# Patient Record
Sex: Female | Born: 1945 | Race: White | Hispanic: No | Smoking: Former smoker
Health system: Southern US, Community
[De-identification: ages and names within clinical notes are randomized; demographics above are authoritative.]

## PROBLEM LIST (undated history)

## (undated) DIAGNOSIS — E739 Lactose intolerance, unspecified: Secondary | ICD-10-CM

## (undated) DIAGNOSIS — Z8719 Personal history of other diseases of the digestive system: Secondary | ICD-10-CM

## (undated) DIAGNOSIS — K589 Irritable bowel syndrome without diarrhea: Secondary | ICD-10-CM

## (undated) DIAGNOSIS — Z9221 Personal history of antineoplastic chemotherapy: Secondary | ICD-10-CM

## (undated) DIAGNOSIS — C801 Malignant (primary) neoplasm, unspecified: Secondary | ICD-10-CM

## (undated) DIAGNOSIS — C50411 Malignant neoplasm of upper-outer quadrant of right female breast: Principal | ICD-10-CM

## (undated) DIAGNOSIS — C50919 Malignant neoplasm of unspecified site of unspecified female breast: Secondary | ICD-10-CM

## (undated) DIAGNOSIS — Z923 Personal history of irradiation: Secondary | ICD-10-CM

## (undated) HISTORY — DX: Malignant neoplasm of upper-outer quadrant of right female breast: C50.411

## (undated) HISTORY — PX: BREAST SURGERY: SHX581

## (undated) HISTORY — DX: Malignant neoplasm of unspecified site of unspecified female breast: C50.919

## (undated) HISTORY — PX: COLONOSCOPY: SHX5424

---

## 1998-04-12 ENCOUNTER — Encounter: Payer: Self-pay | Admitting: Family Medicine

## 1998-04-12 ENCOUNTER — Ambulatory Visit (HOSPITAL_COMMUNITY): Admission: RE | Admit: 1998-04-12 | Discharge: 1998-04-12 | Payer: Self-pay | Admitting: Obstetrics and Gynecology

## 2001-03-22 ENCOUNTER — Encounter: Payer: Self-pay | Admitting: Family Medicine

## 2001-03-22 ENCOUNTER — Ambulatory Visit (HOSPITAL_COMMUNITY): Admission: RE | Admit: 2001-03-22 | Discharge: 2001-03-22 | Payer: Self-pay | Admitting: Family Medicine

## 2004-12-25 ENCOUNTER — Encounter: Admission: RE | Admit: 2004-12-25 | Discharge: 2005-01-01 | Payer: Self-pay | Admitting: Family Medicine

## 2009-02-14 ENCOUNTER — Encounter: Admission: RE | Admit: 2009-02-14 | Discharge: 2009-03-14 | Payer: Self-pay | Admitting: Family Medicine

## 2010-01-14 ENCOUNTER — Encounter: Admission: RE | Admit: 2010-01-14 | Discharge: 2010-01-14 | Payer: Self-pay | Admitting: Family Medicine

## 2010-01-20 ENCOUNTER — Ambulatory Visit: Payer: Self-pay | Admitting: Pulmonary Disease

## 2010-01-20 DIAGNOSIS — R930 Abnormal findings on diagnostic imaging of skull and head, not elsewhere classified: Secondary | ICD-10-CM

## 2010-01-20 DIAGNOSIS — J019 Acute sinusitis, unspecified: Secondary | ICD-10-CM

## 2010-01-20 DIAGNOSIS — J309 Allergic rhinitis, unspecified: Secondary | ICD-10-CM | POA: Insufficient documentation

## 2010-07-06 ENCOUNTER — Encounter: Payer: Self-pay | Admitting: Pulmonary Disease

## 2010-07-06 ENCOUNTER — Encounter: Payer: Self-pay | Admitting: Obstetrics and Gynecology

## 2010-07-15 NOTE — Assessment & Plan Note (Signed)
Summary: CT-R LUNG APEX-NOD SCARRING//kp   Visit Type:  Initial Consult Copy to:  Dr. Vianne Bulls Primary Duanne Duchesne/Referring Malaijah Houchen:  Dr. Vianne Bulls  CC:  Pulmonary consult for abnormal CT chest.  The patient c/o prod cough with clear mucus. Worse in the mornings..  History of Present Illness: 65 yo female with abnormal CT chest.  She developed a sinus infection about 2 or 3 weeks ago.  She initially felt like she was getting a cold.  She had sinus pressure and drainage.  She also had a cough with chest congestion.  She was treated with augmentin and prednisone.  She initally was given zithromax, but this caused stomach upset.  She was also getting some noise in her chest with her breathing.  Overall she has improved.  She still has some nasal congestion and a nasally voice.  She gets drainage from her sinuses in the morning still.  She still also feels fatigued.  She denies chest pain, fever, epistaxis, hemoptysis, palpitations, headache, weight loss, dysphagia, skin rash, joint pain, or leg swelling.  She had an episode of pneumonia 20 years ago, but denies TB.  There is no prior history of asthma, but she does get allergies with the change of seasons.  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  She denies any recent travel history or sick exposures.  She denies any animal exposures.  She smoked 1/4 pack per day from age 67, and quit in 2008.  She will occasionally still smoke a cigarette, more so since her husband passed away.  She does exercise on a regular basis w/o difficulty.    As a result of her symptoms she had a chest xray and then CT chest.   CT of Chest  Procedure date:  01/14/2010  Findings:       CT CHEST WITH CONTRAST    Technique:  Multidetector CT imaging of the chest was performed   following the standard protocol during bolus administration of   intravenous contrast.    Contrast: 75 ml Omnipaque-300    Comparison: Chest x-ray of  01/08/2010.    Findings: There is biapical pleuroparenchymal scarring which is   more prominent within the right lung apex.  A right apical lesion   is difficult to exclude, with a somewhat nodular component, and   either follow-up CT chest or PET CT would be recommended.  The   remainder of the lungs are well-aerated and no other pulmonary   nodule is seen.  No pleural effusion is noted.    On soft tissue window images the thyroid gland is unremarkable.  No   mediastinal or hilar adenopathy is seen.  The pulmonary arteries   and thoracic aorta opacify with no significant abnormality noted.   The portion of the upper abdomen that is visualized is   unremarkable. No bony abnormality is seen.    IMPRESSION:   Somewhat nodular appearance of the pleuroparenchymal scarring in   the right lung apex.  Cannot exclude right apical lesion.  Consider   either follow-up CT chest in 4 months to assess stability versus   PET CT to assess for metabolic activity.   Preventive Screening-Counseling & Management  Alcohol-Tobacco     Alcohol drinks/day: 2     Alcohol type: wine     Smoking Status: quit     Packs/Day: 0.25     Year Started: 1979     Year Quit: 2008  Current Medications (verified):  1)  Caltrate 600+d 600-400 Mg-Unit Tabs (Calcium Carbonate-Vitamin D) .Marland Kitchen.. 1 By Mouth Two Times A Day  Allergies (verified): 1)  ! Jonne Ply  Past History:  Past Medical History: Seasonal allergies  Past Surgical History: none  Family History: Family History MI/Heart Attack---mother and father Family History C V A / Stroke ---mother  Social History: Patient states former smoker.  Widowed Currently unemployed Lives aloneAlcohol drinks/day:  2 Smoking Status:  quit Packs/Day:  0.25  Review of Systems       The patient complains of productive cough, nasal congestion/difficulty breathing through nose, and sneezing.  The patient denies shortness of breath with activity, shortness of breath at  rest, non-productive cough, coughing up blood, chest pain, irregular heartbeats, acid heartburn, indigestion, loss of appetite, weight change, abdominal pain, difficulty swallowing, sore throat, tooth/dental problems, headaches, itching, ear ache, anxiety, depression, hand/feet swelling, joint stiffness or pain, rash, change in color of mucus, and fever.    Vital Signs:  Patient profile:   65 year old female Height:      66 inches (167.64 cm) Weight:      126 pounds (57.27 kg) BMI:     20.41 O2 Sat:      96 % on Room air Temp:     98.5 degrees F (36.94 degrees C) oral Pulse rate:   66 / minute BP sitting:   140 / 84  (right arm) Cuff size:   regular  Vitals Entered By: Michel Bickers CMA (January 20, 2010 3:24 PM)  O2 Sat at Rest %:  96 O2 Flow:  Room air CC: Pulmonary consult for abnormal CT chest.  The patient c/o prod cough with clear mucus. Worse in the mornings. Is Patient Diabetic? No Comments Medications reviewed with the patient. Daytime phone verified. Michel Bickers CMA  January 20, 2010 3:25 PM   Physical Exam  General:  well developed, well nourished, in no acute distress Eyes:  PERRLA/EOM intact; conjunctiva and sclera clear Ears:  TMs intact and clear with normal canals Nose:  clear discharge, no tenderness Mouth:  no deformity or lesions Neck:  no JVD.   Chest Wall:  no deformities noted Lungs:  clear bilaterally to auscultation and percussion Heart:  regular rate and rhythm, S1, S2 without murmurs, rubs, gallops, or clicks Abdomen:  bowel sounds positive; abdomen soft and non-tender without masses, or organomegaly Msk:  no deformity or scoliosis noted with normal posture Pulses:  pulses normal Extremities:  no clubbing, cyanosis, edema, or deformity noted Neurologic:  normal CN II-XII and strength normal.   Cervical Nodes:  no significant adenopathy Axillary Nodes:  no significant adenopathy Psych:  alert and cooperative; normal mood and affect; normal attention  span and concentration   Impression & Recommendations:  Problem # 1:  CT, CHEST, ABNORMAL (ICD-793.1) She has prior history of smoking.  She has CT chest findings as detailed above.  My suspicion is that this represents a benign lesion.  However, more serious pathology can not be completely excluded.  I do not think she requires biopsy at this time.  I have recommend radiographic monitoring.  Will repeat CT chest w/o contrast in 4 months.  Depending on imaging studies from then will decide if further interventions are needed.  Problem # 2:  SINUSITIS, ACUTE (ICD-461.9) She has slowly resolving sinus symptoms.  Will give her a trial of nasal irrigation and nasonex.  Have give sample of nasonex.  If no improvement, advised to follow up with primary  care for possible ENT evaluation.  I don't think she needs additional antibiotics or prednisone at this time.  Medications Added to Medication List This Visit: 1)  Caltrate 600+d 600-400 Mg-unit Tabs (Calcium carbonate-vitamin d) .Marland Kitchen.. 1 by mouth two times a day 2)  Nasonex 50 Mcg/act Susp (Mometasone furoate) .... Two sprays once daily for one to two weeks, then as needed  Complete Medication List: 1)  Caltrate 600+d 600-400 Mg-unit Tabs (Calcium carbonate-vitamin d) .Marland Kitchen.. 1 by mouth two times a day 2)  Nasonex 50 Mcg/act Susp (Mometasone furoate) .... Two sprays once daily for one to two weeks, then as needed  Other Orders: Consultation Level IV (30865) Radiology Referral (Radiology)  Patient Instructions: 1)  Nasal irrigation (saline nasal rinse) once daily  2)  Nasonex two sprays once daily after nasal irrigation.  Use this once daily for one to two weeks, then as needed  3)  CT chest in 4 months 4)  Follow up after CT chest in 4 months Prescriptions: NASONEX 50 MCG/ACT SUSP (MOMETASONE FUROATE) two sprays once daily for one to two weeks, then as needed  #1 x 3   Entered and Authorized by:   Coralyn Helling MD   Signed by:   Coralyn Helling MD on  01/20/2010   Method used:   Print then Give to Patient   RxID:   [id number]

## 2013-01-15 DIAGNOSIS — J069 Acute upper respiratory infection, unspecified: Secondary | ICD-10-CM | POA: Diagnosis not present

## 2013-01-20 DIAGNOSIS — J40 Bronchitis, not specified as acute or chronic: Secondary | ICD-10-CM | POA: Diagnosis not present

## 2013-03-30 DIAGNOSIS — N61 Mastitis without abscess: Secondary | ICD-10-CM | POA: Diagnosis not present

## 2013-04-10 DIAGNOSIS — Z23 Encounter for immunization: Secondary | ICD-10-CM | POA: Diagnosis not present

## 2013-06-11 DIAGNOSIS — J069 Acute upper respiratory infection, unspecified: Secondary | ICD-10-CM | POA: Diagnosis not present

## 2013-06-11 DIAGNOSIS — R509 Fever, unspecified: Secondary | ICD-10-CM | POA: Diagnosis not present

## 2013-06-16 DIAGNOSIS — J40 Bronchitis, not specified as acute or chronic: Secondary | ICD-10-CM | POA: Diagnosis not present

## 2013-07-26 DIAGNOSIS — J329 Chronic sinusitis, unspecified: Secondary | ICD-10-CM | POA: Diagnosis not present

## 2014-01-08 DIAGNOSIS — H04129 Dry eye syndrome of unspecified lacrimal gland: Secondary | ICD-10-CM | POA: Diagnosis not present

## 2014-01-19 DIAGNOSIS — H109 Unspecified conjunctivitis: Secondary | ICD-10-CM | POA: Diagnosis not present

## 2014-04-13 DIAGNOSIS — Z23 Encounter for immunization: Secondary | ICD-10-CM | POA: Diagnosis not present

## 2014-05-30 DIAGNOSIS — J329 Chronic sinusitis, unspecified: Secondary | ICD-10-CM | POA: Diagnosis not present

## 2014-05-30 DIAGNOSIS — J45909 Unspecified asthma, uncomplicated: Secondary | ICD-10-CM | POA: Diagnosis not present

## 2014-07-11 DIAGNOSIS — B029 Zoster without complications: Secondary | ICD-10-CM | POA: Diagnosis not present

## 2014-07-11 DIAGNOSIS — H6120 Impacted cerumen, unspecified ear: Secondary | ICD-10-CM | POA: Diagnosis not present

## 2014-07-26 SURGERY — Surgical Case
Anesthesia: *Unknown

## 2014-09-18 ENCOUNTER — Other Ambulatory Visit: Payer: Self-pay | Admitting: Family Medicine

## 2014-09-18 DIAGNOSIS — N63 Unspecified lump in unspecified breast: Secondary | ICD-10-CM

## 2014-09-19 ENCOUNTER — Ambulatory Visit
Admission: RE | Admit: 2014-09-19 | Discharge: 2014-09-19 | Disposition: A | Discharge status: Home / Self Care | Payer: Medicare Other | Source: Ambulatory Visit | Attending: Family Medicine | Admitting: Family Medicine

## 2014-09-19 ENCOUNTER — Other Ambulatory Visit: Payer: Self-pay | Admitting: Family Medicine

## 2014-09-19 DIAGNOSIS — N63 Unspecified lump in unspecified breast: Secondary | ICD-10-CM

## 2014-10-04 ENCOUNTER — Ambulatory Visit
Admission: RE | Admit: 2014-10-04 | Discharge: 2014-10-04 | Disposition: A | Discharge status: Home / Self Care | Payer: Medicare Other | Source: Ambulatory Visit | Attending: Family Medicine | Admitting: Family Medicine

## 2014-10-04 ENCOUNTER — Other Ambulatory Visit: Payer: Self-pay | Admitting: Family Medicine

## 2014-10-04 DIAGNOSIS — N63 Unspecified lump in unspecified breast: Secondary | ICD-10-CM

## 2014-10-04 DIAGNOSIS — N6310 Unspecified lump in the right breast, unspecified quadrant: Secondary | ICD-10-CM

## 2014-10-16 ENCOUNTER — Ambulatory Visit
Admission: RE | Admit: 2014-10-16 | Discharge: 2014-10-16 | Disposition: A | Discharge status: Home / Self Care | Payer: Medicare Other | Source: Ambulatory Visit | Attending: Family Medicine | Admitting: Family Medicine

## 2014-10-16 ENCOUNTER — Other Ambulatory Visit: Payer: Self-pay | Admitting: Family Medicine

## 2014-10-16 DIAGNOSIS — N6310 Unspecified lump in the right breast, unspecified quadrant: Secondary | ICD-10-CM

## 2014-10-16 DIAGNOSIS — N63 Unspecified lump in breast: Secondary | ICD-10-CM | POA: Diagnosis not present

## 2014-10-16 DIAGNOSIS — C50811 Malignant neoplasm of overlapping sites of right female breast: Secondary | ICD-10-CM | POA: Diagnosis not present

## 2014-10-16 HISTORY — PX: BREAST BIOPSY: SHX20

## 2014-10-17 ENCOUNTER — Other Ambulatory Visit: Payer: Self-pay | Admitting: Family Medicine

## 2014-10-17 DIAGNOSIS — C50911 Malignant neoplasm of unspecified site of right female breast: Secondary | ICD-10-CM

## 2014-10-19 ENCOUNTER — Encounter: Payer: Self-pay | Admitting: *Deleted

## 2014-10-19 ENCOUNTER — Telehealth: Payer: Self-pay | Admitting: *Deleted

## 2014-10-19 DIAGNOSIS — Z170 Estrogen receptor positive status [ER+]: Secondary | ICD-10-CM

## 2014-10-19 DIAGNOSIS — C50411 Malignant neoplasm of upper-outer quadrant of right female breast: Secondary | ICD-10-CM | POA: Insufficient documentation

## 2014-10-19 HISTORY — DX: Malignant neoplasm of upper-outer quadrant of right female breast: C50.411

## 2014-10-19 NOTE — Telephone Encounter (Signed)
Confirmed [facility] for 10/24/14 at 1230 .  Instructions and contact information given.

## 2014-10-23 ENCOUNTER — Ambulatory Visit
Admission: RE | Admit: 2014-10-23 | Discharge: 2014-10-23 | Disposition: A | Discharge status: Home / Self Care | Payer: Medicare Other | Source: Ambulatory Visit | Attending: Family Medicine | Admitting: Family Medicine

## 2014-10-23 DIAGNOSIS — C50111 Malignant neoplasm of central portion of right female breast: Secondary | ICD-10-CM | POA: Diagnosis not present

## 2014-10-23 DIAGNOSIS — C50911 Malignant neoplasm of unspecified site of right female breast: Secondary | ICD-10-CM

## 2014-10-23 MED ORDER — GADOBENATE DIMEGLUMINE 529 MG/ML IV SOLN
12.0000 mL | Freq: Once | INTRAVENOUS | Status: AC | PRN
  Administered 2014-10-23: 12 mL via INTRAVENOUS

## 2014-10-24 ENCOUNTER — Other Ambulatory Visit: Payer: Self-pay | Admitting: General Surgery

## 2014-10-24 ENCOUNTER — Encounter: Payer: Self-pay | Admitting: Oncology

## 2014-10-24 ENCOUNTER — Encounter: Payer: Self-pay | Admitting: Radiation Oncology

## 2014-10-24 ENCOUNTER — Ambulatory Visit
Admission: RE | Admit: 2014-10-24 | Discharge: 2014-10-24 | Disposition: A | Discharge status: Home / Self Care | Payer: Self-pay | Source: Ambulatory Visit | Attending: Radiation Oncology | Admitting: Radiation Oncology

## 2014-10-24 ENCOUNTER — Other Ambulatory Visit (HOSPITAL_BASED_OUTPATIENT_CLINIC_OR_DEPARTMENT_OTHER): Payer: Medicare Other

## 2014-10-24 ENCOUNTER — Ambulatory Visit: Payer: Medicare Other

## 2014-10-24 ENCOUNTER — Encounter: Payer: Self-pay | Admitting: Physical Therapy

## 2014-10-24 ENCOUNTER — Ambulatory Visit (HOSPITAL_BASED_OUTPATIENT_CLINIC_OR_DEPARTMENT_OTHER): Payer: Medicare Other | Admitting: Oncology

## 2014-10-24 ENCOUNTER — Other Ambulatory Visit: Payer: Self-pay | Admitting: Oncology

## 2014-10-24 ENCOUNTER — Other Ambulatory Visit: Payer: Self-pay | Admitting: *Deleted

## 2014-10-24 ENCOUNTER — Ambulatory Visit: Payer: Medicare Other | Attending: General Surgery | Admitting: Physical Therapy

## 2014-10-24 ENCOUNTER — Telehealth: Payer: Self-pay | Admitting: Oncology

## 2014-10-24 VITALS — BP 169/72 | HR 58 | Temp 97.9°F | Resp 18 | Ht 66.0 in | Wt 133.2 lb

## 2014-10-24 DIAGNOSIS — C50411 Malignant neoplasm of upper-outer quadrant of right female breast: Secondary | ICD-10-CM

## 2014-10-24 DIAGNOSIS — R293 Abnormal posture: Secondary | ICD-10-CM | POA: Insufficient documentation

## 2014-10-24 DIAGNOSIS — Z170 Estrogen receptor positive status [ER+]: Secondary | ICD-10-CM | POA: Diagnosis not present

## 2014-10-24 DIAGNOSIS — C50911 Malignant neoplasm of unspecified site of right female breast: Secondary | ICD-10-CM

## 2014-10-24 DIAGNOSIS — C50811 Malignant neoplasm of overlapping sites of right female breast: Secondary | ICD-10-CM

## 2014-10-24 DIAGNOSIS — I427 Cardiomyopathy due to drug and external agent: Secondary | ICD-10-CM

## 2014-10-24 DIAGNOSIS — T451X5A Adverse effect of antineoplastic and immunosuppressive drugs, initial encounter: Secondary | ICD-10-CM

## 2014-10-24 LAB — CBC WITH DIFFERENTIAL/PLATELET
BASO%: 0.6 % (ref 0.0–2.0)
Basophils Absolute: 0 10*3/uL (ref 0.0–0.1)
EOS%: 0.6 % (ref 0.0–7.0)
Eosinophils Absolute: 0 10*3/uL (ref 0.0–0.5)
HCT: 46.2 % (ref 34.8–46.6)
HGB: 15.5 g/dL (ref 11.6–15.9)
LYMPH#: 1.5 10*3/uL (ref 0.9–3.3)
LYMPH%: 27.4 % (ref 14.0–49.7)
MCH: 31.9 pg (ref 25.1–34.0)
MCHC: 33.7 g/dL (ref 31.5–36.0)
MCV: 94.7 fL (ref 79.5–101.0)
MONO#: 0.4 10*3/uL (ref 0.1–0.9)
MONO%: 6.4 % (ref 0.0–14.0)
NEUT#: 3.6 10*3/uL (ref 1.5–6.5)
NEUT%: 65 % (ref 38.4–76.8)
Platelets: 258 10*3/uL (ref 145–400)
RBC: 4.88 10*6/uL (ref 3.70–5.45)
RDW: 13.1 % (ref 11.2–14.5)
WBC: 5.5 10*3/uL (ref 3.9–10.3)

## 2014-10-24 LAB — COMPREHENSIVE METABOLIC PANEL (CC13)
ALK PHOS: 65 U/L (ref 40–150)
ALT: 10 U/L (ref 0–55)
AST: 16 U/L (ref 5–34)
Albumin: 4.2 g/dL (ref 3.5–5.0)
Anion Gap: 9 mEq/L (ref 3–11)
BUN: 13.1 mg/dL (ref 7.0–26.0)
CO2: 24 mEq/L (ref 22–29)
CREATININE: 0.8 mg/dL (ref 0.6–1.1)
Calcium: 9.6 mg/dL (ref 8.4–10.4)
Chloride: 106 mEq/L (ref 98–109)
EGFR: 73 mL/min/{1.73_m2} — AB (ref >90)
Glucose: 116 mg/dl (ref 70–140)
Potassium: 3.8 mEq/L (ref 3.5–5.1)
Sodium: 139 mEq/L (ref 136–145)
Total Bilirubin: 0.5 mg/dL (ref 0.20–1.20)
Total Protein: 6.7 g/dL (ref 6.4–8.3)

## 2014-10-24 NOTE — Patient Instructions (Signed)

## 2014-10-24 NOTE — Progress Notes (Signed)
Checked in new pt with no financial concerns.  Pt has 2 insurances so financial assistance may not be needed but she has my card for any billing questions or concerns.  ° °

## 2014-10-24 NOTE — Telephone Encounter (Signed)
Appointments made and avs printed for patient,patient has not come out yet and will give to her or will mail if needed

## 2014-10-24 NOTE — Progress Notes (Signed)
Radiation Oncology         [phone] ________________________________  Initial outpatient Consultation  Name: Diana Dunn MRN: [id number]  Date: 10/24/2014  DOB: 04-Sep-1945  CC: Melinda Crutch, MD  No ref. 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 female who presented with a palpable right breast mass which was tender with associated erythema. A year ago she had a similar presentation that responded to antibiotics. She underwent imaging including mammography and ultrasound which showed a 2.4 cm (2.9 cm the second time)// mass in the right breast. After antibiotics the mass did not change significantly. Biopsy revealed invasive ductal carcinoma of the right breast, Grade 2-3 triple positive, upper outer quadrant. MRI showed a solitary 3.1 cm mass in this area.   There is an ecchymosis that takes up the majority of the right breast.  Her husband was treated with radiation for his melanoma so she is generally aware of the procedure. Due to her husband being treated here she is experiencing a little bit of anxiety. She lives in [place].    PREVIOUS RADIATION THERAPY: No  PAST MEDICAL HISTORY:  has a past medical history of Breast cancer of upper-outer quadrant of right female breast (10/19/2014) and Breast cancer.    PAST SURGICAL HISTORY:No past surgical history on file.  FAMILY HISTORY: family history is not on file.  SOCIAL HISTORY:  reports that she has quit smoking. She does not have any smokeless tobacco history on file. She reports that she drinks alcohol. She reports that she does not use illicit drugs.  ALLERGIES: Aspirin  MEDICATIONS:  Current Outpatient Prescriptions  Medication Sig Dispense Refill  . hyoscyamine (LEVSIN, ANASPAZ) 0.125 MG tablet Take  0.125 mg by mouth every 4 (four) hours as needed.     No current facility-administered medications for this visit.    REVIEW OF SYSTEMS:  Notable for that above.   PHYSICAL EXAM:   Vitals with Age-Percentiles 10/24/2014  Length 833.3 cm  Systolic 832  Diastolic 72  Pulse 58  Respiration 18  Weight 60.419 kg  BMI 21.5  VISIT REPORT    General: Alert and oriented, in no acute distress HEENT: Head is normocephalic. Extraocular movements are intact. Oropharynx is clear. Neck: Neck is supple, no palpable cervical or supraclavicular lymphadenopathy. Heart: Regular in rate and rhythm with no murmurs, rubs, or gallops. Chest: Clear to auscultation bilaterally, with no rhonchi, wheezes, or rales. Abdomen: Soft, nontender, nondistended, with no rigidity or guarding. Extremities: No cyanosis or edema. Lymphatics: see Neck Exam Skin: No concerning lesions. Musculoskeletal: symmetric strength and muscle tone throughout. Neurologic: Cranial nerves II through XII are grossly intact. No obvious focalities. Speech is fluent. Coordination is intact. Psychiatric: Judgment and insight are intact. Affect is appropriate. Breast: At the 9 o'clock position of the right breast can palpate 3 cm of post biopsy firmness . Extensive ecchymosis around the right breast laterally and inferiorly. Breast does not have a p'eau d'orange appearance. No palpable lymph adenopathy in the right axilla. Left breast is without palpable abnormalities or lymph adenopathy.   ECOG = 0  0 - Asymptomatic (Fully active, able to carry on all predisease activities without restriction)  1 - Symptomatic but completely ambulatory (Restricted in physically strenuous activity but ambulatory and able to carry out work  of a light or sedentary nature. For example, light housework, office work)  2 - Symptomatic, <50% in bed during the day (Ambulatory and capable of all self care but unable to carry out any work activities. Up and about  more than 50% of waking hours)  3 - Symptomatic, >50% in bed, but not bedbound (Capable of only limited self-care, confined to bed or chair 50% or more of waking hours)  4 - Bedbound (Completely disabled. Cannot carry on any self-care. Totally confined to bed or chair)  5 - Death   Eustace Pen MM, Creech RH, Tormey DC, et al. [phone]). "Toxicity and response criteria of the [facility]". [place] Oncol. 5 (6): 649-55   LABORATORY DATA:  Lab Results  Component Value Date   WBC 5.5 10/24/2014   HGB 15.5 10/24/2014   HCT 46.2 10/24/2014   MCV 94.7 10/24/2014   PLT 258 10/24/2014   CMP     Component Value Date/Time   NA 139 10/24/2014 1244   K 3.8 10/24/2014 1244   CO2 24 10/24/2014 1244   GLUCOSE 116 10/24/2014 1244   BUN 13.1 10/24/2014 1244   CREATININE 0.8 10/24/2014 1244   CALCIUM 9.6 10/24/2014 1244   PROT 6.7 10/24/2014 1244   ALBUMIN 4.2 10/24/2014 1244   AST 16 10/24/2014 1244   ALT 10 10/24/2014 1244   ALKPHOS 65 10/24/2014 1244   BILITOT 0.50 10/24/2014 1244         RADIOGRAPHY: Mr Breast Bilateral W Wo Contrast  10/23/2014   CLINICAL DATA:  Recently diagnosed right breast invasive ductal carcinoma. Preoperative evaluation.  LABS:  BUN and creatinine were obtained on site at [facility] at  [address]  Results:  BUN fourteen mg/dL,  Creatinine 0.9 mg/dL.  EXAM: BILATERAL BREAST MRI WITH AND WITHOUT CONTRAST  TECHNIQUE: Multiplanar, multisequence MR images of both breasts were obtained prior to and following the intravenous administration of 12 ml of MultiHance  THREE-DIMENSIONAL MR IMAGE RENDERING ON INDEPENDENT WORKSTATION:  Three-dimensional MR images were rendered by post-processing of the original MR data on an independent workstation. The three-dimensional MR images were interpreted, and findings are reported in the following complete MRI report for this study. Three dimensional images were evaluated at the independent DynaCad  workstation  COMPARISON:  Previous exam(s).  FINDINGS: Breast composition: b.  Scattered fibroglandular tissue.  Background parenchymal enhancement: Moderate.  Right breast: There is an irregular enhancing mass with central clip artifact located within the right breast at the 9:30 o'clock position (anterior 1/3) corresponding to the recently diagnosed right breast invasive ductal carcinoma. This measures 3.1 x 2.2 x 1.6 cm in size and is associated with washout enhancement kinetics. There are no additional worrisome enhancing foci within the right breast.  Left breast: No mass or abnormal enhancement.  Lymph nodes: No abnormal appearing lymph nodes.  Ancillary findings:  None.  IMPRESSION: 3.1 cm irregular enhancing mass located within the right breast at the 9:30 o'clock position corresponding to the recently diagnosed right breast invasive ductal carcinoma. No additional findings.  RECOMMENDATION: Treatment plan.  BI-RADS CATEGORY  6: Known biopsy-proven malignancy.   Electronically Signed   By: Altamese Cabal M.D.   On: 10/23/2014 12:06   Mm Digital Diagnostic Unilat R  10/16/2014   CLINICAL DATA:  Post biopsy clip mammograms.  EXAM: DIAGNOSTIC RIGHT MAMMOGRAM POST ULTRASOUND BIOPSY  COMPARISON:  Previous exam(s).  FINDINGS: Mammographic images were obtained following ultrasound guided biopsy of a right breast mass.  The coil shaped biopsy clip lies within the mass in the lateral right breast.  IMPRESSION: Well-positioned coil shaped biopsy clip following right breast ultrasound-guided core needle biopsy.  Final Assessment: Post Procedure Mammograms for Marker Placement   Electronically Signed   By: Lajean Manes M.D.   On: 10/16/2014 13:52   US Breast Ltd Uni Right Inc Axilla  10/16/2014   CLINICAL DATA:  Patient returns for repeat right breast ultrasound. Patient had a suspected phlegmon/ abscess in the upper outer right breast. This does not improve with the initial antibiotic treatment. Patient  underwent a second antibiotic course with a different antibiotic and now returns for reassessment.  EXAM: ULTRASOUND OF THE RIGHT BREAST  COMPARISON:  Previous exam(s).  FINDINGS: On physical exam, palpable mass lateral to the right nipple is again noted.  Targeted ultrasound is performed, showing a hypoechoic lobulated mass in the 9 o'clock position of the right breast, 2 cm the nipple, now measuring 2.9 cm x 1.5 cm x 2.1 cm, previously 2.6 cm x 1.8 cm x 2.4 cm.  IMPRESSION: Hypoechoic lobulated mass in the right breast. This has not improved with antibiotic therapy. Although it may still reflect an area of inflammation/infection, neoplastic disease must be excluded. Biopsy is recommended.  RECOMMENDATION: Ultrasound-guided core needle biopsy will be performed today.  I have discussed the findings and recommendations with the patient. Results were also provided in writing at the conclusion of the visit. If applicable, a reminder letter will be sent to the patient regarding the next appointment.  BI-RADS CATEGORY  4: Suspicious abnormality - biopsy should be considered.   Electronically Signed   By: Lajean Manes M.D.   On: 10/16/2014 10:55   US Breast Ltd Uni Right Inc Axilla  10/04/2014   CLINICAL DATA:  Patient returns following a course of Keflex antibiotic treatment for a presumed right breast infection. Patient's symptoms did improve, but over the last 2 days pain has recurred. There still palpable mass lateral to the right nipple.  EXAM: ULTRASOUND OF THE RIGHT BREAST  COMPARISON:  Previous exam(s).  FINDINGS: On physical exam, there is a palpable lobulated firm tender mass lateral to the right nipple.  Targeted ultrasound is performed, showing a lobulated hypoechoic mass measuring 2.6 cm x 1.8 cm x 2.4 cm in size. Mass shows internal blood flow. This may reflect an organizing infection/abscess multiple, most likely given the history. Mass is unchanged in overall size from the prior ultrasound.   IMPRESSION: Probable abscess, now with internal blood flow suggesting granulation tissue, in the right breast, without change in overall size from the prior ultrasound.  RECOMMENDATION: Recommend an additional longer course of a different antibiotic. Will repeat the right breast ultrasound 2 weeks. If this mass is still unchanged in size, biopsy would be recommended.  I have discussed the findings and recommendations with the patient. Results were also provided in writing at the conclusion of the visit. If applicable, a reminder letter will be sent to the patient regarding the next appointment.  BI-RADS CATEGORY  3: Probably benign finding(s) - short interval follow-up suggested.   Electronically Signed   By: Lajean Manes M.D.   On: 10/04/2014 09:55   [place] Rt Breast Bx W Loc Dev 1st Lesion Img Bx Spec US Guide  10/18/2014   ADDENDUM REPORT: 10/18/2014 07:37  ADDENDUM: Pathology revealed grade II to III invasive ductal carcinoma in the right breast. This was found to be concordant by Dr. Lovey Newcomer. Pathology was discussed with the  patient and her son, Milta Deiters, by telephone. She reported doing well after the biopsy with tenderness and bruising at the site. Post biopsy instructions and care were reviewed and her questions were answered. She has been [facility] at [facility] on Oct 24, 2014. A bilateral breast MRI has been scheduled on Oct 23, 2014. She is encouraged to come to [facility] for educational materials. My number was provided for additional questions and concerns.  Pathology results reported by Susa Raring RN, BSN on Oct 18, 2014.   Electronically Signed   By: Lajean Manes M.D.   On: 10/18/2014 07:37   10/18/2014   CLINICAL DATA:  Patient returned for ultrasound-guided core needle biopsy of a lobulated hypoechoic mass in the lateral right breast.  EXAM: ULTRASOUND GUIDED RIGHT BREAST CORE NEEDLE BIOPSY  COMPARISON:  Previous exam(s).   PROCEDURE: I met with the patient and we discussed the procedure of ultrasound-guided biopsy, including benefits and alternatives. We discussed the high likelihood of a successful procedure. We discussed the risks of the procedure including infection, bleeding, tissue injury, clip migration, and inadequate sampling. Informed written consent was given. The usual time-out protocol was performed immediately prior to the procedure.  Using sterile technique and 2% Lidocaine as local anesthetic, under direct ultrasound visualization, a 12 gauge vacuum-assisted device was used to perform biopsy of the lateral right breast massusing a inferior, lateral approach. At the conclusion of the procedure, a coil shaped tissue marker clip was deployed into the biopsy cavity. Follow-up 2-view mammogram was performed and dictated separately.  IMPRESSION: Ultrasound-guided biopsy of a right breast mass. No apparent complications.  Electronically Signed: By: Lajean Manes M.D. On: 10/16/2014 13:51      IMPRESSION/PLAN: She has been discussed at our multidisciplinary tumor board.   Multidisciplinary plan is for neoadjuvant chemotherapy, then lumpectomy vs mastectomy. Then adjuvant radiotherapy to the right breast, if indicated.  It was a pleasure meeting the patient today. We discussed the indications for, risks, benefits, and side effects of radiotherapy. We discussed that radiation would take approximately 6 weeks to complete and that I would give the patient a few weeks to heal following surgery before starting treatment planning. We spoke about acute effects including skin irritation and fatigue as well as much less common late effects including lung and heart irritation. We spoke about the latest technology that is used to minimize the risk of late effects for breast cancer patients undergoing radiotherapy. No guarantees of treatment were given. The patient is enthusiastic about proceeding with treatment. I look forward to  participating in the patient's care if indicated; she understands RT would be recommended in breast conservation; in setting of post-mastectomy, review of final pathology is warranted to determine need for RT.   __________________________________________   Eppie Gibson, MD

## 2014-10-24 NOTE — Progress Notes (Signed)
Ms. Mccubbin is a very pleasant 70 y.o. female from [place], [place] with newly diagnosed grade 2-3 invasive ductal carcinoma of the right breast.  Biopsy results revealed the tumor's prognostic profile is ER positive, PR positive, and HER2/neu positive. Ki67 is 20%.  She presents today with her son and sister to the [facility]) for treatment consideration and recommendations from the breast surgeon, radiation oncologist, and medical oncologist.     I briefly met with Ms. Trahan and her family during her [facility] visit today. We discussed the purpose of the Survivorship Clinic, which will include monitoring for recurrence, coordinating completion of age and gender-appropriate cancer screenings, promotion of overall wellness, as well as managing potential late/long-term side effects of anti-cancer treatments.    The treatment plan for Ms. Baksh will initially include neoadjuvant chemotherapy and her surgery is to be determined.  She will receive adjuvant estrogen therapy as well.  As of today, the intent of treatment for Ms. Laforte is cure, therefore she will be eligible for the Survivorship Clinic upon her completion of treatment.  Her survivorship care plan (SCP) document will be drafted and updated throughout the course of her treatment trajectory. She will receive the SCP in an office visit with myself in the Survivorship Clinic once she has completed treatment.   Ms. Scaffidi was encouraged to ask questions and all questions were answered to her satisfaction.  She was given my business card and encouraged to contact me with any concerns regarding survivorship.  I look forward to participating in her care.   Mike Craze, NP [place] [phone]

## 2014-10-24 NOTE — Progress Notes (Signed)
[facility]  [phone] [phone]     ID: Diana Dunn DOB: 11-Mar-1946  MR#: [id number]  HYW#:[id number]  Patient Care Team: Diana Kettle, MD as PCP - General (Family Medicine) Diana Messing III, MD as Consulting Physician (General Surgery) Diana Cruel, MD as Consulting Physician (Oncology) Diana Gibson, MD as Attending Physician (Radiation Oncology) Diana Kaufmann, RN as Registered Nurse Diana Germany, RN as Registered Nurse PCP:  Diana Crutch, MD OTHER MD:  CHIEF COMPLAINT: Triple positive breast cancer  CURRENT TREATMENT: Chemotherapy and anti-HER-2 immunotherapy   BREAST CANCER HISTORY: Diana Dunn had not had a mammogram for approximately 4 years. Sometime mid 2015 she had an area of redness and tenderness in the right breast and she brought this to her physician's attention. She was treated with antibiotics and this completely cleared.Marland Kitchen  Approximately mid April, she again developed redness over the right breast. This was very focal, and it did not look "as red" as the previous time. She brought it to Diana Dunn is attention, and was treated with antibiotics. Bilateral mammography with tomosynthesis and right breast ultrasonography was also obtained on 09/19/2014. There was a focal opacity with irregular margins in the upper outer right breast with some central calcifications. There was mild architectural distortion associated with this. On exam Diana Dunn noted a firm palpable tender mass lateral to the right areola with overlying erythema. On ultrasonography there was an irregular hypoechoic mass measuring 2.4 cm.  Repeat right breast ultrasonography after the patient completed her antibiotic course was performed 10/04/2014. On exam there was still a palpable lobulated mass lateral to the right nipple and by ultrasound this measured 2.6 cm. It was felt to be a possible abscess and a second course of antibiotics, now with Septra, was undertaken.  After that treatment, repeat ultrasonography 10/16/2014 continued to show the palpable mass and ultrasonography now found the mass to measure 2.9 cm maximally.  Accordingly biopsy of the mass in question was obtained that same day, 10/16/2014, and showed ([id number]) and invasive ductal carcinoma, grade 2 or 3, estrogen receptor 90% positive, progesterone receptor 80% positive, both with strong staining intensity, with an MIB-1 of 20%, and with HER-2 amplification, the signals ratio being 3.60 and the number per cell 4.50.  Bilateral breast MRI 10/23/2014 showed a breast density to be category B. In the right breast there was an irregular enhancing mass at the 9:30 o'clock position measuring 3.1 cm. There were no additional worrisome masses in the right breast, no findings of concern in the left breast, and no abnormal appearing lymph nodes.  The patient's subsequent history is as detailed below  INTERVAL HISTORY: [name] was evaluated in the multidisciplinary breast cancer clinic 10/24/2014 accompanied by her [name] and her sister-in-law Diana Dunn. Her case was also presented at the multidisciplinary breast cancer conference that same morning. At that conference a preliminary plan was proposed for neoadjuvant therapy to be followed if possible by breast conserving surgery, radiation, and anti-estrogens.  REVIEW OF SYSTEMS: Aside from the mass itself, there were no specific symptoms leading to the original mammogram, which was routinely scheduled. The patient denies unusual headaches, visual changes, nausea, vomiting, stiff neck, dizziness, or gait imbalance. There has been no cough, phlegm production, or pleurisy, no chest pain or pressure, and no change in bowel or bladder habits. The patient denies fever, rash, bleeding, unexplained fatigue or unexplained weight loss. Diana Dunn takes walks almost every day in addition to walking the dog, Diana Dunn, almost every  day (Neals boxer, all over, unfortunately died  recently from non-Hodgkin's lymphoma). A detailed review of systems was otherwise entirely negative.   PAST MEDICAL HISTORY: Past Medical History  Diagnosis Date  . Breast cancer of upper-outer quadrant of right female breast 10/19/2014  . Breast cancer   Irritable bowel symptoms  PAST SURGICAL HISTORY: History reviewed. No pertinent past surgical history.  FAMILY HISTORY History reviewed. No pertinent family history. The patient's father died at the age of 25 from a myocardial infarction. The patient's mother died at the age of 3 following a stroke. The patient had 2 brothers. One had Down's syndrome. She had no sisters. The only breast cancer in the family was the patient's father's only sister was diagnosed with breast cancer in her 48s. There is no history of ovarian cancer in the family to the patient's knowledge   GYNECOLOGIC HISTORY:  No LMP recorded.  menarche age 69, first live birth age 57, she is Diana Dunn P2. She went through the change of life in late 1999. She did not take hormone replacement. She took birth control pills for less than 2 years in her 71V, with no complications   SOCIAL HISTORY:  [name] is my neighbor. 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 Bill died from widely metastatic malignant melanoma within 4 months of diagnosis some years ago. The patient lives with her [name] and his wife, who is expecting their first child late November 2016. The other son, [name], lives in [place]. Both sons are appraised her's. The patient has 2 grandchildren and is just noted one on the way. The patient attends the [employer]: Not in place. At the 10/24/2014 visit the patient was given the appropriate documents 2 complete and notarize at her discretion   HEALTH MAINTENANCE: History  Substance Use Topics  . Smoking status: Former Research scientist (life sciences)  . Smokeless tobacco: Not on file  . Alcohol Use: Yes      Colonoscopy: 2000?  PAP:  Bone density: On wrist, remote   Lipid panel:  Allergies  Allergen Reactions  . Aspirin Nausea Only    GI upset/pain    Current Outpatient Prescriptions  Medication Sig Dispense Refill  . hyoscyamine (LEVSIN, ANASPAZ) 0.125 MG tablet Take 0.125 mg by mouth every 4 (four) hours as needed.     No current facility-administered medications for this visit.    OBJECTIVE: middle-aged white woman who appears well  Filed Vitals:   10/24/14 1309  BP: 169/72  Pulse: 58  Temp: 97.9 F (36.6 C)  Resp: 18     Body mass index is 21.51 kg/(m^2).    ECOG FS:0 - Asymptomatic  Ocular: Sclerae unicteric, pupils round and equal Ear-nose-throat: Oropharynx clear, dentition in good repair Lymphatic: No cervical or supraclavicular adenopathy Lungs no rales or rhonchi, good excursion bilaterally Heart regular rate and rhythm, no murmur appreciated Abd soft, nontender, positive bowel sounds MSK no focal spinal tenderness, no joint edema Neuro: non-focal, well-oriented, appropriate affect Breasts: The right breast is status post recent biopsy. Approximately half the breast shows a significant ecchymosis. These small area of erythema was in the area of the current ecchymosis. The rest of the breast shows no erythema. The mass is easily palpable just lateral to the nipple on the right. It measures about 3 cm by palpation. It is easily movable. The right axilla is benign. The left breast is unremarkable.   LAB  RESULTS:  CMP     Component Value Date/Time   NA 139 10/24/2014 1244   K 3.8 10/24/2014 1244   CO2 24 10/24/2014 1244   GLUCOSE 116 10/24/2014 1244   BUN 13.1 10/24/2014 1244   CREATININE 0.8 10/24/2014 1244   CALCIUM 9.6 10/24/2014 1244   PROT 6.7 10/24/2014 1244   ALBUMIN 4.2 10/24/2014 1244   AST 16 10/24/2014 1244   ALT 10 10/24/2014 1244   ALKPHOS 65 10/24/2014 1244   BILITOT 0.50 10/24/2014 1244    INo results found for: SPEP, UPEP  Lab  Results  Component Value Date   WBC 5.5 10/24/2014   NEUTROABS 3.6 10/24/2014   HGB 15.5 10/24/2014   HCT 46.2 10/24/2014   MCV 94.7 10/24/2014   PLT 258 10/24/2014      Chemistry      Component Value Date/Time   NA 139 10/24/2014 1244   K 3.8 10/24/2014 1244   CO2 24 10/24/2014 1244   BUN 13.1 10/24/2014 1244   CREATININE 0.8 10/24/2014 1244      Component Value Date/Time   CALCIUM 9.6 10/24/2014 1244   ALKPHOS 65 10/24/2014 1244   AST 16 10/24/2014 1244   ALT 10 10/24/2014 1244   BILITOT 0.50 10/24/2014 1244       No results found for: LABCA2  No components found for: LABCA125  No results for input(s): INR in the last 168 hours.  Urinalysis No results found for: COLORURINE, APPEARANCEUR, LABSPEC, PHURINE, GLUCOSEU, HGBUR, BILIRUBINUR, KETONESUR, PROTEINUR, UROBILINOGEN, NITRITE, LEUKOCYTESUR  STUDIES: Mr Breast Bilateral W Wo Contrast  10/23/2014   CLINICAL DATA:  Recently diagnosed right breast invasive ductal carcinoma. Preoperative evaluation.  LABS:  BUN and creatinine were obtained on site at [facility] at  [address]  Results:  BUN fourteen mg/dL,  Creatinine 0.9 mg/dL.  EXAM: BILATERAL BREAST MRI WITH AND WITHOUT CONTRAST  TECHNIQUE: Multiplanar, multisequence MR images of both breasts were obtained prior to and following the intravenous administration of 12 ml of MultiHance  THREE-DIMENSIONAL MR IMAGE RENDERING ON INDEPENDENT WORKSTATION:  Three-dimensional MR images were rendered by post-processing of the original MR data on an independent workstation. The three-dimensional MR images were interpreted, and findings are reported in the following complete MRI report for this study. Three dimensional images were evaluated at the independent DynaCad workstation  COMPARISON:  Previous exam(s).  FINDINGS: Breast composition: b.  Scattered fibroglandular tissue.  Background parenchymal enhancement: Moderate.  Right breast: There is an irregular enhancing  mass with central clip artifact located within the right breast at the 9:30 o'clock position (anterior 1/3) corresponding to the recently diagnosed right breast invasive ductal carcinoma. This measures 3.1 x 2.2 x 1.6 cm in size and is associated with washout enhancement kinetics. There are no additional worrisome enhancing foci within the right breast.  Left breast: No mass or abnormal enhancement.  Lymph nodes: No abnormal appearing lymph nodes.  Ancillary findings:  None.  IMPRESSION: 3.1 cm irregular enhancing mass located within the right breast at the 9:30 o'clock position corresponding to the recently diagnosed right breast invasive ductal carcinoma. No additional findings.  RECOMMENDATION: Treatment plan.  BI-RADS CATEGORY  6: Known biopsy-proven malignancy.   Electronically Signed   By: Altamese Cabal M.D.   On: 10/23/2014 12:06   Mm Digital Diagnostic Unilat R  10/16/2014   CLINICAL DATA:  Post biopsy clip mammograms.  EXAM: DIAGNOSTIC RIGHT MAMMOGRAM POST ULTRASOUND BIOPSY  COMPARISON:  Previous exam(s).  FINDINGS: Mammographic images were  obtained following ultrasound guided biopsy of a right breast mass. The coil shaped biopsy clip lies within the mass in the lateral right breast.  IMPRESSION: Well-positioned coil shaped biopsy clip following right breast ultrasound-guided core needle biopsy.  Final Assessment: Post Procedure Mammograms for Marker Placement   Electronically Signed   By: Lajean Manes M.D.   On: 10/16/2014 13:52   US Breast Ltd Uni Right Inc Axilla  10/16/2014   CLINICAL DATA:  Patient returns for repeat right breast ultrasound. Patient had a suspected phlegmon/ abscess in the upper outer right breast. This does not improve with the initial antibiotic treatment. Patient underwent a second antibiotic course with a different antibiotic and now returns for reassessment.  EXAM: ULTRASOUND OF THE RIGHT BREAST  COMPARISON:  Previous exam(s).  FINDINGS: On physical exam, palpable mass  lateral to the right nipple is again noted.  Targeted ultrasound is performed, showing a hypoechoic lobulated mass in the 9 o'clock position of the right breast, 2 cm the nipple, now measuring 2.9 cm x 1.5 cm x 2.1 cm, previously 2.6 cm x 1.8 cm x 2.4 cm.  IMPRESSION: Hypoechoic lobulated mass in the right breast. This has not improved with antibiotic therapy. Although it may still reflect an area of inflammation/infection, neoplastic disease must be excluded. Biopsy is recommended.  RECOMMENDATION: Ultrasound-guided core needle biopsy will be performed today.  I have discussed the findings and recommendations with the patient. Results were also provided in writing at the conclusion of the visit. If applicable, a reminder letter will be sent to the patient regarding the next appointment.  BI-RADS CATEGORY  4: Suspicious abnormality - biopsy should be considered.   Electronically Signed   By: Lajean Manes M.D.   On: 10/16/2014 10:55   US Breast Ltd Uni Right Inc Axilla  10/04/2014   CLINICAL DATA:  Patient returns following a course of Keflex antibiotic treatment for a presumed right breast infection. Patient's symptoms did improve, but over the last 2 days pain has recurred. There still palpable mass lateral to the right nipple.  EXAM: ULTRASOUND OF THE RIGHT BREAST  COMPARISON:  Previous exam(s).  FINDINGS: On physical exam, there is a palpable lobulated firm tender mass lateral to the right nipple.  Targeted ultrasound is performed, showing a lobulated hypoechoic mass measuring 2.6 cm x 1.8 cm x 2.4 cm in size. Mass shows internal blood flow. This may reflect an organizing infection/abscess multiple, most likely given the history. Mass is unchanged in overall size from the prior ultrasound.  IMPRESSION: Probable abscess, now with internal blood flow suggesting granulation tissue, in the right breast, without change in overall size from the prior ultrasound.  RECOMMENDATION: Recommend an additional longer  course of a different antibiotic. Will repeat the right breast ultrasound 2 weeks. If this mass is still unchanged in size, biopsy would be recommended.  I have discussed the findings and recommendations with the patient. Results were also provided in writing at the conclusion of the visit. If applicable, a reminder letter will be sent to the patient regarding the next appointment.  BI-RADS CATEGORY  3: Probably benign finding(s) - short interval follow-up suggested.   Electronically Signed   By: Lajean Manes M.D.   On: 10/04/2014 09:55   [place] Rt Breast Bx W Loc Dev 1st Lesion Img Bx Spec US Guide  10/18/2014   ADDENDUM REPORT: 10/18/2014 07:37  ADDENDUM: Pathology revealed grade II to Dunn invasive ductal carcinoma in the right breast. This was found to be  concordant by Dr. Lovey Newcomer. Pathology was discussed with the patient and her son, Milta Deiters, by telephone. She reported doing well after the biopsy with tenderness and bruising at the site. Post biopsy instructions and care were reviewed and her questions were answered. She has been [facility] Oct [facility] 2014. A bilateral breast MRI has been scheduled on Oct 23, 2014. She is encouraged to come to [facility] for educational materials. My number was provided for additional questions and concerns.  Pathology results reported by Susa Raring RN, BSN on Oct 18, 2014.   Electronically Signed   By: Lajean Manes M.D.   On: 10/18/2014 07:37   10/18/2014   CLINICAL DATA:  Patient returned for ultrasound-guided core needle biopsy of a lobulated hypoechoic mass in the lateral right breast.  EXAM: ULTRASOUND GUIDED RIGHT BREAST CORE NEEDLE BIOPSY  COMPARISON:  Previous exam(s).  PROCEDURE: I met with the patient and we discussed the procedure of ultrasound-guided biopsy, including benefits and alternatives. We discussed the high likelihood of a successful procedure. We discussed the risks of the  procedure including infection, bleeding, tissue injury, clip migration, and inadequate sampling. Informed written consent was given. The usual time-out protocol was performed immediately prior to the procedure.  Using sterile technique and 2% Lidocaine as local anesthetic, under direct ultrasound visualization, a 12 gauge vacuum-assisted device was used to perform biopsy of the lateral right breast massusing a inferior, lateral approach. At the conclusion of the procedure, a coil shaped tissue marker clip was deployed into the biopsy cavity. Follow-up 2-view mammogram was performed and dictated separately.  IMPRESSION: Ultrasound-guided biopsy of a right breast mass. No apparent complications.  Electronically Signed: By: Lajean Manes M.D. On: 10/16/2014 13:51    ASSESSMENT: 69 y.o. [place] woman status post right breast biopsy 10/16/2014 for a clinical T3 N0, stage IIA invasive ductal carcinoma, grade 2 or 3, estrogen and progesterone receptor positive, with an MIB-1 of 20%, and HER-2 amplified, with a signals ratio of 3.60  (1) chemotherapy with anti-HER-2 immunotherapy to start 11/05/2014. This will consist of carboplatin, docetaxel, trastuzumab and pertuzumab given every 21 days 6, with Neulasta support  (2) trastuzumab will be continued to complete 1 year  (3) definitive surgery to follow chemotherapy  (4) anti-estrogens to follow surgery.  PLAN: We spent the better part of today's hour-long appointment discussing the biology of breast cancer in general, and the specifics of the patient's tumor in particular. We specifically discussed the difference between local and systemic therapy. From the point of view of systemic treatment, Jasslyn is a good candidate for all 3 modalities: Anti-estrogens, anti-HER-2 treatments, and chemotherapy. She understands that if her risk of recurrence were 42%, for example, after local treatment only, with standard treatment using these 3 systemic modalities the  risk would drop to approximately 7%.  We did discuss the possible toxicities, side effects and complications of chemotherapy. She will also come to "chemotherapy school" for further information. She will have an echocardiogram and she will have a port placed. We are hoping to be able to start chemotherapy on May 23. The plan would be for 6 cycles of carboplatin/Taxotere/perjeta/Herceptin, given every 21 days, with Neulasta support. After that she would continue Herceptin alone to total one year and she would proceed to definitive surgery.  We also discussed the fact that there is no difference in survival between starting with surgery or starting with chemotherapy. We are hoping that  the chemotherapy will be sufficiently effective that she may be able to keep her breast. Specifically we do not see evidence of inflammatory breast cancer. The plan accordingly would be to proceed to radiation after breast conserving surgery and then we would start anti-estrogens.  The patient has a good understanding of the overall plan. She agrees with it. She knows the goal of treatment in her case is cure. She will call with any problems that may develop before her next visit here.  Diana Cruel, MD   10/24/2014 4:02 PM Medical Oncology and Hematology [facility] [address] [place], [place] [address] Tel. [phone]    Fax. [phone]

## 2014-10-24 NOTE — Therapy (Signed)
[place], [place], [address] Phone: [phone]   Fax:  [phone]  Physical Therapy Evaluation  Patient Details  Name: Diana Dunn MRN: [id number] Date of Birth: 09-18-1945 Referring Provider:  Jovita Kussmaul, MD  Encounter Date: 10/24/2014      PT End of Session - 10/24/14 1624    Visit Number 1   Number of Visits 1   PT Start Time 1405   PT Stop Time 1415  Also saw pt 1520-1540   PT Time Calculation (min) 10 min   Activity Tolerance Patient tolerated treatment well   Behavior During Therapy [facility] for tasks assessed/performed      Past Medical History  Diagnosis Date  . Breast cancer of upper-outer quadrant of right female breast 10/19/2014  . Breast cancer     History reviewed. No pertinent past surgical history.  There were no vitals filed for this visit.  Visit Diagnosis:  Carcinoma of upper-outer quadrant of right female breast - Plan: PT plan of care cert/re-cert  Abnormal posture - Plan: PT plan of care cert/re-cert      Subjective Assessment - 10/24/14 1613    Subjective Patient was seen today for a baseline assessment of her newly diagnosed right breast cancer.   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   Patient Stated Goals Reduce lymphedema risk and learn post op shoulder ROM HEP   Currently in Pain? No/denies            [facility] PT Assessment - 10/24/14 0001    Assessment   Medical Diagnosis Right Triple positive breast cancer   Onset Date 10/17/14   Precautions   Precautions Other (comment)  Active breast cancer   Restrictions   Weight Bearing Restrictions No   Balance Screen   Has the patient fallen in the past 6 months No   Has the patient had a decrease in activity  level because of a fear of falling?  No   Is the patient reluctant to leave their home because of a fear of falling?  No   Home Environment   Living Enviornment Private residence   Living Arrangements Children  Lives with her son and daughter-in-law   Available Help at Discharge Family   Prior Function   Level of [place] with basic ADLs   Vocation Retired   Leisure She goes to a gym twice a week and walks 30 minutes each day   Cognition   Overall Cognitive Status Within Functional Limits for tasks assessed   Posture/Postural Control   Posture/Postural Control Postural limitations   Postural Limitations Forward head;Rounded Shoulders   ROM / Strength   AROM / PROM / Strength AROM;Strength   AROM   AROM Assessment Site Shoulder   Right/Left Shoulder Right;Left   Right Shoulder Extension 52 Degrees   Right Shoulder Flexion 139 Degrees   Right Shoulder ABduction 145 Degrees   Right Shoulder Internal Rotation 65 Degrees   Right Shoulder External Rotation 90 Degrees   Left Shoulder Extension 66 Degrees   Left Shoulder Flexion 139 Degrees   Left Shoulder ABduction 142 Degrees   Left Shoulder Internal Rotation 65 Degrees   Left Shoulder External Rotation 86 Degrees   Strength   Overall Strength Within functional limits for  tasks performed           LYMPHEDEMA/ONCOLOGY QUESTIONNAIRE - 10/24/14 1621    Type   Cancer Type Right breast   Lymphedema Assessments   Lymphedema Assessments Upper extremities   Right Upper Extremity Lymphedema   10 cm Proximal to Olecranon Process 27 cm   Olecranon Process 23 cm   10 cm Proximal to Ulnar Styloid Process 19.8 cm   Just Proximal to Ulnar Styloid Process 14.2 cm   Across Hand at [employer] 17.2 cm   At [place] of 2nd Digit 5.8 cm   Left Upper Extremity Lymphedema   10 cm Proximal to Olecranon Process 25.4 cm   Olecranon Process 23.1 cm   10 cm Proximal to Ulnar Styloid Process 19.4 cm   Just Proximal to Ulnar  Styloid Process 13.5 cm   Across Hand at [employer] 17.5 cm   At [place] of 2nd Digit 5.6 cm       Patient was instructed today in a home exercise program today for post op shoulder range of motion. These included active assist shoulder flexion in sitting, scapular retraction, wall walking with shoulder abduction, and hands behind head external rotation.  She was encouraged to do these twice a day, holding 3 seconds and repeating 5 times when permitted by her physician.         PT Education - 10/24/14 1622    Education provided Yes   Education Details Post op shoulder ROM HEP and lymphedema risk reduction   Person(s) Educated Patient;Child(ren)   Methods Explanation;Demonstration;Handout   Comprehension Verbalized understanding;Returned demonstration              Breast Clinic Goals - 10/24/14 1629    Patient will be able to verbalize understanding of pertinent lymphedema risk reduction practices relevant to her diagnosis specifically related to skin care.   Time 1   Period Days   Status Achieved   Patient will be able to return demonstrate and/or verbalize understanding of the post-op home exercise program related to regaining shoulder range of motion.   Time 1   Period Days   Status Achieved   Patient will be able to verbalize understanding of the importance of attending the postoperative After Breast Cancer Class for further lymphedema risk reduction education and therapeutic exercise.   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  She is planning to have neoadjuvant chemotherapy followed by a right lumpectomy or mastectomy with a sentinel node biopsy.  She will undergo possible radiation and anti-estrogen therapy.  She will  likely benefit from post op PT to regain shoulder ROM and strength and reduce lymphedema risk.   Pt will benefit from skilled therapeutic intervention in order to improve on the following deficits Decreased range of motion;Increased edema;Decreased knowledge of precautions;Impaired UE functional use;Pain;Decreased strength   Rehab Potential Excellent   Clinical Impairments Affecting Rehab Potential none   PT Frequency One time visit   PT Treatment/Interventions Patient/family education;Therapeutic exercise   Consulted and Agree with Plan of Care Patient;Family member/caregiver   Family Member Consulted Son and sister     Patient will follow up at outpatient cancer rehab if  needed following surgery.  If the patient requires physical therapy at that time, a specific plan will be dictated and sent to the referring physician for approval. The patient was educated today on appropriate basic range of motion exercises to begin post operatively and the importance of attending the After Breast Cancer class following surgery.  Patient was educated today on lymphedema risk reduction practices as it pertains to recommendations that will benefit the patient immediately following surgery.  She verbalized good understanding.  No additional physical therapy is indicated at this time.        G-Codes - Nov 01, 2014 1631    Functional Assessment Tool Used Clinical Judgement       Problem List Patient Active Problem List   Diagnosis Date Noted  . Breast cancer of upper-outer quadrant of right female breast 10/19/2014  . SINUSITIS, ACUTE 01/20/2010  . ALLERGIC RHINITIS 01/20/2010  . Nonspecific (abnormal) findings on radiological and other examination of body structure 01/20/2010  . CT, CHEST, ABNORMAL 01/20/2010    Annia Friendly, PT 11/01/14, 4:31 PM  [place], [place], [address] Phone: [phone]   Fax:   [phone]

## 2014-10-26 ENCOUNTER — Telehealth: Payer: Self-pay | Admitting: *Deleted

## 2014-10-26 ENCOUNTER — Encounter: Payer: Self-pay | Admitting: General Practice

## 2014-10-26 NOTE — Progress Notes (Unsigned)
[place] Psychosocial Distress Screening Spiritual Care  Met with Diana Dunn, [name], and [name] in [facility] to introduce [place] team/resources, reviewing distress screen per protocol.  The patient scored a 8 on the Psychosocial Distress Thermometer which indicates severe distress. Also assessed for distress and other psychosocial needs.   ONCBCN DISTRESS SCREENING 10/26/2014  Screening Type Initial Screening  Distress experienced in past week (1-10) 8  Emotional problem type Nervousness/Anxiety;Adjusting to illness  Information Concerns Type Lack of info about diagnosis;Lack of info about treatment;Lack of info about complementary therapy choices  Physical Problem type Pain;Nausea/vomiting;Sleep/insomnia;Loss of appetitie;Constipation/diarrhea  Referral to support programs Yes  Other Diana Dunn describes herself as "scared," especially on arrival to [facility].  At the close of the afternoon, she noted that she was "drained and tired, but good," finding relief from some acute anxiety with increased info and tx plan.  She is also grieving the death of her husband; a strength is that she moved in with her son and DIL after his death, which has been a source of meaning and support.  Additional stressors include pain and antibiotic side effects associated with her infection.    Follow up needed: No.  Pt has packet of [place] resources and contact info, accepted [facility] referral, and is aware of ongoing chaplain availability for support, but please also page as needs arise.  Thank you.  [place], [place]

## 2014-10-26 NOTE — Telephone Encounter (Signed)
Spoke with patient about her appointment on 5/20. She is getting her port put in that day.  Rescheduled her appointment to 5/19 at 115 with Heather.  Patient aware.

## 2014-10-29 ENCOUNTER — Telehealth: Payer: Self-pay | Admitting: *Deleted

## 2014-10-29 ENCOUNTER — Telehealth: Payer: Self-pay | Admitting: Oncology

## 2014-10-29 ENCOUNTER — Encounter: Payer: Self-pay | Admitting: *Deleted

## 2014-10-29 NOTE — Telephone Encounter (Signed)
Spoke to pt concerning [place] from 10/24/14. Denies questions or concerns regarding dx or treatment care plan. Confirmed all future appts. Informed pt that she will be receiving a call for scheduling of echo.  Encourage pt to call with needs. Received verbal understanding. Contact information given.

## 2014-10-29 NOTE — Telephone Encounter (Signed)
Spoke with patient and she is aware of her echo  [employer]

## 2014-10-31 ENCOUNTER — Telehealth: Payer: Self-pay | Admitting: Oncology

## 2014-10-31 ENCOUNTER — Other Ambulatory Visit: Payer: Self-pay | Admitting: Oncology

## 2014-10-31 ENCOUNTER — Other Ambulatory Visit: Payer: Medicare Other

## 2014-10-31 ENCOUNTER — Encounter: Payer: Self-pay | Admitting: General Practice

## 2014-10-31 ENCOUNTER — Ambulatory Visit (HOSPITAL_COMMUNITY)
Admission: RE | Admit: 2014-10-31 | Discharge: 2014-10-31 | Disposition: A | Discharge status: Home / Self Care | Payer: Medicare Other | Source: Ambulatory Visit | Attending: Adult Health | Admitting: Adult Health

## 2014-10-31 DIAGNOSIS — C50919 Malignant neoplasm of unspecified site of unspecified female breast: Secondary | ICD-10-CM | POA: Insufficient documentation

## 2014-10-31 DIAGNOSIS — C50411 Malignant neoplasm of upper-outer quadrant of right female breast: Secondary | ICD-10-CM | POA: Diagnosis not present

## 2014-10-31 NOTE — Progress Notes (Signed)
Spiritual Care Note  Followed up with Diana Dunn in chemo class this morning.  She shared about her processing reality/surreality of dx/tx, grief over loss of husband after his three-month experience with an unusual melanoma, and preparing herself for tx.  She brought many questions, several of which she'd written in her notebook in advance.  Provided pastoral, reassuring presence; reflective listening; normalization of anxiety and grief feelings; emotional support; affirmation of her preparation and self-care.  Highlighted that Spiritual Care's purpose is to assist with processing and to make space for reflection/sharing.  Pt verbalized appreciation and used opportunity to meet her needs.  Following for support, but please also page as needs arise.  Thank you.  [place], [place]

## 2014-10-31 NOTE — Progress Notes (Signed)
  Echocardiogram 2D Echocardiogram has been performed.  Diana Dunn FRANCES 10/31/2014, 9:35 AM

## 2014-11-01 ENCOUNTER — Encounter (HOSPITAL_COMMUNITY): Payer: Self-pay

## 2014-11-01 ENCOUNTER — Telehealth: Payer: Self-pay | Admitting: *Deleted

## 2014-11-01 ENCOUNTER — Encounter: Payer: Self-pay | Admitting: Physical Therapy

## 2014-11-01 ENCOUNTER — Encounter (HOSPITAL_COMMUNITY)
Admission: RE | Admit: 2014-11-01 | Discharge: 2014-11-01 | Disposition: A | Discharge status: Home / Self Care | Payer: Medicare Other | Source: Ambulatory Visit | Attending: General Surgery | Admitting: General Surgery

## 2014-11-01 ENCOUNTER — Ambulatory Visit: Payer: Medicare Other | Admitting: Nurse Practitioner

## 2014-11-01 DIAGNOSIS — Z87891 Personal history of nicotine dependence: Secondary | ICD-10-CM | POA: Diagnosis not present

## 2014-11-01 DIAGNOSIS — Z170 Estrogen receptor positive status [ER+]: Secondary | ICD-10-CM | POA: Diagnosis not present

## 2014-11-01 DIAGNOSIS — C50911 Malignant neoplasm of unspecified site of right female breast: Secondary | ICD-10-CM | POA: Diagnosis not present

## 2014-11-01 HISTORY — DX: Lactose intolerance, unspecified: E73.9

## 2014-11-01 HISTORY — DX: Irritable bowel syndrome without diarrhea: K58.9

## 2014-11-01 HISTORY — DX: Personal history of other diseases of the digestive system: Z87.19

## 2014-11-01 HISTORY — DX: Malignant (primary) neoplasm, unspecified: C80.1

## 2014-11-01 LAB — BASIC METABOLIC PANEL
ANION GAP: 10 (ref 5–15)
BUN: 10 mg/dL (ref 6–20)
CO2: 26 mmol/L (ref 22–32)
Calcium: 9.9 mg/dL (ref 8.9–10.3)
Chloride: 104 mmol/L (ref 101–111)
Creatinine, Ser: 0.85 mg/dL (ref 0.44–1.00)
GFR calc Af Amer: >60 mL/min (ref >60)
GFR calc non Af Amer: >60 mL/min (ref >60)
GLUCOSE: 93 mg/dL (ref 65–99)
Potassium: 4.2 mmol/L (ref 3.5–5.1)
SODIUM: 140 mmol/L (ref 135–145)

## 2014-11-01 LAB — CBC
HCT: 47.3 % — ABNORMAL HIGH (ref 36.0–46.0)
Hemoglobin: 15.9 g/dL — ABNORMAL HIGH (ref 12.0–15.0)
MCH: 32.2 pg (ref 26.0–34.0)
MCHC: 33.6 g/dL (ref 30.0–36.0)
MCV: 95.7 fL (ref 78.0–100.0)
Platelets: 247 10*3/uL (ref 150–400)
RBC: 4.94 MIL/uL (ref 3.87–5.11)
RDW: 12.5 % (ref 11.5–15.5)
WBC: 6.4 10*3/uL (ref 4.0–10.5)

## 2014-11-01 NOTE — Progress Notes (Signed)
I called Assist to merge the MRN's.  ([id number] and [id number]).  DA

## 2014-11-01 NOTE — Pre-Procedure Instructions (Signed)
Diana Dunn  11/01/2014   Your procedure is scheduled on:  Friday, May 20th   Report to [facility] Admitting at  5:30 AM.   Call this number if you have problems the morning of surgery: [phone]   Remember:   Do not eat food or drink liquids after midnight tonight.   Take these medicines the morning of surgery with A SIP OF WATER: Nothing   Do not wear jewelry, make-up or nail polish.  Do not wear lotions, powders, or perfumes. You may NOT wear deodorant the day of surgery.  Do not shave underarms & legs 48 hours prior to surgery.    Do not bring valuables to the hospital.  [facility] is not responsible for any belongings or valuables.               Contacts, dentures or bridgework may not be worn into surgery.  Leave suitcase in the car. After surgery it may be brought to your room.                 Patients discharged the day of surgery will not be allowed to drive home.   Name and phone number of your driver:    Special Instructions: "Preparing for Surgery" instruction sheet.   Please read over the following fact sheets that you were given: Pain Booklet and Surgical Site Infection Prevention

## 2014-11-01 NOTE — Telephone Encounter (Signed)
Per staff message and POF I have scheduled appts. Advised scheduler of appts. JMW  

## 2014-11-02 ENCOUNTER — Ambulatory Visit (HOSPITAL_COMMUNITY): Payer: Medicare Other

## 2014-11-02 ENCOUNTER — Ambulatory Visit: Payer: Medicare Other | Admitting: Oncology

## 2014-11-02 ENCOUNTER — Ambulatory Visit (HOSPITAL_COMMUNITY): Payer: Medicare Other | Admitting: Certified Registered"

## 2014-11-02 ENCOUNTER — Encounter (HOSPITAL_COMMUNITY): Payer: Self-pay | Admitting: Certified Registered"

## 2014-11-02 ENCOUNTER — Ambulatory Visit (HOSPITAL_COMMUNITY)
Admission: RE | Admit: 2014-11-02 | Discharge: 2014-11-02 | Disposition: A | Discharge status: Home / Self Care | Payer: Medicare Other | Source: Ambulatory Visit | Attending: General Surgery | Admitting: General Surgery

## 2014-11-02 ENCOUNTER — Encounter (HOSPITAL_COMMUNITY)
Admission: RE | Disposition: A | Discharge status: Home / Self Care | Payer: Self-pay | Source: Ambulatory Visit | Attending: General Surgery

## 2014-11-02 ENCOUNTER — Encounter: Payer: Self-pay | Admitting: General Practice

## 2014-11-02 DIAGNOSIS — Z170 Estrogen receptor positive status [ER+]: Secondary | ICD-10-CM | POA: Insufficient documentation

## 2014-11-02 DIAGNOSIS — C50919 Malignant neoplasm of unspecified site of unspecified female breast: Secondary | ICD-10-CM

## 2014-11-02 DIAGNOSIS — Z87891 Personal history of nicotine dependence: Secondary | ICD-10-CM | POA: Diagnosis not present

## 2014-11-02 DIAGNOSIS — Z452 Encounter for adjustment and management of vascular access device: Secondary | ICD-10-CM | POA: Diagnosis not present

## 2014-11-02 DIAGNOSIS — K589 Irritable bowel syndrome without diarrhea: Secondary | ICD-10-CM | POA: Diagnosis not present

## 2014-11-02 DIAGNOSIS — C50911 Malignant neoplasm of unspecified site of right female breast: Secondary | ICD-10-CM | POA: Diagnosis not present

## 2014-11-02 DIAGNOSIS — C50411 Malignant neoplasm of upper-outer quadrant of right female breast: Secondary | ICD-10-CM | POA: Diagnosis not present

## 2014-11-02 DIAGNOSIS — Z95828 Presence of other vascular implants and grafts: Secondary | ICD-10-CM

## 2014-11-02 HISTORY — PX: PORTACATH PLACEMENT: SHX2246

## 2014-11-02 SURGERY — INSERTION, TUNNELED CENTRAL VENOUS DEVICE, WITH PORT
Anesthesia: General | Site: Chest | Laterality: Left

## 2014-11-02 MED ORDER — BUPIVACAINE-EPINEPHRINE (PF) 0.25% -1:200000 IJ SOLN
INTRAMUSCULAR | Status: AC
  Filled 2014-11-02: qty 30

## 2014-11-02 MED ORDER — ARTIFICIAL TEARS OP OINT
TOPICAL_OINTMENT | OPHTHALMIC | Status: AC
  Filled 2014-11-02: qty 3.5

## 2014-11-02 MED ORDER — DEXAMETHASONE SODIUM PHOSPHATE 10 MG/ML IJ SOLN
INTRAMUSCULAR | Status: DC | PRN
  Administered 2014-11-02: 10 mg via INTRAVENOUS

## 2014-11-02 MED ORDER — HEPARIN SOD (PORK) LOCK FLUSH 100 UNIT/ML IV SOLN
INTRAVENOUS | Status: AC
  Filled 2014-11-02: qty 5

## 2014-11-02 MED ORDER — ROCURONIUM BROMIDE 50 MG/5ML IV SOLN
INTRAVENOUS | Status: AC
  Filled 2014-11-02: qty 1

## 2014-11-02 MED ORDER — 0.9 % SODIUM CHLORIDE (POUR BTL) OPTIME
TOPICAL | Status: DC | PRN
  Administered 2014-11-02: 1000 mL

## 2014-11-02 MED ORDER — MIDAZOLAM HCL 5 MG/5ML IJ SOLN
INTRAMUSCULAR | Status: DC | PRN
  Administered 2014-11-02: 1 mg via INTRAVENOUS

## 2014-11-02 MED ORDER — EPHEDRINE SULFATE 50 MG/ML IJ SOLN
INTRAMUSCULAR | Status: DC | PRN
  Administered 2014-11-02 (×2): 5 mg via INTRAVENOUS

## 2014-11-02 MED ORDER — CHLORHEXIDINE GLUCONATE 4 % EX LIQD
1.0000 "application " | Freq: Once | CUTANEOUS | Status: DC
  Filled 2014-11-02: qty 15

## 2014-11-02 MED ORDER — PROPOFOL 10 MG/ML IV BOLUS
INTRAVENOUS | Status: AC
  Filled 2014-11-02: qty 20

## 2014-11-02 MED ORDER — OXYCODONE-ACETAMINOPHEN 5-325 MG PO TABS: 1.0000 | ORAL_TABLET | ORAL | Status: DC | PRN

## 2014-11-02 MED ORDER — HYDROCODONE-ACETAMINOPHEN 7.5-325 MG PO TABS: 1.0000 | ORAL_TABLET | Freq: Once | ORAL | Status: DC | PRN

## 2014-11-02 MED ORDER — LIDOCAINE HCL (CARDIAC) 20 MG/ML IV SOLN
INTRAVENOUS | Status: AC
  Filled 2014-11-02: qty 5

## 2014-11-02 MED ORDER — FENTANYL CITRATE (PF) 100 MCG/2ML IJ SOLN
25.0000 ug | INTRAMUSCULAR | Status: DC | PRN
Start: 2014-11-02 — End: 2014-11-02

## 2014-11-02 MED ORDER — MIDAZOLAM HCL 2 MG/2ML IJ SOLN
INTRAMUSCULAR | Status: AC
  Filled 2014-11-02: qty 2

## 2014-11-02 MED ORDER — FENTANYL CITRATE (PF) 100 MCG/2ML IJ SOLN
INTRAMUSCULAR | Status: DC | PRN
  Administered 2014-11-02: 100 ug via INTRAVENOUS

## 2014-11-02 MED ORDER — FENTANYL CITRATE (PF) 250 MCG/5ML IJ SOLN
INTRAMUSCULAR | Status: AC
  Filled 2014-11-02: qty 5

## 2014-11-02 MED ORDER — ARTIFICIAL TEARS OP OINT
TOPICAL_OINTMENT | OPHTHALMIC | Status: DC | PRN
  Administered 2014-11-02: 1 via OPHTHALMIC

## 2014-11-02 MED ORDER — ONDANSETRON HCL 4 MG/2ML IJ SOLN: 4.0000 mg | Freq: Once | INTRAMUSCULAR | Status: DC | PRN

## 2014-11-02 MED ORDER — PROPOFOL 10 MG/ML IV BOLUS
INTRAVENOUS | Status: DC | PRN
  Administered 2014-11-02: 140 mg via INTRAVENOUS

## 2014-11-02 MED ORDER — SODIUM CHLORIDE 0.9 % IJ SOLN
INTRAMUSCULAR | Status: AC
  Filled 2014-11-02: qty 10

## 2014-11-02 MED ORDER — HEPARIN SOD (PORK) LOCK FLUSH 100 UNIT/ML IV SOLN
INTRAVENOUS | Status: DC | PRN
  Administered 2014-11-02: 100 [IU] via INTRAVENOUS

## 2014-11-02 MED ORDER — LACTATED RINGERS IV SOLN
INTRAVENOUS | Status: DC | PRN
  Administered 2014-11-02: 07:00:00 via INTRAVENOUS

## 2014-11-02 MED ORDER — HEPARIN SOD (PORK) LOCK FLUSH 100 UNIT/ML IV SOLN
INTRAVENOUS | Status: DC | PRN
  Administered 2014-11-02: 500 [IU] via INTRAVENOUS

## 2014-11-02 MED ORDER — CEFAZOLIN SODIUM-DEXTROSE 2-3 GM-% IV SOLR
2.0000 g | INTRAVENOUS | Status: AC
  Administered 2014-11-02: 2 g via INTRAVENOUS
  Filled 2014-11-02: qty 50

## 2014-11-02 MED ORDER — LIDOCAINE HCL (CARDIAC) 20 MG/ML IV SOLN
INTRAVENOUS | Status: DC | PRN
  Administered 2014-11-02: 80 mg via INTRAVENOUS

## 2014-11-02 MED ORDER — BUPIVACAINE HCL (PF) 0.25 % IJ SOLN
INTRAMUSCULAR | Status: DC | PRN
  Administered 2014-11-02: 8 mL

## 2014-11-02 MED ORDER — SODIUM CHLORIDE 0.9 % IR SOLN
Status: DC | PRN
  Administered 2014-11-02: 08:00:00

## 2014-11-02 MED ORDER — ONDANSETRON HCL 4 MG/2ML IJ SOLN
INTRAMUSCULAR | Status: AC
  Filled 2014-11-02: qty 2

## 2014-11-02 MED ORDER — ONDANSETRON HCL 4 MG/2ML IJ SOLN
INTRAMUSCULAR | Status: DC | PRN
  Administered 2014-11-02: 4 mg via INTRAVENOUS

## 2014-11-02 MED ORDER — EPHEDRINE SULFATE 50 MG/ML IJ SOLN
INTRAMUSCULAR | Status: AC
  Filled 2014-11-02: qty 1

## 2014-11-02 SURGICAL SUPPLY — 48 items
BAG DECANTER FOR FLEXI CONT (MISCELLANEOUS) ×2 IMPLANT
BLADE SURG 11 STRL SS (BLADE) ×2 IMPLANT
BLADE SURG 15 STRL LF DISP TIS (BLADE) ×1 IMPLANT
BLADE SURG 15 STRL SS (BLADE) ×1
CHLORAPREP W/TINT 10.5 ML (MISCELLANEOUS) ×2 IMPLANT
COVER SURGICAL LIGHT HANDLE (MISCELLANEOUS) ×2 IMPLANT
COVER TRANSDUCER ULTRASND GEL (DRAPE) IMPLANT
CRADLE DONUT ADULT HEAD (MISCELLANEOUS) ×2 IMPLANT
DRAPE C-ARM 42X72 X-RAY (DRAPES) ×2 IMPLANT
DRAPE CHEST BREAST 15X10 FENES (DRAPES) ×2 IMPLANT
DRAPE UTILITY XL STRL (DRAPES) ×4 IMPLANT
ELECT CAUTERY BLADE 6.4 (BLADE) ×2 IMPLANT
ELECT REM PT RETURN 9FT ADLT (ELECTROSURGICAL) ×2
ELECTRODE REM PT RTRN 9FT ADLT (ELECTROSURGICAL) ×1 IMPLANT
GAUZE SPONGE 4X4 16PLY XRAY LF (GAUZE/BANDAGES/DRESSINGS) ×2 IMPLANT
GEL ULTRASOUND 20GR AQUASONIC (MISCELLANEOUS) IMPLANT
GLOVE BIO SURGEON STRL SZ7.5 (GLOVE) ×2 IMPLANT
GLOVE BIOGEL PI IND STRL 7.0 (GLOVE) ×1 IMPLANT
GLOVE BIOGEL PI INDICATOR 7.0 (GLOVE) ×1
GOWN STRL REUS W/ TWL LRG LVL3 (GOWN DISPOSABLE) ×2 IMPLANT
GOWN STRL REUS W/TWL LRG LVL3 (GOWN DISPOSABLE) ×2
INTRODUCER COOK 11FR (CATHETERS) IMPLANT
KIT BASIN OR (CUSTOM PROCEDURE TRAY) ×2 IMPLANT
KIT PORT POWER 8FR ISP CVUE (Catheter) ×2 IMPLANT
KIT PORT POWER 9.6FR MRI PREA (Catheter) IMPLANT
KIT ROOM TURNOVER OR (KITS) ×2 IMPLANT
LIQUID BAND (GAUZE/BANDAGES/DRESSINGS) ×2 IMPLANT
NEEDLE 22X1 1/2 (OR ONLY) (NEEDLE) IMPLANT
NEEDLE HYPO 25GX1X1/2 BEV (NEEDLE) ×2 IMPLANT
NS IRRIG 1000ML POUR BTL (IV SOLUTION) ×2 IMPLANT
PACK SURGICAL SETUP 50X90 (CUSTOM PROCEDURE TRAY) ×2 IMPLANT
PAD ARMBOARD 7.5X6 YLW CONV (MISCELLANEOUS) ×2 IMPLANT
PENCIL BUTTON HOLSTER BLD 10FT (ELECTRODE) ×2 IMPLANT
SET INTRODUCER 12FR PACEMAKER (SHEATH) IMPLANT
SET SHEATH INTRODUCER 10FR (MISCELLANEOUS) IMPLANT
SHEATH COOK PEEL AWAY SET 9F (SHEATH) IMPLANT
SUT MNCRL AB 4-0 PS2 18 (SUTURE) ×2 IMPLANT
SUT PROLENE 2 0 SH 30 (SUTURE) ×4 IMPLANT
SUT SILK 2 0 (SUTURE)
SUT SILK 2-0 18XBRD TIE 12 (SUTURE) IMPLANT
SUT VIC AB 3-0 SH 27 (SUTURE) ×1
SUT VIC AB 3-0 SH 27XBRD (SUTURE) ×1 IMPLANT
SYR 20ML ECCENTRIC (SYRINGE) ×4 IMPLANT
SYR 5ML LUER SLIP (SYRINGE) ×2 IMPLANT
SYR CONTROL 10ML LL (SYRINGE) ×2 IMPLANT
SYRINGE 10CC LL (SYRINGE) IMPLANT
TOWEL OR 17X24 6PK STRL BLUE (TOWEL DISPOSABLE) ×2 IMPLANT
TOWEL OR 17X26 10 PK STRL BLUE (TOWEL DISPOSABLE) ×2 IMPLANT

## 2014-11-02 NOTE — Anesthesia Preprocedure Evaluation (Addendum)
Anesthesia Evaluation  Patient identified by MRN, date of birth, ID band Patient awake    Reviewed: Allergy & Precautions, NPO status , Patient's Chart, lab work & pertinent test results  Airway Mallampati: II  TM Distance: >3 FB Neck ROM: Full    Dental  (+) Teeth Intact, Dental Advisory Given   Pulmonary former smoker,    breath sounds clear to auscultation       Cardiovascular  Rhythm:Regular Rate:Normal     Neuro/Psych    GI/Hepatic   Endo/Other    Renal/GU      Musculoskeletal   Abdominal   Peds  Hematology   Anesthesia Other Findings   Reproductive/Obstetrics                             Anesthesia Physical Anesthesia Plan  ASA: II  Anesthesia Plan: General   Post-op Pain Management:    Induction: Intravenous  Airway Management Planned: LMA  Additional Equipment:   Intra-op Plan:   Post-operative Plan:   Informed Consent: I have reviewed the patients History and Physical, chart, labs and discussed the procedure including the risks, benefits and alternatives for the proposed anesthesia with the patient or authorized representative who has indicated his/her understanding and acceptance.   Dental advisory given  Plan Discussed with: CRNA and Anesthesiologist  Anesthesia Plan Comments:         Anesthesia Quick Evaluation  

## 2014-11-02 NOTE — Op Note (Signed)
11/02/2014  8:25 AM  PATIENT:  Diana Dunn  69 y.o. female  PRE-OPERATIVE DIAGNOSIS:  Right Breast Cancer  POST-OPERATIVE DIAGNOSIS:  Right Breast Cancer  PROCEDURE:  Procedure(s): INSERTION PORT-A-CATH (Left)  SURGEON:  Surgeon(s) and Role:    * Jovita Kussmaul, MD - Primary  PHYSICIAN ASSISTANT:   ASSISTANTS: none   ANESTHESIA:   general  EBL:     BLOOD ADMINISTERED:none  DRAINS: none   LOCAL MEDICATIONS USED:  MARCAINE     SPECIMEN:  No Specimen  DISPOSITION OF SPECIMEN:  N/A  COUNTS:  YES  TOURNIQUET:  * No tourniquets in log *  DICTATION: .Dragon Dictation  After informed consent was obtained the patient was brought to the operating room and placed in the supine position on the operating room table. After adequate induction of general anesthesia was placed between the patient's shoulder blades to extend the shoulder slightly. The chest and neck area were then prepped with ChloraPrep, allowed to dry, and draped in usual sterile manner. The patient was placed in Trendelenburg position. The area lateral to the bend of the clavicle on the left chest was infiltrated with quarter percent [place]. A small incision was made with a 15 blade knife. A large bore needle from the Port-A-Cath kit was used to slide beneath the bend of the clavicle heading towards the sternal notch and in doing so we were able to access the left subclavian vein without difficulty. A wire was fed through the needle with the Seldinger technique without difficulty. The wire was confirmed in the central venous system using real-time fluoroscopy. Next a subcutaneous pocket was created inferior to the incision on the left chest by a combination of blunt finger dissection and some sharp dissection with the electrocautery. The tubing was placed on the reservoir and the reservoir was placed in the pocket. The length of the tubing was estimated using real-time fluoroscopy. The tubing was cut to the  appropriate length. Next the sheath and dilator were fed over the wire using Seldinger technique without difficulty. The dilator and wire were removed. The tubing was fed through the sheath as far as it would go and held in place while the sheath was gently cracked and separated. The position of the catheter was then checked and the tubing appeared to have a unusual turn to it. The tubing was separated from the reservoir and the wire was fed through the tubing to straighten out the turn and this seemed to work well. The wire was then removed and the tubing was placed back on the reservoir. Another real-time fluoroscopy image showed the tip of the catheter to be in the superior vena cava. The tubing was then permanently anchored to the reservoir. The reservoir was anchored in the pocket with 2 2-0 Prolene stitches. The port was then aspirated and it aspirated blood easily. It was then flushed initially with a dilute heparin solution and then with a more concentrated heparin solution. The subcutaneous tissue was then closed over the port with interrupted 3-0 Vicryl stitches. The skin was then closed with a running 4-0 Monocryl subcuticular stitch. Dermabond dressings were applied. The patient tolerated this procedure well. At the end of the case all needle sponge and instrument counts were correct. The patient was then awakened and taken to recovery in stable condition.  PLAN OF CARE: Discharge to home after PACU  PATIENT DISPOSITION:  PACU - hemodynamically stable.   Delay start of Pharmacological VTE agent (>24hrs) due to surgical blood loss  or risk of bleeding: not applicable

## 2014-11-02 NOTE — Transfer of Care (Signed)
Immediate Anesthesia Transfer of Care Note  Patient: Diana Dunn  Procedure(s) Performed: Procedure(s): INSERTION PORT-A-CATH (Left)  Patient Location: PACU  Anesthesia Type:General  Level of Consciousness: awake, alert  and oriented  Airway & Oxygen Therapy: Patient Spontanous Breathing and Patient connected to nasal cannula oxygen  Post-op Assessment: Report given to RN, Post -op Vital signs reviewed and stable and Patient moving all extremities X 4  Post vital signs: Reviewed and stable  Last Vitals:  Filed Vitals:   11/02/14 0621  BP: 154/55  Pulse: 54  Temp: 36.5 C  Resp: 20    Complications: No apparent anesthesia complications

## 2014-11-02 NOTE — Anesthesia Procedure Notes (Signed)
Procedure Name: LMA Insertion Date/Time: 11/02/2014 7:33 AM Performed by: Gaylene Brooks Pre-anesthesia Checklist: Emergency Drugs available, Patient identified, Timeout performed, Suction available and Patient being monitored Patient Re-evaluated:Patient Re-evaluated prior to inductionOxygen Delivery Method: Circle system utilized Preoxygenation: Pre-oxygenation with 100% oxygen Intubation Type: IV induction LMA: LMA inserted LMA Size: 4.0 Number of attempts: 1 Tube secured with: Tape Dental Injury: Teeth and Oropharynx as per pre-operative assessment

## 2014-11-02 NOTE — Progress Notes (Signed)
Spiritual Care Note  Referred by Royston Sinner, RN for follow-up phone call, given pt's level of emotional distress and need to process verbally in chemo class.  Berneice Gandy by phone; she verbalized gratitude for call and welcomed f/u call next week.  (This pm not a good time because she has been sleeping after port surgery.)   Plan to phone again next week, but please also page as needs arise.  Thank you.  [place], [place]

## 2014-11-02 NOTE — H&P (Signed)
Diana Dunn 10/24/2014 10:16 AM Location: [facility] Patient #: [id number] DOB: July 06, 1945 Diana Dunn / Language: Diana Dunn / Race: Diana Dunn Female  History of Present Illness Diana Dunn. Diana Starks MD; 10/24/2014 4:31 PM) The patient is a 69 year old female who presents with breast cancer. We are asked to see the patient in consultation by Dr. Luberta Robertson to evaluate her for a right breast cancer. The patient is a 69 year old white female who first presented with an area of redness in the lateral aspect of the right breast about a year and a half ago. She was treated with antibiotics and the area went away. About 1 month ago she felt a mass in the lateral aspect of the right breast and noticed some redness associated with it. She was treated with 2 rounds of different antibiotics with no improvement. She then underwent ultrasound and MRI. The ultrasound measured the mass to be 2.9 cm. The MRI estimated to be 3.1 cm. 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 Oral Surgery  Diagnostic Studies History Anderson Malta [place], [place]; 10/24/2014 10:16 AM) Colonoscopy >10 years ago  Social History Anderson Malta [place], [place]; 10/24/2014 10:16 AM) Alcohol use Moderate alcohol use. Caffeine use Coffee. No drug use Tobacco use Former smoker.  Family History Anderson Malta [place], [place]; 10/24/2014 10:16 AM) Breast Cancer Family Members In General. Cerebrovascular Accident Mother. Heart Disease Father. Hypertension Mother. Migraine Headache Family Members In General. Seizure disorder Brother. Thyroid problems Mother.  Review of Systems  Anderson Malta Witty RMA; 10/24/2014 10:16 AM) General Present- Appetite Loss, Chills and Fatigue. Not Present- Fever, Night Sweats, Weight Gain and Weight Loss. Skin Not Present- Change in Wart/Mole, Dryness, Hives, Jaundice, New Lesions, Non-Healing Wounds, Rash and Ulcer. HEENT Present- Wears glasses/contact lenses. Not Present- Earache, Hearing Loss, Hoarseness, Nose Bleed, Oral Ulcers, Ringing in the Ears, Seasonal Allergies, Sinus Pain, Sore Throat, Visual Disturbances and Yellow Eyes. Respiratory Not Present- Bloody sputum, Chronic Cough, Difficulty Breathing, Snoring and Wheezing. Breast Present- Breast Mass and Breast Pain. Not Present- Nipple Discharge and Skin Changes. Cardiovascular Not Present- Chest Pain, Difficulty Breathing Lying Down, Leg Cramps, Palpitations, Rapid Heart Rate, Shortness of Breath and Swelling of Extremities. Gastrointestinal Not Present- Abdominal Pain, Bloating, Bloody Stool, Change in Bowel Habits, Chronic diarrhea, Constipation, Difficulty Swallowing, Excessive gas, Gets full quickly at meals, Hemorrhoids, Indigestion, Nausea, Rectal Pain and Vomiting. Musculoskeletal Not Present- Back Pain, Joint Pain, Joint Stiffness, Muscle Pain, Muscle Weakness and Swelling of Extremities. Neurological Not Present- Decreased Memory, Fainting, Headaches, Numbness, Seizures, Tingling, Tremor, Trouble walking and Weakness. Psychiatric Present- Anxiety and Fearful. Not Present- Bipolar, Change in Sleep Pattern, Depression and Frequent crying. Endocrine Not Present- Cold Intolerance, Excessive Hunger, Hair Changes, Heat Intolerance, Hot flashes and New Diabetes. Hematology Not Present- Easy Bruising, Excessive bleeding, Gland problems, HIV and Persistent Infections.   Physical Exam [name] S. Diana Starks MD; 10/24/2014 4:33 PM) General Mental Status-Alert. General Appearance-Consistent with stated age. Hydration-Well hydrated. Voice-Normal.  Head and Neck Head-normocephalic,  atraumatic with no lesions or palpable masses. Trachea-midline. Thyroid Gland Characteristics - normal size and consistency.  Eye Eyeball - Bilateral-Extraocular movements intact. Sclera/Conjunctiva - Bilateral-No scleral icterus.  Chest and Lung Exam Chest and lung exam reveals -quiet,  even and easy respiratory effort with no use of accessory muscles and on auscultation, normal breath sounds, no adventitious sounds and normal vocal resonance. Inspection Chest Wall - Normal. Back - normal.  Breast Note: There is a palpable 3 cm mass in the 9:00 position of the right breast. It does have some tethering to the skin but not to the chest wall. There is a minimal overlying redness. She does have a sizable bruise encompassing the lower portion of the right breast. She does not have any signs of peau d'orange. There is no palpable mass of the left breast. There is no palpable axillary, supraclavicular, or cervical lymphadenopathy.   Cardiovascular Cardiovascular examination reveals -normal heart sounds, regular rate and rhythm with no murmurs and normal pedal pulses bilaterally.  Abdomen Inspection Inspection of the abdomen reveals - No Hernias. Skin - Scar - no surgical scars. Palpation/Percussion Palpation and Percussion of the abdomen reveal - Soft, Non Tender, No Rebound tenderness, No Rigidity (guarding) and No hepatosplenomegaly. Auscultation Auscultation of the abdomen reveals - Bowel sounds normal.  Neurologic Neurologic evaluation reveals -alert and oriented x 3 with no impairment of recent or remote memory. Mental Status-Normal.  Musculoskeletal Normal Exam - Left-Upper Extremity Strength Normal and Lower Extremity Strength Normal. Normal Exam - Right-Upper Extremity Strength Normal and Lower Extremity Strength Normal.  Lymphatic Head & Neck  General Head & Neck Lymphatics: Bilateral - Description - Normal. Axillary  General Axillary Region: Bilateral  - Description - Normal. Tenderness - Non Tender. Femoral & Inguinal  Generalized Femoral & Inguinal Lymphatics: Bilateral - Description - Normal. Tenderness - Non Tender.    Assessment & Plan [name] S. Diana Starks MD; 10/24/2014 4:35 PM) PRIMARY CANCER OF UPPER OUTER QUADRANT OF RIGHT FEMALE BREAST (174.4  C50.411) Impression: The patient appears to have a large cancer in the upper outer quadrant of the right breast that is tethering the skin. Given the size of the cancer and the fact that she is HER-2 positive I think she would benefit from neoadjuvant chemotherapy. If the tumor shrinks then she may have options for breast conservation versus mastectomy. She will require a Port-A-Cath for the chemotherapy. I have discussed with her in detail the risks and benefits of the operation to place the Port-A-Cath as well as some of the technical aspects and she understands and wishes to proceed Current Plans  Follow up in 1 month or as needed   Signed by Luella Cook, MD (10/24/2014 4:35 PM)

## 2014-11-02 NOTE — Interval H&P Note (Signed)
History and Physical Interval Note:  11/02/2014 7:00 AM  Diana Dunn  has presented today for surgery, with the diagnosis of Right Breat Cancer  The various methods of treatment have been discussed with the patient and family. After consideration of risks, benefits and other options for treatment, the patient has consented to  Procedure(s): INSERTION PORT-A-CATH (N/A) as a surgical intervention .  The patient's history has been reviewed, patient examined, no change in status, stable for surgery.  I have reviewed the patient's chart and labs.  Questions were answered to the patient's satisfaction.     TOTH III,Rolena Knutson S

## 2014-11-02 NOTE — Anesthesia Postprocedure Evaluation (Signed)
  Anesthesia Post-op Note  Patient: Diana Dunn  Procedure(s) Performed: Procedure(s): INSERTION PORT-A-CATH (Left)  Patient Location: PACU  Anesthesia Type:General  Level of Consciousness: awake, alert  and oriented  Airway and Oxygen Therapy: Patient Spontanous Breathing and Patient connected to nasal cannula oxygen  Post-op Pain: none  Post-op Assessment: Post-op Vital signs reviewed, Patient's Cardiovascular Status Stable, Respiratory Function Stable, Patent Airway and Pain level controlled  Post-op Vital Signs: stable  Last Vitals:  Filed Vitals:   11/02/14 0935  BP: 134/63  Pulse: 67  Temp:   Resp: 18    Complications: No apparent anesthesia complications

## 2014-11-05 ENCOUNTER — Ambulatory Visit: Payer: Medicare Other

## 2014-11-05 ENCOUNTER — Encounter (HOSPITAL_COMMUNITY): Payer: Self-pay | Admitting: General Surgery

## 2014-11-05 ENCOUNTER — Other Ambulatory Visit: Payer: Medicare Other | Admitting: *Deleted

## 2014-11-05 ENCOUNTER — Other Ambulatory Visit: Payer: Self-pay | Admitting: *Deleted

## 2014-11-05 ENCOUNTER — Other Ambulatory Visit: Payer: Medicare Other

## 2014-11-07 ENCOUNTER — Telehealth: Payer: Self-pay | Admitting: *Deleted

## 2014-11-07 ENCOUNTER — Encounter: Payer: Self-pay | Admitting: General Practice

## 2014-11-07 ENCOUNTER — Ambulatory Visit: Payer: Medicare Other

## 2014-11-07 NOTE — Telephone Encounter (Signed)
Returned pt's call concerning complaints of constipation she's had x 5 days following her port placement on 11/02/14. Pt has tried her normal regimen of "hot prune juice" which she does every morning plus oatmeal and applesauce. She has taken Miralax yesterday and this am, had very little results, mostly liquid stool with specks of solid stool. Pt states, "my stomach feels like there is more in there and I got some stomach pain". Advised pt to take 1 small bottle of magnesium citrate followed by a full glass of water. I also told her if she has not had any results by tomorrow and continues to have abdominal pain she may want to go to the ED per [facility]. Pt agreed with the plan. I will call this pt to f/u in the morning. Pt will see Dr. Jana Hakim on 11/09/14. Message to be forwarded to Gentry Fitz, NP.

## 2014-11-07 NOTE — Progress Notes (Signed)
Spiritual Care Note  Followed up by phone.  Diana Dunn was pleased to receive call and supportive encouragement.  Aside from call she made to PA/RN re "going-to-the-bathroom" pain, she reports being in good spirits, having support from friends and f/u calls from [facility].  She is maintaining a positive attitude, practicing gratitude for lunch with a friend in the park, improvement in how her port site feels, and knowing that medical team will take care of her.  She notes that normalization of her varied, sometimes strong, feelings has been helpful and meaningful for her.  She is also aware of ongoing chaplain availability for support "on good days and bad days" and plans to reach out accordingly.  Plan to follow up in infusion room on 11/15/14 to offer pastoral presence, emotional support, and normalization of feelings, but please also page as needs arise.  Thank you.  [place], [place]

## 2014-11-08 ENCOUNTER — Telehealth: Payer: Self-pay | Admitting: *Deleted

## 2014-11-08 NOTE — Telephone Encounter (Signed)
Called pt to f/u from yesterday's call concerning constipation. Pt took the mag citrate and had excellent results. Pt expressed she feels so much better. No further concerns. Message to be forwarded to Gentry Fitz, NP.

## 2014-11-09 ENCOUNTER — Other Ambulatory Visit: Payer: Self-pay | Admitting: *Deleted

## 2014-11-09 ENCOUNTER — Ambulatory Visit (HOSPITAL_BASED_OUTPATIENT_CLINIC_OR_DEPARTMENT_OTHER): Payer: Medicare Other | Admitting: Oncology

## 2014-11-09 ENCOUNTER — Telehealth: Payer: Self-pay | Admitting: Oncology

## 2014-11-09 VITALS — BP 144/81 | HR 65 | Temp 98.0°F | Resp 18 | Ht 66.5 in | Wt 130.6 lb

## 2014-11-09 DIAGNOSIS — C50811 Malignant neoplasm of overlapping sites of right female breast: Secondary | ICD-10-CM | POA: Diagnosis not present

## 2014-11-09 DIAGNOSIS — C50411 Malignant neoplasm of upper-outer quadrant of right female breast: Secondary | ICD-10-CM

## 2014-11-09 DIAGNOSIS — Z170 Estrogen receptor positive status [ER+]: Secondary | ICD-10-CM

## 2014-11-09 MED ORDER — DEXAMETHASONE 4 MG PO TABS: ORAL_TABLET | ORAL | Status: DC

## 2014-11-09 MED ORDER — LORAZEPAM 0.5 MG PO TABS: 0.5000 mg | ORAL_TABLET | Freq: Every day | ORAL | Status: DC

## 2014-11-09 MED ORDER — TOBRAMYCIN-DEXAMETHASONE 0.3-0.1 % OP SUSP
1.0000 [drp] | OPHTHALMIC | Status: DC
Start: 2014-11-09 — End: 2015-04-26

## 2014-11-09 MED ORDER — LIDOCAINE-PRILOCAINE 2.5-2.5 % EX CREA: 1.0000 "application " | TOPICAL_CREAM | CUTANEOUS | Status: DC | PRN

## 2014-11-09 MED ORDER — ONDANSETRON HCL 8 MG PO TABS: ORAL_TABLET | ORAL | Status: DC

## 2014-11-09 MED ORDER — PROCHLORPERAZINE MALEATE 10 MG PO TABS: 10.0000 mg | ORAL_TABLET | Freq: Four times a day (QID) | ORAL | Status: DC | PRN

## 2014-11-09 NOTE — Progress Notes (Signed)
[place]  [phone] [phone]     ID: Diana Dunn DOB: 21-Jan-1946  MR#: [id number]  TKW#:[id number]  Patient Care Team: Harle Battiest, MD as PCP - General (Obstetrics and Gynecology) Autumn Messing III, MD as Consulting Physician (General Surgery) Chauncey Cruel, MD as Consulting Physician (Oncology) Eppie Gibson, MD as Attending Physician (Radiation Oncology) Mauro Kaufmann, RN as Registered Nurse Rockwell Germany, RN as Registered Nurse Holley Bouche, NP as Nurse Practitioner (Nurse Practitioner) Lona Kettle, MD (Family Medicine) PCP: Harle Battiest, MD OTHER MD:  CHIEF COMPLAINT: Triple positive breast cancer  CURRENT TREATMENT: Chemotherapy and anti-HER-2 immunotherapy   BREAST CANCER HISTORY: From the original intake note:  Diana Dunn had not had a mammogram for approximately 4 years. Sometime mid 2015 she had an area of redness and tenderness in the right breast and she brought this to her physician's attention. She was treated with antibiotics and this completely cleared.Marland Kitchen  Approximately mid April, she again developed redness over the right breast. This was very focal, and it did not look "as red" as the previous time. She brought it to Dr. Harrington Challenger is attention, and was treated with antibiotics. Bilateral mammography with tomosynthesis and right breast ultrasonography was also obtained on 09/19/2014. There was a focal opacity with irregular margins in the upper outer right breast with some central calcifications. There was mild architectural distortion associated with this. On exam Dr. [name] noted a firm palpable tender mass lateral to the right areola with overlying erythema. On ultrasonography there was an irregular hypoechoic mass measuring 2.4 cm.  Repeat right breast ultrasonography after the patient completed her antibiotic course was performed 10/04/2014. On exam there was still a palpable lobulated mass lateral to the right nipple  and by ultrasound this measured 2.6 cm. It was felt to be a possible abscess and a second course of antibiotics, now with Septra, was undertaken. After that treatment, repeat ultrasonography 10/16/2014 continued to show the palpable mass and ultrasonography now found the mass to measure 2.9 cm maximally.  Accordingly biopsy of the mass in question was obtained that same day, 10/16/2014, and showed ([id number]) and invasive ductal carcinoma, grade 2 or 3, estrogen receptor 90% positive, progesterone receptor 80% positive, both with strong staining intensity, with an MIB-1 of 20%, and with HER-2 amplification, the signals ratio being 3.60 and the number per cell 4.50.  Bilateral breast MRI 10/23/2014 showed a breast density to be category B. In the right breast there was an irregular enhancing mass at the 9:30 o'clock position measuring 3.1 cm. There were no additional worrisome masses in the right breast, no findings of concern in the left breast, and no abnormal appearing lymph nodes.  The patient's subsequent history is as detailed below  INTERVAL HISTORY: Diana Dunn returns today for follow-up of her HER-2 positive breast cancer accompanied by her [name]. Since her last visit here she had her port placed. She had significant constipation from the pain medication, which she quickly stopped. That has resolved. She doesn't have significant pain clear though she does have a little bit of discomfort which sometimes keeps her from sleeping through the night. She also came to chemotherapy school. She had an echocardiogram which shows an excellent ejection fraction. She is here today to discuss supportive medications  REVIEW OF SYSTEMS: Kendahl was feeling a little bit tired but now has resumed her walking program. That includes taking walker, the new dog, for a good walk at least daily. Her activity level  is otherwise normal. A detailed review of systems was noncontributory except as noted   PAST MEDICAL  HISTORY: Past Medical History  Diagnosis Date  . Breast cancer of upper-outer quadrant of right female breast 10/19/2014  . Breast cancer   . History of hiatal hernia   . IBS (irritable bowel syndrome)   . Lactose intolerance   . Cancer     RIGHT BREAST CANCER  Irritable bowel symptoms  PAST SURGICAL HISTORY: Past Surgical History  Procedure Laterality Date  . Breast surgery      BIOPSY OF BREAST  . Colonoscopy    . Portacath placement Left 11/02/2014    Procedure: INSERTION PORT-A-CATH;  Surgeon: Autumn Messing III, MD;  Location: [facility];  Service: General;  Laterality: Left;    FAMILY HISTORY No family history on file. The patient's father died at the age of 71 from a myocardial infarction. The patient's mother died at the age of 23 following a stroke. The patient had 2 brothers. One had Down's syndrome. She had no sisters. The only breast cancer in the family was the patient's father's only sister was diagnosed with breast cancer in her 3s. There is no history of ovarian cancer in the family to the patient's knowledge   GYNECOLOGIC HISTORY:  No LMP recorded. Patient is postmenopausal.  menarche age 12, first live birth age 69, she is [place] P2. She went through the change of life in late 1999. She did not take hormone replacement. She took birth control pills for less than 2 years in her 55I, with no complications   SOCIAL HISTORY:  Dana is my neighbor. 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 Bill died from widely metastatic malignant melanoma within 4 months of diagnosis some years ago. The patient lives with her [name] and his wife, who is expecting their first child late November 2016. The other son, Diana Dunn, lives in [place]. Both sons are appraised her's. The patient has 2 grandchildren and is just noted one on the way. The patient attends the [employer]: Not in place. At the 10/24/2014 visit the  patient was given the appropriate documents 2 complete and notarize at her discretion   HEALTH MAINTENANCE: History  Substance Use Topics  . Smoking status: Former Smoker    Types: Cigarettes    Quit date: 08/03/2014  . Smokeless tobacco: Not on file  . Alcohol Use: 3.6 oz/week    6 Glasses of wine per week     Colonoscopy: 2000?  PAP:  Bone density: On wrist, remote   Lipid panel:  Allergies  Allergen Reactions  . Aspirin     Stomach pain  . Aspirin Nausea Only    GI upset/pain    Current Outpatient Prescriptions  Medication Sig Dispense Refill  . hyoscyamine (ANASPAZ) 0.125 MG TBDP disintergrating tablet Place 0.125 mg under the tongue every 6 (six) hours as needed for cramping.    . hyoscyamine (LEVSIN, ANASPAZ) 0.125 MG tablet Take 0.125 mg by mouth every 4 (four) hours as needed.    Marland Kitchen oxyCODONE-acetaminophen (ROXICET) 5-325 MG per tablet Take 1-2 tablets by mouth every 4 (four) hours as needed. 50 tablet 0   No current facility-administered medications for this visit.    OBJECTIVE: middle-aged white woman in no acute distress Filed Vitals:   11/09/14 1112  BP: 144/81  Pulse: 65  Temp: 98 F (36.7 C)  Resp: 18  Body mass index is 20.77 kg/(m^2).    ECOG FS:0 - Asymptomatic  Sclerae unicteric, EOMs intact Oropharynx clear, dentition in good repair No cervical or supraclavicular adenopathy Lungs no rales or rhonchi Heart regular rate and rhythm Abd soft, nontender, positive bowel sounds MSK no focal spinal tenderness, no upper extremity lymphedema Neuro: nonfocal, well oriented, appropriate affect Breasts: In the lateral aspect of the right breast at approximately 10:00 there is an easily palpable mass which reaches to the area nipple and minimally distorts it. This measures about 2 cm. There is no erythema or obvious skin involvement. The right axilla is benign. The left breast is unremarkable.  LAB RESULTS:  CMP     Component Value Date/Time   NA  140 11/01/2014 1023   NA 139 10/24/2014 1244   K 4.2 11/01/2014 1023   K 3.8 10/24/2014 1244   CL 104 11/01/2014 1023   CO2 26 11/01/2014 1023   CO2 24 10/24/2014 1244   GLUCOSE 93 11/01/2014 1023   GLUCOSE 116 10/24/2014 1244   BUN 10 11/01/2014 1023   BUN 13.1 10/24/2014 1244   CREATININE 0.85 11/01/2014 1023   CREATININE 0.8 10/24/2014 1244   CALCIUM 9.9 11/01/2014 1023   CALCIUM 9.6 10/24/2014 1244   PROT 6.7 10/24/2014 1244   ALBUMIN 4.2 10/24/2014 1244   AST 16 10/24/2014 1244   ALT 10 10/24/2014 1244   ALKPHOS 65 10/24/2014 1244   BILITOT 0.50 10/24/2014 1244   GFRNONAA >60 11/01/2014 1023   GFRAA >60 11/01/2014 1023    INo results found for: SPEP, UPEP  Lab Results  Component Value Date   WBC 6.4 11/01/2014   NEUTROABS 3.6 10/24/2014   HGB 15.9* 11/01/2014   HCT 47.3* 11/01/2014   MCV 95.7 11/01/2014   PLT 247 11/01/2014      Chemistry      Component Value Date/Time   NA 140 11/01/2014 1023   NA 139 10/24/2014 1244   K 4.2 11/01/2014 1023   K 3.8 10/24/2014 1244   CL 104 11/01/2014 1023   CO2 26 11/01/2014 1023   CO2 24 10/24/2014 1244   BUN 10 11/01/2014 1023   BUN 13.1 10/24/2014 1244   CREATININE 0.85 11/01/2014 1023   CREATININE 0.8 10/24/2014 1244      Component Value Date/Time   CALCIUM 9.9 11/01/2014 1023   CALCIUM 9.6 10/24/2014 1244   ALKPHOS 65 10/24/2014 1244   AST 16 10/24/2014 1244   ALT 10 10/24/2014 1244   BILITOT 0.50 10/24/2014 1244       No results found for: LABCA2  No components found for: LABCA125  No results for input(s): INR in the last 168 hours.  Urinalysis No results found for: COLORURINE, APPEARANCEUR, LABSPEC, PHURINE, GLUCOSEU, HGBUR, BILIRUBINUR, KETONESUR, PROTEINUR, UROBILINOGEN, NITRITE, LEUKOCYTESUR  STUDIES: Mr Breast Bilateral W Wo Contrast  10/23/2014   CLINICAL DATA:  Recently diagnosed right breast invasive ductal carcinoma. Preoperative evaluation.  LABS:  BUN and creatinine were obtained on site  at [facility] at  [address]  Results:  BUN fourteen mg/dL,  Creatinine 0.9 mg/dL.  EXAM: BILATERAL BREAST MRI WITH AND WITHOUT CONTRAST  TECHNIQUE: Multiplanar, multisequence MR images of both breasts were obtained prior to and following the intravenous administration of 12 ml of MultiHance  THREE-DIMENSIONAL MR IMAGE RENDERING ON INDEPENDENT WORKSTATION:  Three-dimensional MR images were rendered by post-processing of the original MR data on an independent workstation. The three-dimensional MR images were interpreted, and findings are reported in  the following complete MRI report for this study. Three dimensional images were evaluated at the independent DynaCad workstation  COMPARISON:  Previous exam(s).  FINDINGS: Breast composition: b.  Scattered fibroglandular tissue.  Background parenchymal enhancement: Moderate.  Right breast: There is an irregular enhancing mass with central clip artifact located within the right breast at the 9:30 o'clock position (anterior 1/3) corresponding to the recently diagnosed right breast invasive ductal carcinoma. This measures 3.1 x 2.2 x 1.6 cm in size and is associated with washout enhancement kinetics. There are no additional worrisome enhancing foci within the right breast.  Left breast: No mass or abnormal enhancement.  Lymph nodes: No abnormal appearing lymph nodes.  Ancillary findings:  None.  IMPRESSION: 3.1 cm irregular enhancing mass located within the right breast at the 9:30 o'clock position corresponding to the recently diagnosed right breast invasive ductal carcinoma. No additional findings.  RECOMMENDATION: Treatment plan.  BI-RADS CATEGORY  6: Known biopsy-proven malignancy.   Electronically Signed   By: Altamese Cabal M.D.   On: 10/23/2014 12:06   Dg Chest Port 1 View  11/02/2014   CLINICAL DATA:  Status post port insertion  EXAM: PORTABLE CHEST - 1 VIEW  COMPARISON:  01/08/2010  FINDINGS: Cardiac shadow is within normal limits. A new  left-sided chest wall port is seen with the catheter tip in the distal superior vena cava. The lungs are well-aerated without evidence of pneumothorax. No focal infiltrate is seen. No bony abnormality is noted.  IMPRESSION: No evidence of post port placement pneumothorax.   Electronically Signed   By: Inez Catalina M.D.   On: 11/02/2014 09:15   Mm Digital Diagnostic Unilat R  10/16/2014   CLINICAL DATA:  Post biopsy clip mammograms.  EXAM: DIAGNOSTIC RIGHT MAMMOGRAM POST ULTRASOUND BIOPSY  COMPARISON:  Previous exam(s).  FINDINGS: Mammographic images were obtained following ultrasound guided biopsy of a right breast mass. The coil shaped biopsy clip lies within the mass in the lateral right breast.  IMPRESSION: Well-positioned coil shaped biopsy clip following right breast ultrasound-guided core needle biopsy.  Final Assessment: Post Procedure Mammograms for Marker Placement   Electronically Signed   By: Lajean Manes M.D.   On: 10/16/2014 13:52   Dg Fluoro Guide Cv Line-no Report  11/02/2014   CLINICAL DATA:    FLOURO GUIDE CV LINE  Fluoroscopy was utilized by the requesting physician.  No radiographic  interpretation.    US Breast Ltd Uni Right Inc Axilla  10/16/2014   CLINICAL DATA:  Patient returns for repeat right breast ultrasound. Patient had a suspected phlegmon/ abscess in the upper outer right breast. This does not improve with the initial antibiotic treatment. Patient underwent a second antibiotic course with a different antibiotic and now returns for reassessment.  EXAM: ULTRASOUND OF THE RIGHT BREAST  COMPARISON:  Previous exam(s).  FINDINGS: On physical exam, palpable mass lateral to the right nipple is again noted.  Targeted ultrasound is performed, showing a hypoechoic lobulated mass in the 9 o'clock position of the right breast, 2 cm the nipple, now measuring 2.9 cm x 1.5 cm x 2.1 cm, previously 2.6 cm x 1.8 cm x 2.4 cm.  IMPRESSION: Hypoechoic lobulated mass in the right breast. This has not  improved with antibiotic therapy. Although it may still reflect an area of inflammation/infection, neoplastic disease must be excluded. Biopsy is recommended.  RECOMMENDATION: Ultrasound-guided core needle biopsy will be performed today.  I have discussed the findings and recommendations with the patient. Results were also provided in  writing at the conclusion of the visit. If applicable, a reminder letter will be sent to the patient regarding the next appointment.  BI-RADS CATEGORY  4: Suspicious abnormality - biopsy should be considered.   Electronically Signed   By: Lajean Manes M.D.   On: 10/16/2014 10:55   [place] Rt Breast Bx W Loc Dev 1st Lesion Img Bx Spec US Guide  10/18/2014   ADDENDUM REPORT: 10/18/2014 07:37  ADDENDUM: Pathology revealed grade II to III invasive ductal carcinoma in the right breast. This was found to be concordant by Dr. Lovey Newcomer. Pathology was discussed with the patient and her son, Milta Deiters, by telephone. She reported doing well after the biopsy with tenderness and bruising at the site. Post biopsy instructions and care were reviewed and her questions were answered. She has been scheduled at [facility] on Oct 24, 2014. A bilateral breast MRI has been scheduled on Oct 23, 2014. She is encouraged to come to [facility] [employer] for educational materials. My number was provided for additional questions and concerns.  Pathology results reported by Susa Raring RN, BSN on Oct 18, 2014.   Electronically Signed   By: Lajean Manes M.D.   On: 10/18/2014 07:37   10/18/2014   CLINICAL DATA:  Patient returned for ultrasound-guided core needle biopsy of a lobulated hypoechoic mass in the lateral right breast.  EXAM: ULTRASOUND GUIDED RIGHT BREAST CORE NEEDLE BIOPSY  COMPARISON:  Previous exam(s).  PROCEDURE: I met with the patient and we discussed the procedure of ultrasound-guided biopsy, including benefits and alternatives. We discussed the  high likelihood of a successful procedure. We discussed the risks of the procedure including infection, bleeding, tissue injury, clip migration, and inadequate sampling. Informed written consent was given. The usual time-out protocol was performed immediately prior to the procedure.  Using sterile technique and 2% Lidocaine as local anesthetic, under direct ultrasound visualization, a 12 gauge vacuum-assisted device was used to perform biopsy of the lateral right breast massusing a inferior, lateral approach. At the conclusion of the procedure, a coil shaped tissue marker clip was deployed into the biopsy cavity. Follow-up 2-view mammogram was performed and dictated separately.  IMPRESSION: Ultrasound-guided biopsy of a right breast mass. No apparent complications.  Electronically Signed: By: Lajean Manes M.D. On: 10/16/2014 13:51    ASSESSMENT: 69 y.o. [place] woman status post right breast biopsy 10/16/2014 for a clinical T3 N0, stage IIA invasive ductal carcinoma, grade 2 or 3, estrogen and progesterone receptor positive, with an MIB-1 of 20%, and HER-2 amplified, with a signals ratio of 3.60  (1) chemotherapy with anti-HER-2 immunotherapy to start 11/05/2014. This will consist of carboplatin, docetaxel, trastuzumab and pertuzumab given every 21 days 6, with Neulasta support  (2) trastuzumab will be continued to complete 1 year  (a) echocardiogram 10/31/2014 shows an ejection fraction of 55-60%.  (3) definitive surgery to follow chemotherapy  (4) adjuvant radiation to follow surgery as appropriate  (5) anti-estrogens to follow completion of local treatments.  PLAN: Victory has her port in place and has a good baseline ejection fraction. Today we went over her chemotherapy again, and she has a good understanding of the possible toxicities, side effects and complications especially after review from our "chemotherapy school".  She will have her first dose on June 2. Today I gave her a  "roadmap" or matrix on how to take all her supportive medicines and we went over that in detail. I went ahead and placed  all the prescriptions so they are available at her pharmacy for her to become today. I've also made her a return appointment with me for a week after chemotherapy.   I have asked her to keep a diary of side effects so we can troubleshoot any problems she had. Of course if there are any issues that need immediate attention she does not have to wait until that visit but she will call and we will work her in as needed.  After 3 cycles of chemotherapy I will reassess the right breast. The mass is now very easily palpable. If there is an obvious response we may not need to repeat an MRI until the end of treatment but otherwise we would do at the midcourse.  Kaityln has a good understanding of the overall plan. She agrees with it. She knows the goal of treatment in her case is cure. She will call with any problems that may develop before her next visit here.  Chauncey Cruel, MD   11/09/2014 11:18 AM Medical Oncology and Hematology [facility] [address] [place], [place] [address] Tel. [phone]    Fax. [phone]

## 2014-11-09 NOTE — Telephone Encounter (Signed)
Gave avs & calendar for [facility], August

## 2014-11-14 ENCOUNTER — Other Ambulatory Visit: Payer: Self-pay

## 2014-11-14 DIAGNOSIS — C50411 Malignant neoplasm of upper-outer quadrant of right female breast: Secondary | ICD-10-CM

## 2014-11-15 ENCOUNTER — Other Ambulatory Visit (HOSPITAL_BASED_OUTPATIENT_CLINIC_OR_DEPARTMENT_OTHER): Payer: Medicare Other

## 2014-11-15 ENCOUNTER — Ambulatory Visit (HOSPITAL_BASED_OUTPATIENT_CLINIC_OR_DEPARTMENT_OTHER): Payer: Medicare Other

## 2014-11-15 ENCOUNTER — Encounter: Payer: Self-pay | Admitting: *Deleted

## 2014-11-15 VITALS — BP 110/99 | HR 51 | Temp 98.3°F | Resp 16

## 2014-11-15 DIAGNOSIS — Z5112 Encounter for antineoplastic immunotherapy: Secondary | ICD-10-CM

## 2014-11-15 DIAGNOSIS — Z5111 Encounter for antineoplastic chemotherapy: Secondary | ICD-10-CM

## 2014-11-15 DIAGNOSIS — C50411 Malignant neoplasm of upper-outer quadrant of right female breast: Secondary | ICD-10-CM

## 2014-11-15 DIAGNOSIS — C50811 Malignant neoplasm of overlapping sites of right female breast: Secondary | ICD-10-CM

## 2014-11-15 LAB — COMPREHENSIVE METABOLIC PANEL (CC13)
ALK PHOS: 70 U/L (ref 40–150)
ALT: 11 U/L (ref 0–55)
AST: 14 U/L (ref 5–34)
Albumin: 4.2 g/dL (ref 3.5–5.0)
Anion Gap: 11 mEq/L (ref 3–11)
BILIRUBIN TOTAL: 0.53 mg/dL (ref 0.20–1.20)
BUN: 13.1 mg/dL (ref 7.0–26.0)
CO2: 24 meq/L (ref 22–29)
Calcium: 10.4 mg/dL (ref 8.4–10.4)
Chloride: 105 mEq/L (ref 98–109)
Creatinine: 0.9 mg/dL (ref 0.6–1.1)
EGFR: 71 mL/min/{1.73_m2} — ABNORMAL LOW (ref >90)
GLUCOSE: 134 mg/dL (ref 70–140)
Potassium: 4.2 mEq/L (ref 3.5–5.1)
Sodium: 140 mEq/L (ref 136–145)
Total Protein: 7 g/dL (ref 6.4–8.3)

## 2014-11-15 LAB — CBC WITH DIFFERENTIAL/PLATELET
BASO%: 0 % (ref 0.0–2.0)
Basophils Absolute: 0 10*3/uL (ref 0.0–0.1)
EOS ABS: 0 10*3/uL (ref 0.0–0.5)
EOS%: 0 % (ref 0.0–7.0)
HCT: 45.5 % (ref 34.8–46.6)
HGB: 16.1 g/dL — ABNORMAL HIGH (ref 11.6–15.9)
LYMPH#: 0.9 10*3/uL (ref 0.9–3.3)
LYMPH%: 10.2 % — AB (ref 14.0–49.7)
MCH: 33.6 pg (ref 25.1–34.0)
MCHC: 35.4 g/dL (ref 31.5–36.0)
MCV: 95 fL (ref 79.5–101.0)
MONO#: 0.4 10*3/uL (ref 0.1–0.9)
MONO%: 5.1 % (ref 0.0–14.0)
NEUT%: 84.7 % — ABNORMAL HIGH (ref 38.4–76.8)
NEUTROS ABS: 7.3 10*3/uL — AB (ref 1.5–6.5)
Platelets: 236 10*3/uL (ref 145–400)
RBC: 4.79 10*6/uL (ref 3.70–5.45)
RDW: 12.2 % (ref 11.2–14.5)
WBC: 8.6 10*3/uL (ref 3.9–10.3)

## 2014-11-15 MED ORDER — HEPARIN SOD (PORK) LOCK FLUSH 100 UNIT/ML IV SOLN
500.0000 [IU] | Freq: Once | INTRAVENOUS | Status: AC | PRN
  Administered 2014-11-15: 500 [IU]
  Filled 2014-11-15: qty 5

## 2014-11-15 MED ORDER — DIPHENHYDRAMINE HCL 25 MG PO CAPS
25.0000 mg | ORAL_CAPSULE | Freq: Once | ORAL | Status: AC
  Administered 2014-11-15: 25 mg via ORAL

## 2014-11-15 MED ORDER — SODIUM CHLORIDE 0.9 % IJ SOLN
10.0000 mL | INTRAMUSCULAR | Status: DC | PRN
  Administered 2014-11-15: 10 mL
  Filled 2014-11-15: qty 10

## 2014-11-15 MED ORDER — SODIUM CHLORIDE 0.9 % IV SOLN
Freq: Once | INTRAVENOUS | Status: AC
  Administered 2014-11-15: 14:00:00 via INTRAVENOUS
  Filled 2014-11-15: qty 8

## 2014-11-15 MED ORDER — SODIUM CHLORIDE 0.9 % IV SOLN
Freq: Once | INTRAVENOUS | Status: AC
  Administered 2014-11-15: 10:00:00 via INTRAVENOUS

## 2014-11-15 MED ORDER — DOCETAXEL CHEMO INJECTION 160 MG/16ML
75.0000 mg/m2 | Freq: Once | INTRAVENOUS | Status: AC
  Administered 2014-11-15: 130 mg via INTRAVENOUS
  Filled 2014-11-15: qty 13

## 2014-11-15 MED ORDER — SODIUM CHLORIDE 0.9 % IV SOLN
840.0000 mg | Freq: Once | INTRAVENOUS | Status: AC
  Administered 2014-11-15: 840 mg via INTRAVENOUS
  Filled 2014-11-15: qty 28

## 2014-11-15 MED ORDER — SODIUM CHLORIDE 0.9 % IV SOLN
8.0000 mg/kg | Freq: Once | INTRAVENOUS | Status: AC
  Administered 2014-11-15: 483 mg via INTRAVENOUS
  Filled 2014-11-15: qty 23

## 2014-11-15 MED ORDER — SODIUM CHLORIDE 0.9 % IV SOLN
389.0000 mg | Freq: Once | INTRAVENOUS | Status: AC
  Administered 2014-11-15: 390 mg via INTRAVENOUS
  Filled 2014-11-15: qty 39

## 2014-11-15 MED ORDER — ACETAMINOPHEN 325 MG PO TABS
ORAL_TABLET | ORAL | Status: AC
  Filled 2014-11-15: qty 2

## 2014-11-15 MED ORDER — DIPHENHYDRAMINE HCL 25 MG PO CAPS
ORAL_CAPSULE | ORAL | Status: AC
  Filled 2014-11-15: qty 2

## 2014-11-15 MED ORDER — ACETAMINOPHEN 325 MG PO TABS
650.0000 mg | ORAL_TABLET | Freq: Once | ORAL | Status: AC
  Administered 2014-11-15: 650 mg via ORAL

## 2014-11-15 NOTE — Patient Instructions (Signed)
Trastuzumab injection for infusion What is this medicine? TRASTUZUMAB (tras TOO zoo mab) is a monoclonal antibody. It targets a protein called HER2. This protein is found in some stomach and breast cancers. This medicine can stop cancer cell growth. This medicine may be used with other cancer treatments. This medicine may be used for other purposes; ask your health care provider or pharmacist if you have questions. COMMON BRAND NAME(S): Herceptin What should I tell my health care provider before I take this medicine? They need to know if you have any of these conditions: -heart disease -heart failure -infection (especially a virus infection such as chickenpox, cold sores, or herpes) -lung or breathing disease, like asthma -recent or ongoing radiation therapy -an unusual or allergic reaction to trastuzumab, benzyl alcohol, or other medications, foods, dyes, or preservatives -pregnant or trying to get pregnant -breast-feeding How should I use this medicine? This drug is given as an infusion into a vein. It is administered in a hospital or clinic by a specially trained health care professional. Talk to your pediatrician regarding the use of this medicine in children. This medicine is not approved for use in children. Overdosage: If you think you have taken too much of this medicine contact a poison control center or emergency room at once. NOTE: This medicine is only for you. Do not share this medicine with others. What if I miss a dose? It is important not to miss a dose. Call your doctor or health care professional if you are unable to keep an appointment. What may interact with this medicine? -cyclophosphamide -doxorubicin -warfarin This list may not describe all possible interactions. Give your health care provider a list of all the medicines, herbs, non-prescription drugs, or dietary supplements you use. Also tell them if you smoke, drink alcohol, or use illegal drugs. Some items may  interact with your medicine. What should I watch for while using this medicine? Visit your doctor for checks on your progress. Report any side effects. Continue your course of treatment even though you feel ill unless your doctor tells you to stop. Call your doctor or health care professional for advice if you get a fever, chills or sore throat, or other symptoms of a cold or flu. Do not treat yourself. Try to avoid being around people who are sick. You may experience fever, chills and shaking during your first infusion. These effects are usually mild and can be treated with other medicines. Report any side effects during the infusion to your health care professional. Fever and chills usually do not happen with later infusions. What side effects may I notice from receiving this medicine? Side effects that you should report to your doctor or other health care professional as soon as possible: -breathing difficulties -chest pain or palpitations -cough -dizziness or fainting -fever or chills, sore throat -skin rash, itching or hives -swelling of the legs or ankles -unusually weak or tired Side effects that usually do not require medical attention (report to your doctor or other health care professional if they continue or are bothersome): -loss of appetite -headache -muscle aches -nausea This list may not describe all possible side effects. Call your doctor for medical advice about side effects. You may report side effects to FDA at 1-800-FDA-1088. Where should I keep my medicine? This drug is given in a hospital or clinic and will not be stored at home. NOTE: This sheet is a summary. It may not cover all possible information. If you have questions about this medicine, talk   to your doctor, pharmacist, or health care provider.  2015, [employer]. (2009-04-05 13:43:15) Pertuzumab injection What is this medicine? PERTUZUMAB (per TOOZ ue mab) is a monoclonal antibody that targets a  protein called HER2. HER2 is found in some breast cancers. This medicine can stop cancer cell growth. This medicine is used with other cancer treatments. This medicine may be used for other purposes; ask your health care provider or pharmacist if you have questions. COMMON BRAND NAME(S): PERJETA What should I tell my health care provider before I take this medicine? They need to know if you have any of these conditions: -heart disease -heart failure -high blood pressure -history of irregular heart beat -recent or ongoing radiation therapy -an unusual or allergic reaction to pertuzumab, other medicines, foods, dyes, or preservatives -pregnant or trying to get pregnant -breast-feeding How should I use this medicine? This medicine is for infusion into a vein. It is given by a health care professional in a hospital or clinic setting. Talk to your pediatrician regarding the use of this medicine in children. Special care may be needed. Overdosage: If you think you've taken too much of this medicine contact a poison control center or emergency room at once. Overdosage: If you think you have taken too much of this medicine contact a poison control center or emergency room at once. NOTE: This medicine is only for you. Do not share this medicine with others. What if I miss a dose? It is important not to miss your dose. Call your doctor or health care professional if you are unable to keep an appointment. What may interact with this medicine? Interactions are not expected. Give your health care provider a list of all the medicines, herbs, non-prescription drugs, or dietary supplements you use. Also tell them if you smoke, drink alcohol, or use illegal drugs. Some items may interact with your medicine. This list may not describe all possible interactions. Give your health care provider a list of all the medicines, herbs, non-prescription drugs, or dietary supplements you use. Also tell them if you smoke,  drink alcohol, or use illegal drugs. Some items may interact with your medicine. What should I watch for while using this medicine? Your condition will be monitored carefully while you are receiving this medicine. Report any side effects. Continue your course of treatment even though you feel ill unless your doctor tells you to stop. Do not become pregnant while taking this medicine. Women should inform their doctor if they wish to become pregnant or think they might be pregnant. There is a potential for serious side effects to an unborn child. Talk to your health care professional or pharmacist for more information. Do not breast-feed an infant while taking this medicine. Call your doctor or health care professional for advice if you get a fever, chills or sore throat, or other symptoms of a cold or flu. Do not treat yourself. Try to avoid being around people who are sick. You may experience fever, chills, and headache during the infusion. Report any side effects during the infusion to your health care professional. What side effects may I notice from receiving this medicine? Side effects that you should report to your doctor or health care professional as soon as possible: -breathing problems -chest pain or palpitations -dizziness -feeling faint or lightheaded -fever or chills -skin rash, itching or hives -sore throat -swelling of the face, lips, or tongue -swelling of the legs or ankles -unusually weak or tired Side effects that usually do not require  medical attention (Report these to your doctor or health care professional if they continue or are bothersome.): -diarrhea -hair loss -nausea, vomiting -tiredness This list may not describe all possible side effects. Call your doctor for medical advice about side effects. You may report side effects to FDA at 1-800-FDA-1088. Where should I keep my medicine? This drug is given in a hospital or clinic and will not be stored at home. NOTE:  This sheet is a summary. It may not cover all possible information. If you have questions about this medicine, talk to your doctor, pharmacist, or health care provider.  2015, [employer] Standard. (2012-03-30 16:54:15) Carboplatin injection What is this medicine? CARBOPLATIN (KAR boe pla tin) is a chemotherapy drug. It targets fast dividing cells, like cancer cells, and causes these cells to die. This medicine is used to treat ovarian cancer and many other cancers. This medicine may be used for other purposes; ask your health care provider or pharmacist if you have questions. COMMON BRAND NAME(S): Paraplatin What should I tell my health care provider before I take this medicine? They need to know if you have any of these conditions: -blood disorders -hearing problems -kidney disease -recent or ongoing radiation therapy -an unusual or allergic reaction to carboplatin, cisplatin, other chemotherapy, other medicines, foods, dyes, or preservatives -pregnant or trying to get pregnant -breast-feeding How should I use this medicine? This drug is usually given as an infusion into a vein. It is administered in a hospital or clinic by a specially trained health care professional. Talk to your pediatrician regarding the use of this medicine in children. Special care may be needed. Overdosage: If you think you have taken too much of this medicine contact a poison control center or emergency room at once. NOTE: This medicine is only for you. Do not share this medicine with others. What if I miss a dose? It is important not to miss a dose. Call your doctor or health care professional if you are unable to keep an appointment. What may interact with this medicine? -medicines for seizures -medicines to increase blood counts like filgrastim, pegfilgrastim, sargramostim -some antibiotics like amikacin, gentamicin, neomycin, streptomycin, tobramycin -vaccines Talk to your doctor or health care  professional before taking any of these medicines: -acetaminophen -aspirin -ibuprofen -ketoprofen -naproxen This list may not describe all possible interactions. Give your health care provider a list of all the medicines, herbs, non-prescription drugs, or dietary supplements you use. Also tell them if you smoke, drink alcohol, or use illegal drugs. Some items may interact with your medicine. What should I watch for while using this medicine? Your condition will be monitored carefully while you are receiving this medicine. You will need important blood work done while you are taking this medicine. This drug may make you feel generally unwell. This is not uncommon, as chemotherapy can affect healthy cells as well as cancer cells. Report any side effects. Continue your course of treatment even though you feel ill unless your doctor tells you to stop. In some cases, you may be given additional medicines to help with side effects. Follow all directions for their use. Call your doctor or health care professional for advice if you get a fever, chills or sore throat, or other symptoms of a cold or flu. Do not treat yourself. This drug decreases your body's ability to fight infections. Try to avoid being around people who are sick. This medicine may increase your risk to bruise or bleed. Call your doctor or health care professional  if you notice any unusual bleeding. Be careful brushing and flossing your teeth or using a toothpick because you may get an infection or bleed more easily. If you have any dental work done, tell your dentist you are receiving this medicine. Avoid taking products that contain aspirin, acetaminophen, ibuprofen, naproxen, or ketoprofen unless instructed by your doctor. These medicines may hide a fever. Do not become pregnant while taking this medicine. Women should inform their doctor if they wish to become pregnant or think they might be pregnant. There is a potential for serious side  effects to an unborn child. Talk to your health care professional or pharmacist for more information. Do not breast-feed an infant while taking this medicine. What side effects may I notice from receiving this medicine? Side effects that you should report to your doctor or health care professional as soon as possible: -allergic reactions like skin rash, itching or hives, swelling of the face, lips, or tongue -signs of infection - fever or chills, cough, sore throat, pain or difficulty passing urine -signs of decreased platelets or bleeding - bruising, pinpoint red spots on the skin, black, tarry stools, nosebleeds -signs of decreased red blood cells - unusually weak or tired, fainting spells, lightheadedness -breathing problems -changes in hearing -changes in vision -chest pain -high blood pressure -low blood counts - This drug may decrease the number of white blood cells, red blood cells and platelets. You may be at increased risk for infections and bleeding. -nausea and vomiting -pain, swelling, redness or irritation at the injection site -pain, tingling, numbness in the hands or feet -problems with balance, talking, walking -trouble passing urine or change in the amount of urine Side effects that usually do not require medical attention (report to your doctor or health care professional if they continue or are bothersome): -hair loss -loss of appetite -metallic taste in the mouth or changes in taste This list may not describe all possible side effects. Call your doctor for medical advice about side effects. You may report side effects to FDA at 1-800-FDA-1088. Where should I keep my medicine? This drug is given in a hospital or clinic and will not be stored at home. NOTE: This sheet is a summary. It may not cover all possible information. If you have questions about this medicine, talk to your doctor, pharmacist, or health care provider.  2015, [employer] Standard. (2007-09-06  14:38:05) Docetaxel injection What is this medicine? DOCETAXEL (doe se TAX el) is a chemotherapy drug. It targets fast dividing cells, like cancer cells, and causes these cells to die. This medicine is used to treat many types of cancers like breast cancer, certain stomach cancers, head and neck cancer, lung cancer, and prostate cancer. This medicine may be used for other purposes; ask your health care provider or pharmacist if you have questions. COMMON BRAND NAME(S): Docefrez, Taxotere What should I tell my health care provider before I take this medicine? They need to know if you have any of these conditions: -infection (especially a virus infection such as chickenpox, cold sores, or herpes) -liver disease -low blood counts, like low white cell, platelet, or red cell counts -an unusual or allergic reaction to docetaxel, polysorbate 80, other chemotherapy agents, other medicines, foods, dyes, or preservatives -pregnant or trying to get pregnant -breast-feeding How should I use this medicine? This drug is given as an infusion into a vein. It is administered in a hospital or clinic by a specially trained health care professional. Talk to your pediatrician regarding the  use of this medicine in children. Special care may be needed. Overdosage: If you think you have taken too much of this medicine contact a poison control center or emergency room at once. NOTE: This medicine is only for you. Do not share this medicine with others. What if I miss a dose? It is important not to miss your dose. Call your doctor or health care professional if you are unable to keep an appointment. What may interact with this medicine? -cyclosporine -erythromycin -ketoconazole -medicines to increase blood counts like filgrastim, pegfilgrastim, sargramostim -vaccines Talk to your doctor or health care professional before taking any of these  medicines: -acetaminophen -aspirin -ibuprofen -ketoprofen -naproxen This list may not describe all possible interactions. Give your health care provider a list of all the medicines, herbs, non-prescription drugs, or dietary supplements you use. Also tell them if you smoke, drink alcohol, or use illegal drugs. Some items may interact with your medicine. What should I watch for while using this medicine? Your condition will be monitored carefully while you are receiving this medicine. You will need important blood work done while you are taking this medicine. This drug may make you feel generally unwell. This is not uncommon, as chemotherapy can affect healthy cells as well as cancer cells. Report any side effects. Continue your course of treatment even though you feel ill unless your doctor tells you to stop. In some cases, you may be given additional medicines to help with side effects. Follow all directions for their use. Call your doctor or health care professional for advice if you get a fever, chills or sore throat, or other symptoms of a cold or flu. Do not treat yourself. This drug decreases your body's ability to fight infections. Try to avoid being around people who are sick. This medicine may increase your risk to bruise or bleed. Call your doctor or health care professional if you notice any unusual bleeding. Be careful brushing and flossing your teeth or using a toothpick because you may get an infection or bleed more easily. If you have any dental work done, tell your dentist you are receiving this medicine. Avoid taking products that contain aspirin, acetaminophen, ibuprofen, naproxen, or ketoprofen unless instructed by your doctor. These medicines may hide a fever. This medicine contains an alcohol in the product. You may get drowsy or dizzy. Do not drive, use machinery, or do anything that needs mental alertness until you know how this medicine affects you. Do not stand or sit up  quickly, especially if you are an older patient. This reduces the risk of dizzy or fainting spells. Avoid alcoholic drinks Do not become pregnant while taking this medicine. Women should inform their doctor if they wish to become pregnant or think they might be pregnant. There is a potential for serious side effects to an unborn child. Talk to your health care professional or pharmacist for more information. Do not breast-feed an infant while taking this medicine. What side effects may I notice from receiving this medicine? Side effects that you should report to your doctor or health care professional as soon as possible: -allergic reactions like skin rash, itching or hives, swelling of the face, lips, or tongue -low blood counts - This drug may decrease the number of white blood cells, red blood cells and platelets. You may be at increased risk for infections and bleeding. -signs of infection - fever or chills, cough, sore throat, pain or difficulty passing urine -signs of decreased platelets or bleeding - bruising,  pinpoint red spots on the skin, black, tarry stools, nosebleeds -signs of decreased red blood cells - unusually weak or tired, fainting spells, lightheadedness -breathing problems -fast or irregular heartbeat -low blood pressure -mouth sores -nausea and vomiting -pain, swelling, redness or irritation at the injection site -pain, tingling, numbness in the hands or feet -swelling of the ankle, feet, hands -weight gain Side effects that usually do not require medical attention (report to your prescriber or health care professional if they continue or are bothersome): -bone pain -complete hair loss including hair on your head, underarms, pubic hair, eyebrows, and eyelashes -diarrhea -excessive tearing -changes in the color of fingernails -loosening of the fingernails -nausea -muscle pain -red flush to skin -sweating -weak or tired This list may not describe all possible side  effects. Call your doctor for medical advice about side effects. You may report side effects to FDA at 1-800-FDA-1088. Where should I keep my medicine? This drug is given in a hospital or clinic and will not be stored at home. NOTE: This sheet is a summary. It may not cover all possible information. If you have questions about this medicine, talk to your doctor, pharmacist, or health care provider.  2015, [employer]. (2013-04-27 22:21:02)

## 2014-11-15 NOTE — Progress Notes (Signed)
Met with patient today with her 1st chemo treatment.  She is doing well.  She is a little anxious about how she will feel after her chemo.  Discussed with her about the anti-emetics.  Encouraged her to call with any needs or concerns.

## 2014-11-16 ENCOUNTER — Telehealth: Payer: Self-pay

## 2014-11-16 NOTE — Telephone Encounter (Signed)
Spoke with patient.  Sleeping and eating fine  No nausea, vomiting, diarrhea, vomiting,  Denies SOB or temps.  Confirmed appt for injection tomorrow.  Encouraged patient to call with any questions or concerns.  Pt voiced understanding.

## 2014-11-17 ENCOUNTER — Ambulatory Visit (HOSPITAL_BASED_OUTPATIENT_CLINIC_OR_DEPARTMENT_OTHER): Payer: Medicare Other

## 2014-11-17 ENCOUNTER — Ambulatory Visit: Payer: Self-pay

## 2014-11-17 VITALS — BP 115/77 | HR 68 | Temp 98.1°F | Resp 16

## 2014-11-17 DIAGNOSIS — C50411 Malignant neoplasm of upper-outer quadrant of right female breast: Secondary | ICD-10-CM

## 2014-11-17 DIAGNOSIS — C50811 Malignant neoplasm of overlapping sites of right female breast: Secondary | ICD-10-CM

## 2014-11-17 DIAGNOSIS — Z5189 Encounter for other specified aftercare: Secondary | ICD-10-CM

## 2014-11-17 MED ORDER — PEGFILGRASTIM INJECTION 6 MG/0.6ML ~~LOC~~
6.0000 mg | PREFILLED_SYRINGE | Freq: Once | SUBCUTANEOUS | Status: AC
  Administered 2014-11-17: 6 mg via SUBCUTANEOUS

## 2014-11-17 NOTE — Patient Instructions (Signed)
Pegfilgrastim injection What is this medicine? PEGFILGRASTIM (peg fil GRA stim) is a long-acting granulocyte colony-stimulating factor that stimulates the growth of neutrophils, a type of white blood cell important in the body's fight against infection. It is used to reduce the incidence of fever and infection in patients with certain types of cancer who are receiving chemotherapy that affects the bone marrow. This medicine may be used for other purposes; ask your health care provider or pharmacist if you have questions. COMMON BRAND NAME(S): Neulasta What should I tell my health care provider before I take this medicine? They need to know if you have any of these conditions: -latex allergy -ongoing radiation therapy -sickle cell disease -skin reactions to acrylic adhesives (On-Body Injector only) -an unusual or allergic reaction to pegfilgrastim, filgrastim, other medicines, foods, dyes, or preservatives -pregnant or trying to get pregnant -breast-feeding How should I use this medicine? This medicine is for injection under the skin. If you get this medicine at home, you will be taught how to prepare and give the pre-filled syringe or how to use the On-body Injector. Refer to the patient Instructions for Use for detailed instructions. Use exactly as directed. Take your medicine at regular intervals. Do not take your medicine more often than directed. It is important that you put your used needles and syringes in a special sharps container. Do not put them in a trash can. If you do not have a sharps container, call your pharmacist or healthcare provider to get one. Talk to your pediatrician regarding the use of this medicine in children. Special care may be needed. Overdosage: If you think you have taken too much of this medicine contact a poison control center or emergency room at once. NOTE: This medicine is only for you. Do not share this medicine with others. What if I miss a dose? It is  important not to miss your dose. Call your doctor or health care professional if you miss your dose. If you miss a dose due to an On-body Injector failure or leakage, a new dose should be administered as soon as possible using a single prefilled syringe for manual use. What may interact with this medicine? Interactions have not been studied. Give your health care provider a list of all the medicines, herbs, non-prescription drugs, or dietary supplements you use. Also tell them if you smoke, drink alcohol, or use illegal drugs. Some items may interact with your medicine. This list may not describe all possible interactions. Give your health care provider a list of all the medicines, herbs, non-prescription drugs, or dietary supplements you use. Also tell them if you smoke, drink alcohol, or use illegal drugs. Some items may interact with your medicine. What should I watch for while using this medicine? You may need blood work done while you are taking this medicine. If you are going to need a MRI, CT scan, or other procedure, tell your doctor that you are using this medicine (On-Body Injector only). What side effects may I notice from receiving this medicine? Side effects that you should report to your doctor or health care professional as soon as possible: -allergic reactions like skin rash, itching or hives, swelling of the face, lips, or tongue -dizziness -fever -pain, redness, or irritation at site where injected -pinpoint red spots on the skin -shortness of breath or breathing problems -stomach or side pain, or pain at the shoulder -swelling -tiredness -trouble passing urine Side effects that usually do not require medical attention (report to your doctor   or health care professional if they continue or are bothersome): -bone pain -muscle pain This list may not describe all possible side effects. Call your doctor for medical advice about side effects. You may report side effects to FDA at  1-800-FDA-1088. Where should I keep my medicine? Keep out of the reach of children. Store pre-filled syringes in a refrigerator between 2 and 8 degrees C (36 and 46 degrees F). Do not freeze. Keep in carton to protect from light. Throw away this medicine if it is left out of the refrigerator for more than 48 hours. Throw away any unused medicine after the expiration date. NOTE: This sheet is a summary. It may not cover all possible information. If you have questions about this medicine, talk to your doctor, pharmacist, or health care provider.  2015, Elsevier/Gold Standard. (2013-08-31 16:14:05)  

## 2014-11-19 ENCOUNTER — Telehealth: Payer: Self-pay | Admitting: *Deleted

## 2014-11-19 NOTE — Telephone Encounter (Signed)
This RN spoke with pt per her call stating onset yesterday of " heartburn and then my throat feels hot then cold " .  Per discussion pt is not having nausea and completed course of decadrom.  She denies any mouth sores or white spots.  Plan per discussed symptoms is pt will initiate use of pepcid bid until she sees [place] this Friday- she will also use Gaviscon for her more immediate relief with her throat.  Diana Dunn understands to call if symptoms are not relieved.

## 2014-11-21 ENCOUNTER — Telehealth: Payer: Self-pay | Admitting: *Deleted

## 2014-11-21 NOTE — Telephone Encounter (Signed)
Returned patient call after voicemail received requesting call from [place].  "Last night in the middle of the night I started aching around my waist and stomach.  It hurts to walk with these sharp pains.  I took gaviscon to coat my stomach last night.  And I use Pepcid.  This morning I had to go to the bathroom and these pains hit my lower back in kidney area and come around bot sides to the front around both sides of my waist and stomach.  I had bone pain and took the claritin initially but don't need that anymore.  It grabs me when I bend and the pain catches my breath."  Denies nausea, abdominal swelling and had two bm's in the middle of the night last night and this morning.  Has eaten applesauce today and ate last night drinking fluids well.  Occassional groaning with this call.  Will notify Providers.

## 2014-11-21 NOTE — Telephone Encounter (Signed)
Called pt to follow up with the call from Triage this morning. No answer but left a detailed message for pt  to call this nurse back. Pt called me back while I was typing. Pt has taken Tylenol 25 minutes ago and is feeling much better. Pt says she is not experiencing any SOB or chest pain like she was earlier this morning and her bone pain is resolving to her back area as well. Advised pt to continue the Claritin for the next couple of days along with Tylenol. I did offer for pt to come to Symptom Management if these symptoms should come back. Pt did not feel she needs to come since her pain has resolved since taking the Tylenol. I did ask pt was her bowels moving and she said they are moving fine and they are soft. I will call pt tomorrow 6/9 to check on her. She will see Dr. Jana Hakim on Friday 6/10. Message to be forwarded to [facility].

## 2014-11-21 NOTE — Telephone Encounter (Signed)
See today's collaborative phone note.   

## 2014-11-22 ENCOUNTER — Telehealth: Payer: Self-pay | Admitting: *Deleted

## 2014-11-22 NOTE — Telephone Encounter (Signed)
Called to f/u to make sure pt is still doing well since yesterday. Pt says she is not hurting at all. She continued to take Tylenol last night before bed and a Claritin this morning and has not needed any Tylenol today at all. Pt did mentioned the taste in her mouth makes her queasy at times. I suggested to her to try  gingerale and crackers, citrus flavored jellos and if that doesn't help she can take a Zofran also to relieve the nausea. Message to be forwarded to Gentry Fitz, NP.

## 2014-11-23 ENCOUNTER — Other Ambulatory Visit (HOSPITAL_BASED_OUTPATIENT_CLINIC_OR_DEPARTMENT_OTHER): Payer: Medicare Other

## 2014-11-23 ENCOUNTER — Ambulatory Visit (HOSPITAL_BASED_OUTPATIENT_CLINIC_OR_DEPARTMENT_OTHER): Payer: Medicare Other | Admitting: Oncology

## 2014-11-23 ENCOUNTER — Telehealth: Payer: Self-pay | Admitting: Oncology

## 2014-11-23 VITALS — BP 146/84 | HR 74 | Temp 98.0°F | Resp 18 | Ht 66.5 in | Wt 128.0 lb

## 2014-11-23 DIAGNOSIS — C50411 Malignant neoplasm of upper-outer quadrant of right female breast: Secondary | ICD-10-CM

## 2014-11-23 DIAGNOSIS — Z170 Estrogen receptor positive status [ER+]: Secondary | ICD-10-CM

## 2014-11-23 DIAGNOSIS — C50811 Malignant neoplasm of overlapping sites of right female breast: Secondary | ICD-10-CM

## 2014-11-23 LAB — CBC WITH DIFFERENTIAL/PLATELET
BASO%: 0.3 % (ref 0.0–2.0)
BASOS ABS: 0.1 10*3/uL (ref 0.0–0.1)
EOS%: 0.2 % (ref 0.0–7.0)
Eosinophils Absolute: 0.1 10*3/uL (ref 0.0–0.5)
HEMATOCRIT: 46 % (ref 34.8–46.6)
HEMOGLOBIN: 15.3 g/dL (ref 11.6–15.9)
LYMPH%: 10 % — ABNORMAL LOW (ref 14.0–49.7)
MCH: 31.6 pg (ref 25.1–34.0)
MCHC: 33.3 g/dL (ref 31.5–36.0)
MCV: 94.8 fL (ref 79.5–101.0)
MONO#: 0.9 10*3/uL (ref 0.1–0.9)
MONO%: 2.5 % (ref 0.0–14.0)
NEUT%: 87 % — ABNORMAL HIGH (ref 38.4–76.8)
NEUTROS ABS: 30.7 10*3/uL — AB (ref 1.5–6.5)
Platelets: 181 10*3/uL (ref 145–400)
RBC: 4.85 10*6/uL (ref 3.70–5.45)
RDW: 12.6 % (ref 11.2–14.5)
WBC: 35.3 10*3/uL — ABNORMAL HIGH (ref 3.9–10.3)
lymph#: 3.5 10*3/uL — ABNORMAL HIGH (ref 0.9–3.3)

## 2014-11-23 LAB — COMPREHENSIVE METABOLIC PANEL (CC13)
ALT: 16 U/L (ref 0–55)
ANION GAP: 10 meq/L (ref 3–11)
AST: 25 U/L (ref 5–34)
Albumin: 3.9 g/dL (ref 3.5–5.0)
Alkaline Phosphatase: 151 U/L — ABNORMAL HIGH (ref 40–150)
BILIRUBIN TOTAL: 0.32 mg/dL (ref 0.20–1.20)
BUN: 10.3 mg/dL (ref 7.0–26.0)
CALCIUM: 9.4 mg/dL (ref 8.4–10.4)
CO2: 24 mEq/L (ref 22–29)
CREATININE: 0.9 mg/dL (ref 0.6–1.1)
Chloride: 103 mEq/L (ref 98–109)
EGFR: 63 mL/min/{1.73_m2} — ABNORMAL LOW (ref >90)
Glucose: 110 mg/dl (ref 70–140)
Potassium: 3.8 mEq/L (ref 3.5–5.1)
SODIUM: 138 meq/L (ref 136–145)
TOTAL PROTEIN: 6.4 g/dL (ref 6.4–8.3)

## 2014-11-23 MED ORDER — FLUCONAZOLE 100 MG PO TABS: ORAL_TABLET | ORAL | Status: DC

## 2014-11-23 NOTE — Progress Notes (Signed)
[place]  [phone] [phone]     ID: MONAE TOPPING DOB: 1946-03-28  MR#: [id number]  HYI#:[id number]  Patient Care Team: Harle Battiest, MD as PCP - General (Obstetrics and Gynecology) Autumn Messing III, MD as Consulting Physician (General Surgery) Chauncey Cruel, MD as Consulting Physician (Oncology) Eppie Gibson, MD as Attending Physician (Radiation Oncology) Mauro Kaufmann, RN as Registered Nurse Rockwell Germany, RN as Registered Nurse Holley Bouche, NP as Nurse Practitioner (Nurse Practitioner) Lona Kettle, MD (Family Medicine) PCP: Harle Battiest, MD OTHER MD:  CHIEF COMPLAINT: Triple positive breast cancer  CURRENT TREATMENT: Chemotherapy and anti-HER-2 immunotherapy   BREAST CANCER HISTORY: From the original intake note:  Sharlette had not had a mammogram for approximately 4 years. Sometime mid 2015 she had an area of redness and tenderness in the right breast and she brought this to her physician's attention. She was treated with antibiotics and this completely cleared.Marland Kitchen  Approximately mid April, she again developed redness over the right breast. This was very focal, and it did not look "as red" as the previous time. She brought it to Dr. Harrington Challenger is attention, and was treated with antibiotics. Bilateral mammography with tomosynthesis and right breast ultrasonography was also obtained on 09/19/2014. There was a focal opacity with irregular margins in the upper outer right breast with some central calcifications. There was mild architectural distortion associated with this. On exam Dr. [name] noted a firm palpable tender mass lateral to the right areola with overlying erythema. On ultrasonography there was an irregular hypoechoic mass measuring 2.4 cm.  Repeat right breast ultrasonography after the patient completed her antibiotic course was performed 10/04/2014. On exam there was still a palpable lobulated mass lateral to the right nipple  and by ultrasound this measured 2.6 cm. It was felt to be a possible abscess and a second course of antibiotics, now with Septra, was undertaken. After that treatment, repeat ultrasonography 10/16/2014 continued to show the palpable mass and ultrasonography now found the mass to measure 2.9 cm maximally.  Accordingly biopsy of the mass in question was obtained that same day, 10/16/2014, and showed ([id number]) and invasive ductal carcinoma, grade 2 or 3, estrogen receptor 90% positive, progesterone receptor 80% positive, both with strong staining intensity, with an MIB-1 of 20%, and with HER-2 amplification, the signals ratio being 3.60 and the number per cell 4.50.  Bilateral breast MRI 10/23/2014 showed a breast density to be category B. In the right breast there was an irregular enhancing mass at the 9:30 o'clock position measuring 3.1 cm. There were no additional worrisome masses in the right breast, no findings of concern in the left breast, and no abnormal appearing lymph nodes.  The patient's subsequent history is as detailed below  INTERVAL HISTORY: Emillee returns today for follow-up of her HER-2 positive breast cancer accompanied by her son [name]. Today is day 9 cycle 1 of 6 planned cycles of carboplatin, docetaxel, trastuzumab and pertuzumab.  REVIEW OF SYSTEMS: Teja did very well with the infusion itself, with no reactions at all. She did find day 2 and then received her Neulasta shot on day 3. On day 4 she still felt well, and was able to go for a walk. She did start having some taste alteration at that time likely due to thrush, which she however did not notice. She did okay on Monday except she felt a little tired and slept part of the day. On Tuesday however she had significant bone pain with  chills and possibly fever--she did not take her temperature. She felt like she had "a bad flu". She had no energy. For some reason she did not call regarding this, possibly because she expected to  have the symptoms did on Wednesday however she started taking Tylenol. This worked remarkably well. Today she is having essentially no pain. As far as the diarrhea that can come from the pertuzumab, she had perhaps as many as 3 bowel movements on the "worst" day, and there were a little looser than usual, but there was really no significant diarrhea. She had no nausea or vomiting. Her port worked well. She did have a runny nose and minimal epistaxis. A detailed review of systems today was otherwise stable   PAST MEDICAL HISTORY: Past Medical History  Diagnosis Date  . Breast cancer of upper-outer quadrant of right female breast 10/19/2014  . Breast cancer   . History of hiatal hernia   . IBS (irritable bowel syndrome)   . Lactose intolerance   . Cancer     RIGHT BREAST CANCER  Irritable bowel symptoms  PAST SURGICAL HISTORY: Past Surgical History  Procedure Laterality Date  . Breast surgery      BIOPSY OF BREAST  . Colonoscopy    . Portacath placement Left 11/02/2014    Procedure: INSERTION PORT-A-CATH;  Surgeon: Autumn Messing III, MD;  Location: [place];  Service: General;  Laterality: Left;    FAMILY HISTORY No family history on file. The patient's father died at the age of 57 from a myocardial infarction. The patient's mother died at the age of 38 following a stroke. The patient had 2 brothers. One had Down's syndrome. She had no sisters. The only breast cancer in the family was the patient's father's only sister was diagnosed with breast cancer in her 76s. There is no history of ovarian cancer in the family to the patient's knowledge   GYNECOLOGIC HISTORY:  No LMP recorded. Patient is postmenopausal.  menarche age 10, first live birth age 69, she is [place] P2. She went through the change of life in late 1999. She did not take hormone replacement. She took birth control pills for less than 2 years in her 69G, with no complications   SOCIAL HISTORY:  Tyanne is my neighbor. 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 Bill died from widely metastatic malignant melanoma within 4 months of diagnosis some years ago. The patient lives with her son [name] and his wife, who is expecting their first child late November 2016. The other son, [name], lives in [place]. Both sons are appraised her's. The patient has 2 grandchildren and as just noted one on the way. The patient attends the [employer]: Not in place. At the 10/24/2014 visit the patient was given the appropriate documents 2 complete and notarize at her discretion   HEALTH MAINTENANCE: History  Substance Use Topics  . Smoking status: Former Smoker    Types: Cigarettes    Quit date: 08/03/2014  . Smokeless tobacco: Not on file  . Alcohol Use: 3.6 oz/week    6 Glasses of wine per week     Colonoscopy: 2000?  PAP:  Bone density: On wrist, remote   Lipid panel:  Allergies  Allergen Reactions  . Aspirin     Stomach pain  . Aspirin Nausea Only    GI upset/pain    Current Outpatient Prescriptions  Medication Sig Dispense  Refill  . dexamethasone (DECADRON) 4 MG tablet Take two tablets (8 mg) twice a day w food the day before chemo, then the day after chemo and the day after that; last dose the morning of the 4th day counting from chemo as day 1 40 tablet 1  . hyoscyamine (ANASPAZ) 0.125 MG TBDP disintergrating tablet Place 0.125 mg under the tongue every 6 (six) hours as needed for cramping.    . hyoscyamine (LEVSIN, ANASPAZ) 0.125 MG tablet Take 0.125 mg by mouth every 4 (four) hours as needed.    . lidocaine-prilocaine (EMLA) cream Apply 1 application topically as needed. 30 g 0  . LORazepam (ATIVAN) 0.5 MG tablet Take 1 tablet (0.5 mg total) by mouth at bedtime. 30 tablet 0  . ondansetron (ZOFRAN) 8 MG tablet Take one tablet twice a day starting the evening of chemo and continuing through the evening of day 3 40 tablet 1  . prochlorperazine  (COMPAZINE) 10 MG tablet Take 1 tablet (10 mg total) by mouth every 6 (six) hours as needed for nausea or vomiting. 30 tablet 0  . tobramycin-dexamethasone (TOBRADEX) ophthalmic solution Place 1 drop into both eyes every 4 (four) hours while awake. 5 mL 0   No current facility-administered medications for this visit.    OBJECTIVE: middle-aged white woman who appears well Filed Vitals:   11/23/14 1058  BP: 146/84  Pulse: 74  Temp: 98 F (36.7 C)  Resp: 18     Body mass index is 20.35 kg/(m^2).    ECOG FS:1 - Symptomatic but completely ambulatory  Sclerae unicteric, pupils round and equal Oropharynx clear and moist-- no thrush or other lesions No cervical or supraclavicular adenopathy Lungs no rales or rhonchi Heart regular rate and rhythm Abd soft, nontender, positive bowel sounds MSK no focal spinal tenderness, no upper extremity lymphedema Neuro: nonfocal, well oriented, appropriate affect Breasts: Deferred  LAB RESULTS:  CMP     Component Value Date/Time   NA 140 11/15/2014 0830   NA 140 11/01/2014 1023   K 4.2 11/15/2014 0830   K 4.2 11/01/2014 1023   CL 104 11/01/2014 1023   CO2 24 11/15/2014 0830   CO2 26 11/01/2014 1023   GLUCOSE 134 11/15/2014 0830   GLUCOSE 93 11/01/2014 1023   BUN 13.1 11/15/2014 0830   BUN 10 11/01/2014 1023   CREATININE 0.9 11/15/2014 0830   CREATININE 0.85 11/01/2014 1023   CALCIUM 10.4 11/15/2014 0830   CALCIUM 9.9 11/01/2014 1023   PROT 7.0 11/15/2014 0830   ALBUMIN 4.2 11/15/2014 0830   AST 14 11/15/2014 0830   ALT 11 11/15/2014 0830   ALKPHOS 70 11/15/2014 0830   BILITOT 0.53 11/15/2014 0830   GFRNONAA >60 11/01/2014 1023   GFRAA >60 11/01/2014 1023    INo results found for: SPEP, UPEP  Lab Results  Component Value Date   WBC 35.3* 11/23/2014   NEUTROABS 30.7* 11/23/2014   HGB 15.3 11/23/2014   HCT 46.0 11/23/2014   MCV 94.8 11/23/2014   PLT 181 11/23/2014      Chemistry      Component Value Date/Time   NA 140  11/15/2014 0830   NA 140 11/01/2014 1023   K 4.2 11/15/2014 0830   K 4.2 11/01/2014 1023   CL 104 11/01/2014 1023   CO2 24 11/15/2014 0830   CO2 26 11/01/2014 1023   BUN 13.1 11/15/2014 0830   BUN 10 11/01/2014 1023   CREATININE 0.9 11/15/2014 0830   CREATININE 0.85 11/01/2014 1023  Component Value Date/Time   CALCIUM 10.4 11/15/2014 0830   CALCIUM 9.9 11/01/2014 1023   ALKPHOS 70 11/15/2014 0830   AST 14 11/15/2014 0830   ALT 11 11/15/2014 0830   BILITOT 0.53 11/15/2014 0830       No results found for: LABCA2  No components found for: LABCA125  No results for input(s): INR in the last 168 hours.  Urinalysis No results found for: COLORURINE, APPEARANCEUR, LABSPEC, PHURINE, GLUCOSEU, HGBUR, BILIRUBINUR, KETONESUR, PROTEINUR, UROBILINOGEN, NITRITE, LEUKOCYTESUR  STUDIES: Dg Chest Port 1 View  11/02/2014   CLINICAL DATA:  Status post port insertion  EXAM: PORTABLE CHEST - 1 VIEW  COMPARISON:  01/08/2010  FINDINGS: Cardiac shadow is within normal limits. A new left-sided chest wall port is seen with the catheter tip in the distal superior vena cava. The lungs are well-aerated without evidence of pneumothorax. No focal infiltrate is seen. No bony abnormality is noted.  IMPRESSION: No evidence of post port placement pneumothorax.   Electronically Signed   By: Inez Catalina M.D.   On: 11/02/2014 09:15   Dg Fluoro Guide Cv Line-no Report  11/02/2014   CLINICAL DATA:    FLOURO GUIDE CV LINE  Fluoroscopy was utilized by the requesting physician.  No radiographic  interpretation.     ASSESSMENT: 69 y.o. [place] woman status post right breast biopsy 10/16/2014 for a clinical T3 N0, stage IIA invasive ductal carcinoma, grade 2 or 3, estrogen and progesterone receptor positive, with an MIB-1 of 20%, and HER-2 amplified, with a signals ratio of 3.60  (1) chemotherapy with anti-HER-2 immunotherapy to start 11/05/2014. This will consist of carboplatin, docetaxel, trastuzumab and  pertuzumab given every 21 days 6, with Neulasta support  (2) trastuzumab will be continued to complete 1 year  (a) echocardiogram 10/31/2014 shows an ejection fraction of 55-60%.  (3) definitive surgery to follow chemotherapy  (4) adjuvant radiation to follow surgery as appropriate  (5) anti-estrogens to follow completion of local treatments.  PLAN: Avleen tolerated her first cycle of chemotherapy and immunotherapy remarkably well. The bony pain she had clearly is due to her very high white cell count, with the bone marrow trying to expand against the fixed walls of the bones.  We decided that she would try Tylenol 2 tablets 3 times a day for the pain and she can in addition take 2 Advil with food up to 3 times a day as well. She should start this on day 6 which is when she had the significant pain problem. She can continue until her pain has completely resolved.  She did well with the diarrhea, but I suggested she go ahead and get Imodium and have it in hand. If she starts having more frequent and more loose bowel movements she can take an Imodium beginning with a second loose bowel movement  We also discussed the thrush which she had. It is already getting better, but clearly it is going to recur. I suggested she start Diflucan 100 mg on day 4 at take it at least 4 days or if she has thrush until the thrush has completely cleared.  I think with these minor changes her second cycle will be easier than the first. I have put in an appointment for her to be seen the next day of treatment, which is June 23, and then she will see me the following week. At that point I will repeat a breast exam to ascertain for early response. If she has had a measurable response after 3  cycles we will not have to do a mid treatment MRI.  Chauncey Cruel, MD   11/23/2014 11:10 AM Medical Oncology and Hematology [facility] [address] [place], [place] [address] Tel. [phone]    Fax.  [phone]

## 2014-11-23 NOTE — Telephone Encounter (Signed)
Appointments made and avs pritned for patient °

## 2014-11-26 ENCOUNTER — Ambulatory Visit: Payer: Medicare Other

## 2014-11-26 ENCOUNTER — Other Ambulatory Visit: Payer: Medicare Other

## 2014-11-26 DIAGNOSIS — C50411 Malignant neoplasm of upper-outer quadrant of right female breast: Secondary | ICD-10-CM | POA: Diagnosis not present

## 2014-12-06 ENCOUNTER — Other Ambulatory Visit: Payer: Self-pay | Admitting: Oncology

## 2014-12-06 ENCOUNTER — Encounter: Payer: Self-pay | Admitting: Nurse Practitioner

## 2014-12-06 ENCOUNTER — Ambulatory Visit (HOSPITAL_BASED_OUTPATIENT_CLINIC_OR_DEPARTMENT_OTHER): Payer: Medicare Other

## 2014-12-06 ENCOUNTER — Other Ambulatory Visit (HOSPITAL_BASED_OUTPATIENT_CLINIC_OR_DEPARTMENT_OTHER): Payer: Medicare Other

## 2014-12-06 ENCOUNTER — Ambulatory Visit (HOSPITAL_BASED_OUTPATIENT_CLINIC_OR_DEPARTMENT_OTHER): Payer: Medicare Other | Admitting: Nurse Practitioner

## 2014-12-06 VITALS — BP 147/76 | HR 72 | Temp 97.6°F | Resp 18 | Ht 66.5 in | Wt 127.9 lb

## 2014-12-06 DIAGNOSIS — Z5112 Encounter for antineoplastic immunotherapy: Secondary | ICD-10-CM

## 2014-12-06 DIAGNOSIS — C50411 Malignant neoplasm of upper-outer quadrant of right female breast: Secondary | ICD-10-CM

## 2014-12-06 LAB — CBC WITH DIFFERENTIAL/PLATELET
BASO%: 0 % (ref 0.0–2.0)
BASOS ABS: 0 10*3/uL (ref 0.0–0.1)
EOS%: 0 % (ref 0.0–7.0)
Eosinophils Absolute: 0 10*3/uL (ref 0.0–0.5)
HCT: 41 % (ref 34.8–46.6)
HEMOGLOBIN: 14 g/dL (ref 11.6–15.9)
LYMPH%: 15 % (ref 14.0–49.7)
MCH: 32.1 pg (ref 25.1–34.0)
MCHC: 34.1 g/dL (ref 31.5–36.0)
MCV: 94 fL (ref 79.5–101.0)
MONO#: 0.5 10*3/uL (ref 0.1–0.9)
MONO%: 8.8 % (ref 0.0–14.0)
NEUT%: 76.2 % (ref 38.4–76.8)
NEUTROS ABS: 4.5 10*3/uL (ref 1.5–6.5)
PLATELETS: 274 10*3/uL (ref 145–400)
RBC: 4.36 10*6/uL (ref 3.70–5.45)
RDW: 12.6 % (ref 11.2–14.5)
WBC: 5.9 10*3/uL (ref 3.9–10.3)
lymph#: 0.9 10*3/uL (ref 0.9–3.3)

## 2014-12-06 LAB — COMPREHENSIVE METABOLIC PANEL (CC13)
ALBUMIN: 4.1 g/dL (ref 3.5–5.0)
ALT: 17 U/L (ref 0–55)
AST: 14 U/L (ref 5–34)
Alkaline Phosphatase: 79 U/L (ref 40–150)
Anion Gap: 7 mEq/L (ref 3–11)
BUN: 14.6 mg/dL (ref 7.0–26.0)
CALCIUM: 10 mg/dL (ref 8.4–10.4)
CHLORIDE: 106 meq/L (ref 98–109)
CO2: 26 mEq/L (ref 22–29)
Creatinine: 0.8 mg/dL (ref 0.6–1.1)
EGFR: 74 mL/min/{1.73_m2} — ABNORMAL LOW (ref >90)
Glucose: 154 mg/dl — ABNORMAL HIGH (ref 70–140)
Potassium: 4.1 mEq/L (ref 3.5–5.1)
SODIUM: 139 meq/L (ref 136–145)
TOTAL PROTEIN: 6.5 g/dL (ref 6.4–8.3)
Total Bilirubin: 0.51 mg/dL (ref 0.20–1.20)

## 2014-12-06 MED ORDER — DEXTROSE 5 % IV SOLN
75.0000 mg/m2 | Freq: Once | INTRAVENOUS | Status: AC
  Administered 2014-12-06: 130 mg via INTRAVENOUS
  Filled 2014-12-06: qty 13

## 2014-12-06 MED ORDER — ACETAMINOPHEN 325 MG PO TABS
ORAL_TABLET | ORAL | Status: AC
Start: 2014-12-06 — End: 2014-12-06
  Filled 2014-12-06: qty 2

## 2014-12-06 MED ORDER — DIPHENHYDRAMINE HCL 25 MG PO CAPS
ORAL_CAPSULE | ORAL | Status: AC
  Filled 2014-12-06: qty 1

## 2014-12-06 MED ORDER — SODIUM CHLORIDE 0.9 % IV SOLN
420.0000 mg | Freq: Once | INTRAVENOUS | Status: AC
  Administered 2014-12-06: 420 mg via INTRAVENOUS
  Filled 2014-12-06: qty 14

## 2014-12-06 MED ORDER — SODIUM CHLORIDE 0.9 % IV SOLN
389.0000 mg | Freq: Once | INTRAVENOUS | Status: AC
  Administered 2014-12-06: 390 mg via INTRAVENOUS
  Filled 2014-12-06: qty 39

## 2014-12-06 MED ORDER — ACETAMINOPHEN 325 MG PO TABS
650.0000 mg | ORAL_TABLET | Freq: Once | ORAL | Status: AC
  Administered 2014-12-06: 650 mg via ORAL

## 2014-12-06 MED ORDER — SODIUM CHLORIDE 0.9 % IV SOLN
Freq: Once | INTRAVENOUS | Status: AC
  Administered 2014-12-06: 11:00:00 via INTRAVENOUS
  Filled 2014-12-06: qty 8

## 2014-12-06 MED ORDER — SODIUM CHLORIDE 0.9 % IJ SOLN
10.0000 mL | INTRAMUSCULAR | Status: DC | PRN
Start: 2014-12-06 — End: 2014-12-06
  Administered 2014-12-06: 10 mL
  Filled 2014-12-06: qty 10

## 2014-12-06 MED ORDER — SODIUM CHLORIDE 0.9 % IV SOLN
6.0000 mg/kg | Freq: Once | INTRAVENOUS | Status: AC
  Administered 2014-12-06: 357 mg via INTRAVENOUS
  Filled 2014-12-06: qty 17

## 2014-12-06 MED ORDER — DIPHENHYDRAMINE HCL 25 MG PO CAPS
25.0000 mg | ORAL_CAPSULE | Freq: Once | ORAL | Status: AC
  Administered 2014-12-06: 25 mg via ORAL

## 2014-12-06 MED ORDER — SODIUM CHLORIDE 0.9 % IV SOLN
Freq: Once | INTRAVENOUS | Status: AC
  Administered 2014-12-06: 10:00:00 via INTRAVENOUS

## 2014-12-06 MED ORDER — HEPARIN SOD (PORK) LOCK FLUSH 100 UNIT/ML IV SOLN
500.0000 [IU] | Freq: Once | INTRAVENOUS | Status: AC | PRN
  Administered 2014-12-06: 500 [IU]
  Filled 2014-12-06: qty 5

## 2014-12-06 NOTE — Progress Notes (Signed)
Diana Dunn  [phone] Fax:(336) [phone]     ID: Diana Dunn DOB: 1946/04/15  MR#: [id number]  EQA#:[id number]  Patient Care Team: Harle Battiest, MD as PCP - General (Obstetrics and Gynecology) Autumn Messing III, MD as Consulting Physician (General Surgery) Chauncey Cruel, MD as Consulting Physician (Oncology) Eppie Gibson, MD as Attending Physician (Radiation Oncology) Mauro Kaufmann, RN as Registered Nurse Rockwell Germany, RN as Registered Nurse Holley Bouche, NP as Nurse Practitioner (Nurse Practitioner) Lona Kettle, MD (Family Medicine) PCP: Harle Battiest, MD OTHER MD:  CHIEF COMPLAINT: Triple positive breast cancer  CURRENT TREATMENT: Chemotherapy and anti-HER-2 immunotherapy  BREAST CANCER HISTORY: From the original intake note:  Diana Dunn had not had a mammogram for approximately 4 years. Sometime mid 2015 Diana Dunn had an area of redness and tenderness in the right breast and Diana Dunn brought this to her physician's attention. Diana Dunn was treated with antibiotics and this completely cleared.Marland Kitchen  Approximately mid April, Diana Dunn again developed redness over the right breast. This was very focal, and it did not look "as red" as the previous time. Diana Dunn brought it to Dr. Harrington Challenger is attention, and was treated with antibiotics. Bilateral mammography with tomosynthesis and right breast ultrasonography was also obtained on 09/19/2014. There was a focal opacity with irregular margins in the upper outer right breast with some central calcifications. There was mild architectural distortion associated with this. On exam Dr. [name] noted a firm palpable tender mass lateral to the right areola with overlying erythema. On ultrasonography there was an irregular hypoechoic mass measuring 2.4 cm.  Repeat right breast ultrasonography after the patient completed her antibiotic course was performed 10/04/2014. On exam there was still a palpable lobulated mass lateral to the right nipple and  by ultrasound this measured 2.6 cm. It was felt to be a possible abscess and a second course of antibiotics, now with Septra, was undertaken. After that treatment, repeat ultrasonography 10/16/2014 continued to show the palpable mass and ultrasonography now found the mass to measure 2.9 cm maximally.  Accordingly biopsy of the mass in question was obtained that same day, 10/16/2014, and showed ([id number]) and invasive ductal carcinoma, grade 2 or 3, estrogen receptor 90% positive, progesterone receptor 80% positive, both with strong staining intensity, with an MIB-1 of 20%, and with HER-2 amplification, the signals ratio being 3.60 and the number per cell 4.50.  Bilateral breast MRI 10/23/2014 showed a breast density to be category B. In the right breast there was an irregular enhancing mass at the 9:30 o'clock position measuring 3.1 cm. There were no additional worrisome masses in the right breast, no findings of concern in the left breast, and no abnormal appearing lymph nodes.  The patient's subsequent history is as detailed below  INTERVAL HISTORY: Diana Dunn returns today for follow-up of her HER-2 positive breast cancer. Today is day 1 cycle 2 of 6 planned cycles of carboplatin, docetaxel, trastuzumab and pertuzumab.  REVIEW OF SYSTEMS: Diana Dunn denies fevers or chills. Her nausea was minimal. Her stools are "mushy" but no diarrhea. Her appetite is fair despite taste changes. Diana Dunn has heartburn managed with pepcid and gaviscon.  Her thrush resolved, but Diana Dunn now thinks Diana Dunn is experiencing vaginal yeast. Diana Dunn denies mouth sores, rashes, or neuropathy symptoms. Diana Dunn continues to walk 30 minutes daily. Diana Dunn experiences mild fatigue. A detailed review of systems is otherwise stable.   PAST MEDICAL HISTORY: Past Medical History  Diagnosis Date  . Breast cancer of upper-outer quadrant of right female breast 10/19/2014  .  Breast cancer   . History of hiatal hernia   . IBS (irritable bowel syndrome)   .  Lactose intolerance   . Cancer     RIGHT BREAST CANCER  Irritable bowel symptoms  PAST SURGICAL HISTORY: Past Surgical History  Procedure Laterality Date  . Breast surgery      BIOPSY OF BREAST  . Colonoscopy    . Portacath placement Left 11/02/2014    Procedure: INSERTION PORT-A-CATH;  Surgeon: Autumn Messing III, MD;  Location: [facility];  Service: General;  Laterality: Left;    FAMILY HISTORY No family history on file. The patient's father died at the age of 28 from a myocardial infarction. The patient's mother died at the age of 17 following a stroke. The patient had 2 brothers. One had Down's syndrome. Diana Dunn had no sisters. The only breast cancer in the family was the patient's father's only sister was diagnosed with breast cancer in her 83s. There is no history of ovarian cancer in the family to the patient's knowledge   GYNECOLOGIC HISTORY:  No LMP recorded. Patient is postmenopausal.  menarche age 68, first live birth age 69, Diana Dunn is [place] P2. Diana Dunn went through the change of life in late 1999. Diana Dunn did not take hormone replacement. Diana Dunn took birth control pills for less than 2 years in her 75A, with no complications   SOCIAL HISTORY:  Diana Dunn is my neighbor. 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 Diana Dunn died from widely metastatic malignant melanoma within 4 months of diagnosis some years ago. The patient lives with her [name] and his wife, who is expecting their first child late November 2016. The other son, Diana Dunn, lives in [place]. Both sons are appraised her's. The patient has 2 grandchildren and as just noted one on the way. The patient attends the [place]: Not in place. At the 10/24/2014 visit the patient was given the appropriate documents 2 complete and notarize at her discretion   HEALTH MAINTENANCE: History  Substance Use Topics  . Smoking status: Former Smoker    Types: Cigarettes    Quit date:  08/03/2014  . Smokeless tobacco: Not on file  . Alcohol Use: 3.6 oz/week    6 Glasses of wine per week     Colonoscopy: 2000?  PAP:  Bone density: On wrist, remote   Lipid panel:  Allergies  Allergen Reactions  . Aspirin     Stomach pain  . Aspirin Nausea Only    GI upset/pain    Current Outpatient Prescriptions  Medication Sig Dispense Refill  . dexamethasone (DECADRON) 4 MG tablet Take two tablets (8 mg) twice a day w food the day before chemo, then the day after chemo and the day after that; last dose the morning of the 4th day counting from chemo as day 1 40 tablet 1  . fluconazole (DIFLUCAN) 100 MG tablet Take one tablet daily starting day four from chemo and continuing for four days or until thrush resolves 30 tablet 1  . lidocaine-prilocaine (EMLA) cream Apply 1 application topically as needed. 30 g 0  . ondansetron (ZOFRAN) 8 MG tablet Take one tablet twice a day starting the evening of chemo and continuing through the evening of day 3 40 tablet 1  . prochlorperazine (COMPAZINE) 10 MG tablet Take 1 tablet (10 mg total) by mouth every 6 (six) hours as needed for nausea or vomiting. 30 tablet 0  .  hyoscyamine (ANASPAZ) 0.125 MG TBDP disintergrating tablet Place 0.125 mg under the tongue every 6 (six) hours as needed for cramping.    . hyoscyamine (LEVSIN, ANASPAZ) 0.125 MG tablet Take 0.125 mg by mouth every 4 (four) hours as needed.    Marland Kitchen LORazepam (ATIVAN) 0.5 MG tablet Take 1 tablet (0.5 mg total) by mouth at bedtime. (Patient not taking: Reported on 12/06/2014) 30 tablet 0  . tobramycin-dexamethasone (TOBRADEX) ophthalmic solution Place 1 drop into both eyes every 4 (four) hours while awake. (Patient not taking: Reported on 12/06/2014) 5 mL 0   No current facility-administered medications for this visit.    OBJECTIVE: middle-aged white Dunn who appears well Filed Vitals:   12/06/14 0847  BP: 147/76  Pulse: 72  Temp: 97.6 F (36.4 C)  Resp: 18     Body mass index is  20.34 kg/(m^2).    ECOG FS:1 - Symptomatic but completely ambulatory  Skin: warm, dry  HEENT: sclerae anicteric, conjunctivae pink, oropharynx clear. No thrush or mucositis.  Lymph Nodes: No cervical or supraclavicular lymphadenopathy  Lungs: clear to auscultation bilaterally, no rales, wheezes, or rhonci  Heart: regular rate and rhythm  Abdomen: round, soft, non tender, positive bowel sounds  Musculoskeletal: No focal spinal tenderness, no peripheral edema  Neuro: non focal, well oriented, positive affect  Breasts: deferred  LAB RESULTS:  CMP     Component Value Date/Time   NA 139 12/06/2014 0829   NA 140 11/01/2014 1023   K 4.1 12/06/2014 0829   K 4.2 11/01/2014 1023   CL 104 11/01/2014 1023   CO2 26 12/06/2014 0829   CO2 26 11/01/2014 1023   GLUCOSE 154* 12/06/2014 0829   GLUCOSE 93 11/01/2014 1023   BUN 14.6 12/06/2014 0829   BUN 10 11/01/2014 1023   CREATININE 0.8 12/06/2014 0829   CREATININE 0.85 11/01/2014 1023   CALCIUM 10.0 12/06/2014 0829   CALCIUM 9.9 11/01/2014 1023   PROT 6.5 12/06/2014 0829   ALBUMIN 4.1 12/06/2014 0829   AST 14 12/06/2014 0829   ALT 17 12/06/2014 0829   ALKPHOS 79 12/06/2014 0829   BILITOT 0.51 12/06/2014 0829   GFRNONAA >60 11/01/2014 1023   GFRAA >60 11/01/2014 1023    INo results found for: SPEP, UPEP  Lab Results  Component Value Date   WBC 5.9 12/06/2014   NEUTROABS 4.5 12/06/2014   HGB 14.0 12/06/2014   HCT 41.0 12/06/2014   MCV 94.0 12/06/2014   PLT 274 12/06/2014      Chemistry      Component Value Date/Time   NA 139 12/06/2014 0829   NA 140 11/01/2014 1023   K 4.1 12/06/2014 0829   K 4.2 11/01/2014 1023   CL 104 11/01/2014 1023   CO2 26 12/06/2014 0829   CO2 26 11/01/2014 1023   BUN 14.6 12/06/2014 0829   BUN 10 11/01/2014 1023   CREATININE 0.8 12/06/2014 0829   CREATININE 0.85 11/01/2014 1023      Component Value Date/Time   CALCIUM 10.0 12/06/2014 0829   CALCIUM 9.9 11/01/2014 1023   ALKPHOS 79  12/06/2014 0829   AST 14 12/06/2014 0829   ALT 17 12/06/2014 0829   BILITOT 0.51 12/06/2014 0829       No results found for: LABCA2  No components found for: LABCA125  No results for input(s): INR in the last 168 hours.  Urinalysis No results found for: COLORURINE, APPEARANCEUR, LABSPEC, PHURINE, GLUCOSEU, HGBUR, BILIRUBINUR, KETONESUR, PROTEINUR, UROBILINOGEN, NITRITE, LEUKOCYTESUR  STUDIES: No results found.  ASSESSMENT: 69 y.o. Diana Dunn status post right breast biopsy 10/16/2014 for a clinical T3 N0, stage IIA invasive ductal carcinoma, grade 2 or 3, estrogen and progesterone receptor positive, with an MIB-1 of 20%, and HER-2 amplified, with a signals ratio of 3.60  (1) chemotherapy with anti-HER-2 immunotherapy to start 11/05/2014. This will consist of carboplatin, docetaxel, trastuzumab and pertuzumab given every 21 days 6, with Neulasta support  (2) trastuzumab will be continued to complete 1 year  (a) echocardiogram 10/31/2014 shows an ejection fraction of 55-60%.  (3) definitive surgery to follow chemotherapy  (4) adjuvant radiation to follow surgery as appropriate  (5) anti-estrogens to follow completion of local treatments.  PLAN: Diana Dunn is doing well today. The labs were reviewed in detail and were entirely stable. Diana Dunn will proceed with cycle 2 of carboplatin, docetaxel, trastuzumab, and pertuzumab as planned.   Diana Dunn is going to start the diflucan prescribed at her last visit early to combat her vaginal yeast as well.   Diana Dunn will return in 1 week for labs and a nadir visit. Diana Dunn understands and agrees with this plan. Diana Dunn knows the goal of treatment in her case is cure. Diana Dunn has been encouraged to call with any issues that might arise before her next visit here.  Laurie Panda, NP   12/06/2014 9:14 AM

## 2014-12-06 NOTE — Patient Instructions (Signed)
[facility] Discharge Instructions for Patients Receiving Chemotherapy  Today you received the following chemotherapy agents [facility]/Perjeta.   To help prevent nausea and vomiting after your treatment, we encourage you to take your nausea medication as directed.    If you develop nausea and vomiting that is not controlled by your nausea medication, call the clinic.   BELOW ARE SYMPTOMS THAT SHOULD BE REPORTED IMMEDIATELY:  *FEVER GREATER THAN 100.5 F  *CHILLS WITH OR WITHOUT FEVER  NAUSEA AND VOMITING THAT IS NOT CONTROLLED WITH YOUR NAUSEA MEDICATION  *UNUSUAL SHORTNESS OF BREATH  *UNUSUAL BRUISING OR BLEEDING  TENDERNESS IN MOUTH AND THROAT WITH OR WITHOUT PRESENCE OF ULCERS  *URINARY PROBLEMS  *BOWEL PROBLEMS  UNUSUAL RASH Items with * indicate a potential emergency and should be followed up as soon as possible.  Feel free to call the clinic you have any questions or concerns. The clinic phone number is [phone].  Please show the [facility] at check-in to the Emergency Department and triage nurse.

## 2014-12-07 ENCOUNTER — Encounter: Payer: Self-pay | Admitting: *Deleted

## 2014-12-08 ENCOUNTER — Ambulatory Visit (HOSPITAL_BASED_OUTPATIENT_CLINIC_OR_DEPARTMENT_OTHER): Payer: Medicare Other

## 2014-12-08 VITALS — BP 139/62 | HR 67 | Temp 97.3°F | Resp 18

## 2014-12-08 DIAGNOSIS — C50411 Malignant neoplasm of upper-outer quadrant of right female breast: Secondary | ICD-10-CM | POA: Diagnosis present

## 2014-12-08 DIAGNOSIS — Z5189 Encounter for other specified aftercare: Secondary | ICD-10-CM

## 2014-12-08 MED ORDER — PEGFILGRASTIM INJECTION 6 MG/0.6ML ~~LOC~~
6.0000 mg | PREFILLED_SYRINGE | Freq: Once | SUBCUTANEOUS | Status: AC
  Administered 2014-12-08: 6 mg via SUBCUTANEOUS

## 2014-12-08 NOTE — Patient Instructions (Signed)
Pegfilgrastim injection What is this medicine? PEGFILGRASTIM (peg fil GRA stim) is a long-acting granulocyte colony-stimulating factor that stimulates the growth of neutrophils, a type of white blood cell important in the body's fight against infection. It is used to reduce the incidence of fever and infection in patients with certain types of cancer who are receiving chemotherapy that affects the bone marrow. This medicine may be used for other purposes; ask your health care provider or pharmacist if you have questions. COMMON BRAND NAME(S): Neulasta What should I tell my health care provider before I take this medicine? They need to know if you have any of these conditions: -latex allergy -ongoing radiation therapy -sickle cell disease -skin reactions to acrylic adhesives (On-Body Injector only) -an unusual or allergic reaction to pegfilgrastim, filgrastim, other medicines, foods, dyes, or preservatives -pregnant or trying to get pregnant -breast-feeding How should I use this medicine? This medicine is for injection under the skin. If you get this medicine at home, you will be taught how to prepare and give the pre-filled syringe or how to use the On-body Injector. Refer to the patient Instructions for Use for detailed instructions. Use exactly as directed. Take your medicine at regular intervals. Do not take your medicine more often than directed. It is important that you put your used needles and syringes in a special sharps container. Do not put them in a trash can. If you do not have a sharps container, call your pharmacist or healthcare provider to get one. Talk to your pediatrician regarding the use of this medicine in children. Special care may be needed. Overdosage: If you think you have taken too much of this medicine contact a poison control center or emergency room at once. NOTE: This medicine is only for you. Do not share this medicine with others. What if I miss a dose? It is  important not to miss your dose. Call your doctor or health care professional if you miss your dose. If you miss a dose due to an On-body Injector failure or leakage, a new dose should be administered as soon as possible using a single prefilled syringe for manual use. What may interact with this medicine? Interactions have not been studied. Give your health care provider a list of all the medicines, herbs, non-prescription drugs, or dietary supplements you use. Also tell them if you smoke, drink alcohol, or use illegal drugs. Some items may interact with your medicine. This list may not describe all possible interactions. Give your health care provider a list of all the medicines, herbs, non-prescription drugs, or dietary supplements you use. Also tell them if you smoke, drink alcohol, or use illegal drugs. Some items may interact with your medicine. What should I watch for while using this medicine? You may need blood work done while you are taking this medicine. If you are going to need a MRI, CT scan, or other procedure, tell your doctor that you are using this medicine (On-Body Injector only). What side effects may I notice from receiving this medicine? Side effects that you should report to your doctor or health care professional as soon as possible: -allergic reactions like skin rash, itching or hives, swelling of the face, lips, or tongue -dizziness -fever -pain, redness, or irritation at site where injected -pinpoint red spots on the skin -shortness of breath or breathing problems -stomach or side pain, or pain at the shoulder -swelling -tiredness -trouble passing urine Side effects that usually do not require medical attention (report to your doctor   or health care professional if they continue or are bothersome): -bone pain -muscle pain This list may not describe all possible side effects. Call your doctor for medical advice about side effects. You may report side effects to FDA at  1-800-FDA-1088. Where should I keep my medicine? Keep out of the reach of children. Store pre-filled syringes in a refrigerator between 2 and 8 degrees C (36 and 46 degrees F). Do not freeze. Keep in carton to protect from light. Throw away this medicine if it is left out of the refrigerator for more than 48 hours. Throw away any unused medicine after the expiration date. NOTE: This sheet is a summary. It may not cover all possible information. If you have questions about this medicine, talk to your doctor, pharmacist, or health care provider.  2015, Elsevier/Gold Standard. (2013-08-31 16:14:05)  

## 2014-12-11 ENCOUNTER — Encounter: Payer: Self-pay | Admitting: Oncology

## 2014-12-11 ENCOUNTER — Telehealth: Payer: Self-pay | Admitting: Oncology

## 2014-12-11 ENCOUNTER — Other Ambulatory Visit: Payer: Self-pay | Admitting: *Deleted

## 2014-12-11 ENCOUNTER — Other Ambulatory Visit: Payer: Self-pay

## 2014-12-11 ENCOUNTER — Telehealth: Payer: Self-pay | Admitting: *Deleted

## 2014-12-11 NOTE — Progress Notes (Deleted)
[facility]  Telephone:(336) [phone] Fax:(336) [phone]     ID: ZABRIA LISS DOB: 1946/01/27  MR#: [id number]  LEX#:[id number]  Patient Care Team: Harle Battiest, MD as PCP - General (Obstetrics and Gynecology) Autumn Messing III, MD as Consulting Physician (General Surgery) Chauncey Cruel, MD as Consulting Physician (Oncology) Eppie Gibson, MD as Attending Physician (Radiation Oncology) Mauro Kaufmann, RN as Registered Nurse Rockwell Germany, RN as Registered Nurse Holley Bouche, NP as Nurse Practitioner (Nurse Practitioner) Lona Kettle, MD (Family Medicine) PCP: Harle Battiest, MD OTHER MD:  CHIEF COMPLAINT: Triple positive breast cancer  CURRENT TREATMENT: Chemotherapy and anti-HER-2 immunotherapy  BREAST CANCER HISTORY: From the original intake note:  Minetta had not had a mammogram for approximately 4 years. Sometime mid 2015 she had an area of redness and tenderness in the right breast and she brought this to her physician's attention. She was treated with antibiotics and this completely cleared.Marland Kitchen  Approximately mid April, she again developed redness over the right breast. This was very focal, and it did not look "as red" as the previous time. She brought it to Dr. Harrington Challenger is attention, and was treated with antibiotics. Bilateral mammography with tomosynthesis and right breast ultrasonography was also obtained on 09/19/2014. There was a focal opacity with irregular margins in the upper outer right breast with some central calcifications. There was mild architectural distortion associated with this. On exam Dr. [name] noted a firm palpable tender mass lateral to the right areola with overlying erythema. On ultrasonography there was an irregular hypoechoic mass measuring 2.4 cm.  Repeat right breast ultrasonography after the patient completed her antibiotic course was performed 10/04/2014. On exam there was still a palpable lobulated mass lateral to the right nipple and  by ultrasound this measured 2.6 cm. It was felt to be a possible abscess and a second course of antibiotics, now with Septra, was undertaken. After that treatment, repeat ultrasonography 10/16/2014 continued to show the palpable mass and ultrasonography now found the mass to measure 2.9 cm maximally.  Accordingly biopsy of the mass in question was obtained that same day, 10/16/2014, and showed ([id number]) and invasive ductal carcinoma, grade 2 or 3, estrogen receptor 90% positive, progesterone receptor 80% positive, both with strong staining intensity, with an MIB-1 of 20%, and with HER-2 amplification, the signals ratio being 3.60 and the number per cell 4.50.  Bilateral breast MRI 10/23/2014 showed a breast density to be category B. In the right breast there was an irregular enhancing mass at the 9:30 o'clock position measuring 3.1 cm. There were no additional worrisome masses in the right breast, no findings of concern in the left breast, and no abnormal appearing lymph nodes.  The patient's subsequent history is as detailed below  INTERVAL HISTORY: Jacklynn did not show for appt  REVIEW OF SYSTEMS:  PAST MEDICAL HISTORY: Past Medical History  Diagnosis Date  . Breast cancer of upper-outer quadrant of right female breast 10/19/2014  . Breast cancer   . History of hiatal hernia   . IBS (irritable bowel syndrome)   . Lactose intolerance   . Cancer     RIGHT BREAST CANCER  Irritable bowel symptoms  PAST SURGICAL HISTORY: Past Surgical History  Procedure Laterality Date  . Breast surgery      BIOPSY OF BREAST  . Colonoscopy    . Portacath placement Left 11/02/2014    Procedure: INSERTION PORT-A-CATH;  Surgeon: Autumn Messing III, MD;  Location: [facility];  Service: General;  Laterality:  Left;    FAMILY HISTORY No family history on file. The patient's father died at the age of 4 from a myocardial infarction. The patient's mother died at the age of 20 following a stroke. The patient had 2  brothers. One had Down's syndrome. She had no sisters. The only breast cancer in the family was the patient's father's only sister was diagnosed with breast cancer in her 36s. There is no history of ovarian cancer in the family to the patient's knowledge   GYNECOLOGIC HISTORY:  No LMP recorded. Patient is postmenopausal.  menarche age 69, first live birth age 61, she is [place] P2. She went through the change of life in late 1999. She did not take hormone replacement. She took birth control pills for less than 2 years in her 69F, with no complications   SOCIAL HISTORY:  Kalla is my neighbor. 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 Bill died from widely metastatic malignant melanoma within 4 months of diagnosis some years ago. The patient lives with her son [name] and his wife, who is expecting their first child late November 2016. The other son, [name], lives in [place]. Both sons are appraised her's. The patient has 2 grandchildren and as just noted one on the way. The patient attends the [place]: Not in place. At the 10/24/2014 visit the patient was given the appropriate documents 2 complete and notarize at her discretion   HEALTH MAINTENANCE: History  Substance Use Topics  . Smoking status: Former Smoker    Types: Cigarettes    Quit date: 08/03/2014  . Smokeless tobacco: Not on file  . Alcohol Use: 3.6 oz/week    6 Glasses of wine per week     Colonoscopy: 2000?  PAP:  Bone density: On wrist, remote   Lipid panel:  Allergies  Allergen Reactions  . Aspirin     Stomach pain  . Aspirin Nausea Only    GI upset/pain    Current Outpatient Prescriptions  Medication Sig Dispense Refill  . dexamethasone (DECADRON) 4 MG tablet Take two tablets (8 mg) twice a day w food the day before chemo, then the day after chemo and the day after that; last dose the morning of the 4th day counting from chemo as day 1 40  tablet 1  . fluconazole (DIFLUCAN) 100 MG tablet Take one tablet daily starting day four from chemo and continuing for four days or until thrush resolves 30 tablet 1  . hyoscyamine (ANASPAZ) 0.125 MG TBDP disintergrating tablet Place 0.125 mg under the tongue every 6 (six) hours as needed for cramping.    . hyoscyamine (LEVSIN, ANASPAZ) 0.125 MG tablet Take 0.125 mg by mouth every 4 (four) hours as needed.    . lidocaine-prilocaine (EMLA) cream Apply 1 application topically as needed. 30 g 0  . LORazepam (ATIVAN) 0.5 MG tablet Take 1 tablet (0.5 mg total) by mouth at bedtime. (Patient not taking: Reported on 12/06/2014) 30 tablet 0  . ondansetron (ZOFRAN) 8 MG tablet Take one tablet twice a day starting the evening of chemo and continuing through the evening of day 3 40 tablet 1  . prochlorperazine (COMPAZINE) 10 MG tablet Take 1 tablet (10 mg total) by mouth every 6 (six) hours as needed for nausea or vomiting. 30 tablet 0  . tobramycin-dexamethasone (TOBRADEX) ophthalmic solution Place 1 drop into both eyes every 4 (four) hours while awake. (Patient not  taking: Reported on 12/06/2014) 5 mL 0   No current facility-administered medications for this visit.    OBJECTIVE: There were no vitals filed for this visit.   There is no weight on file to calculate BMI.    ECOG FS:1 - Symptomatic but completely ambulatory    LAB RESULTS:  CMP     Component Value Date/Time   NA 139 12/06/2014 0829   NA 140 11/01/2014 1023   K 4.1 12/06/2014 0829   K 4.2 11/01/2014 1023   CL 104 11/01/2014 1023   CO2 26 12/06/2014 0829   CO2 26 11/01/2014 1023   GLUCOSE 154* 12/06/2014 0829   GLUCOSE 93 11/01/2014 1023   BUN 14.6 12/06/2014 0829   BUN 10 11/01/2014 1023   CREATININE 0.8 12/06/2014 0829   CREATININE 0.85 11/01/2014 1023   CALCIUM 10.0 12/06/2014 0829   CALCIUM 9.9 11/01/2014 1023   PROT 6.5 12/06/2014 0829   ALBUMIN 4.1 12/06/2014 0829   AST 14 12/06/2014 0829   ALT 17 12/06/2014 0829    ALKPHOS 79 12/06/2014 0829   BILITOT 0.51 12/06/2014 0829   GFRNONAA >60 11/01/2014 1023   GFRAA >60 11/01/2014 1023    INo results found for: SPEP, UPEP  Lab Results  Component Value Date   WBC 5.9 12/06/2014   NEUTROABS 4.5 12/06/2014   HGB 14.0 12/06/2014   HCT 41.0 12/06/2014   MCV 94.0 12/06/2014   PLT 274 12/06/2014      Chemistry      Component Value Date/Time   NA 139 12/06/2014 0829   NA 140 11/01/2014 1023   K 4.1 12/06/2014 0829   K 4.2 11/01/2014 1023   CL 104 11/01/2014 1023   CO2 26 12/06/2014 0829   CO2 26 11/01/2014 1023   BUN 14.6 12/06/2014 0829   BUN 10 11/01/2014 1023   CREATININE 0.8 12/06/2014 0829   CREATININE 0.85 11/01/2014 1023      Component Value Date/Time   CALCIUM 10.0 12/06/2014 0829   CALCIUM 9.9 11/01/2014 1023   ALKPHOS 79 12/06/2014 0829   AST 14 12/06/2014 0829   ALT 17 12/06/2014 0829   BILITOT 0.51 12/06/2014 0829       No results found for: LABCA2  No components found for: LABCA125  No results for input(s): INR in the last 168 hours.  Urinalysis No results found for: COLORURINE, APPEARANCEUR, LABSPEC, PHURINE, GLUCOSEU, HGBUR, BILIRUBINUR, KETONESUR, PROTEINUR, UROBILINOGEN, NITRITE, LEUKOCYTESUR  STUDIES: No results found.  ASSESSMENT: 69 y.o. [place] woman status post right breast biopsy 10/16/2014 for a clinical T3 N0, stage IIA invasive ductal carcinoma, grade 2 or 3, estrogen and progesterone receptor positive, with an MIB-1 of 20%, and HER-2 amplified, with a signals ratio of 3.60  (1) chemotherapy with anti-HER-2 immunotherapy to start 11/05/2014. This will consist of carboplatin, docetaxel, trastuzumab and pertuzumab given every 21 days 6, with Neulasta support  (2) trastuzumab will be continued to complete 1 year  (a) echocardiogram 10/31/2014 shows an ejection fraction of 55-60%.  (3) definitive surgery to follow chemotherapy  (4) adjuvant radiation to follow surgery as appropriate  (5)  anti-estrogens to follow completion of local treatments.  PLAN: Patient did not show  Anusha Claus C, MD   12/11/2014 7:59 AM

## 2014-12-11 NOTE — Telephone Encounter (Signed)
Received call from pt. Pt missed appt today b/c she overslept. She said this last chemo had her feeling more fatigued so she was sleeping more and woke up late.Pt was very apologetic. I told pt her appt has been r/s for tomorrow 12/12/14. I told her to come for labs at 9:30a and she will see Dr. Jana Hakim at 10:00a. Pt verbalized understanding. No further concerns.

## 2014-12-11 NOTE — Telephone Encounter (Signed)
Labs/ov added per 06/28, Pt overslept r/s pt is aware of new D/T... KJ

## 2014-12-12 ENCOUNTER — Ambulatory Visit (HOSPITAL_BASED_OUTPATIENT_CLINIC_OR_DEPARTMENT_OTHER): Payer: Medicare Other | Admitting: Oncology

## 2014-12-12 ENCOUNTER — Telehealth: Payer: Self-pay | Admitting: Oncology

## 2014-12-12 ENCOUNTER — Other Ambulatory Visit (HOSPITAL_BASED_OUTPATIENT_CLINIC_OR_DEPARTMENT_OTHER): Payer: Medicare Other

## 2014-12-12 VITALS — BP 144/70 | HR 67 | Temp 97.9°F | Resp 98 | Ht 66.5 in | Wt 125.6 lb

## 2014-12-12 DIAGNOSIS — C50411 Malignant neoplasm of upper-outer quadrant of right female breast: Secondary | ICD-10-CM

## 2014-12-12 DIAGNOSIS — R197 Diarrhea, unspecified: Secondary | ICD-10-CM | POA: Diagnosis not present

## 2014-12-12 DIAGNOSIS — B370 Candidal stomatitis: Secondary | ICD-10-CM

## 2014-12-12 DIAGNOSIS — C50811 Malignant neoplasm of overlapping sites of right female breast: Secondary | ICD-10-CM

## 2014-12-12 DIAGNOSIS — R5383 Other fatigue: Secondary | ICD-10-CM

## 2014-12-12 LAB — COMPREHENSIVE METABOLIC PANEL (CC13)
ALBUMIN: 4 g/dL (ref 3.5–5.0)
ALK PHOS: 76 U/L (ref 40–150)
ALT: 29 U/L (ref 0–55)
AST: 15 U/L (ref 5–34)
Anion Gap: 10 mEq/L (ref 3–11)
BUN: 9.2 mg/dL (ref 7.0–26.0)
CO2: 25 mEq/L (ref 22–29)
Calcium: 9.4 mg/dL (ref 8.4–10.4)
Chloride: 104 mEq/L (ref 98–109)
Creatinine: 0.8 mg/dL (ref 0.6–1.1)
EGFR: 79 mL/min/{1.73_m2} — ABNORMAL LOW (ref >90)
Glucose: 102 mg/dl (ref 70–140)
POTASSIUM: 3.5 meq/L (ref 3.5–5.1)
SODIUM: 139 meq/L (ref 136–145)
Total Bilirubin: 0.83 mg/dL (ref 0.20–1.20)
Total Protein: 6.1 g/dL — ABNORMAL LOW (ref 6.4–8.3)

## 2014-12-12 LAB — CBC WITH DIFFERENTIAL/PLATELET
BASO%: 1 % (ref 0.0–2.0)
BASOS ABS: 0 10*3/uL (ref 0.0–0.1)
EOS%: 2.3 % (ref 0.0–7.0)
Eosinophils Absolute: 0.1 10*3/uL (ref 0.0–0.5)
HCT: 40.4 % (ref 34.8–46.6)
HEMOGLOBIN: 13.5 g/dL (ref 11.6–15.9)
LYMPH#: 0.9 10*3/uL (ref 0.9–3.3)
LYMPH%: 40.7 % (ref 14.0–49.7)
MCH: 31.8 pg (ref 25.1–34.0)
MCHC: 33.4 g/dL (ref 31.5–36.0)
MCV: 94.9 fL (ref 79.5–101.0)
MONO#: 0.3 10*3/uL (ref 0.1–0.9)
MONO%: 11.1 % (ref 0.0–14.0)
NEUT#: 1 10*3/uL — ABNORMAL LOW (ref 1.5–6.5)
NEUT%: 44.9 % (ref 38.4–76.8)
Platelets: 136 10*3/uL — ABNORMAL LOW (ref 145–400)
RBC: 4.26 10*6/uL (ref 3.70–5.45)
RDW: 12.7 % (ref 11.2–14.5)
WBC: 2.3 10*3/uL — ABNORMAL LOW (ref 3.9–10.3)

## 2014-12-12 NOTE — Telephone Encounter (Signed)
Patient saw  dr Doris Cheadle today and it looks like the last pof was not completed,i have adjusted her 7/14 to 7/13,added inj as well  And added 7/21 and pritned a new avs  anne

## 2014-12-12 NOTE — Progress Notes (Signed)
[facility]  [phone] [phone]     ID: ONDINE GEMME DOB: Mar 22, 1946  MR#: [id number]  BHA#:[id number]  Patient Care Team: Harle Battiest, MD as PCP - General (Obstetrics and Gynecology) Autumn Messing III, MD as Consulting Physician (General Surgery) Chauncey Cruel, MD as Consulting Physician (Oncology) Eppie Gibson, MD as Attending Physician (Radiation Oncology) Mauro Kaufmann, RN as Registered Nurse Rockwell Germany, RN as Registered Nurse Holley Bouche, NP as Nurse Practitioner (Nurse Practitioner) Lona Kettle, MD (Family Medicine) PCP: Harle Battiest, MD OTHER MD:  CHIEF COMPLAINT: Triple positive breast cancer  CURRENT TREATMENT: Chemotherapy and anti-HER-2 immunotherapy  BREAST CANCER HISTORY: From the original intake note:  Philip had not had a mammogram for approximately 4 years. Sometime mid 2015 she had an area of redness and tenderness in the right breast and she brought this to her physician's attention. She was treated with antibiotics and this completely cleared.Marland Kitchen  Approximately mid April, she again developed redness over the right breast. This was very focal, and it did not look "as red" as the previous time. She brought it to Dr. Harrington Challenger is attention, and was treated with antibiotics. Bilateral mammography with tomosynthesis and right breast ultrasonography was also obtained on 09/19/2014. There was a focal opacity with irregular margins in the upper outer right breast with some central calcifications. There was mild architectural distortion associated with this. On exam Dr. [name] noted a firm palpable tender mass lateral to the right areola with overlying erythema. On ultrasonography there was an irregular hypoechoic mass measuring 2.4 cm.  Repeat right breast ultrasonography after the patient completed her antibiotic course was performed 10/04/2014. On exam there was still a palpable lobulated mass lateral to the right nipple and  by ultrasound this measured 2.6 cm. It was felt to be a possible abscess and a second course of antibiotics, now with Septra, was undertaken. After that treatment, repeat ultrasonography 10/16/2014 continued to show the palpable mass and ultrasonography now found the mass to measure 2.9 cm maximally.  Accordingly biopsy of the mass in question was obtained that same day, 10/16/2014, and showed ([id number]) and invasive ductal carcinoma, grade 2 or 3, estrogen receptor 90% positive, progesterone receptor 80% positive, both with strong staining intensity, with an MIB-1 of 20%, and with HER-2 amplification, the signals ratio being 3.60 and the number per cell 4.50.  Bilateral breast MRI 10/23/2014 showed a breast density to be category B. In the right breast there was an irregular enhancing mass at the 9:30 o'clock position measuring 3.1 cm. There were no additional worrisome masses in the right breast, no findings of concern in the left breast, and no abnormal appearing lymph nodes.  The patient's subsequent history is as detailed below  INTERVAL HISTORY: Lahna returns today for follow-up of her HER-2 positive breast cancer. Today is day 7 cycle 2 of 6 planned cycles of carboplatin, docetaxel, trastuzumab and pertuzumab. She was supposed to have been seen yesterday but she slept through the appointment. She just felt terribly tired for those 24 hours. Before that she was making an effort to at least walks a dog twice daily.  REVIEW OF SYSTEMS: Aside from the fatigue about Christie has also explained some thrush. She is taking Diflucan for that. She is having mild diarrhea from the pertuzumab, but she knows that he use Imodium. She is not having any peripheral neuropathy symptoms. There has been no rash and no fever. A detailed review of systems today was otherwise  stable.  PAST MEDICAL HISTORY: Past Medical History  Diagnosis Date  . Breast cancer of upper-outer quadrant of right female breast  10/19/2014  . Breast cancer   . History of hiatal hernia   . IBS (irritable bowel syndrome)   . Lactose intolerance   . Cancer     RIGHT BREAST CANCER  Irritable bowel symptoms  PAST SURGICAL HISTORY: Past Surgical History  Procedure Laterality Date  . Breast surgery      BIOPSY OF BREAST  . Colonoscopy    . Portacath placement Left 11/02/2014    Procedure: INSERTION PORT-A-CATH;  Surgeon: Autumn Messing III, MD;  Location: [facility];  Service: General;  Laterality: Left;    FAMILY HISTORY No family history on file. The patient's father died at the age of 42 from a myocardial infarction. The patient's mother died at the age of 40 following a stroke. The patient had 2 brothers. One had Down's syndrome. She had no sisters. The only breast cancer in the family was the patient's father's only sister was diagnosed with breast cancer in her 30s. There is no history of ovarian cancer in the family to the patient's knowledge   GYNECOLOGIC HISTORY:  No LMP recorded. Patient is postmenopausal.  menarche age 76, first live birth age 4, she is [place] P2. She went through the change of life in late 1999. She did not take hormone replacement. She took birth control pills for less than 2 years in her 89E, with no complications   SOCIAL HISTORY:  Shakendra is my neighbor. 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 Bill died from widely metastatic malignant melanoma within 4 months of diagnosis some years ago. The patient lives with her son [name] and his wife, who is expecting their first child late November 2016. The other son, [name], lives in [place]. Both sons are appraised her's. The patient has 2 grandchildren and as just noted one on the way. The patient attends the [employer]: Not in place. At the 10/24/2014 visit the patient was given the appropriate documents 2 complete and notarize at her discretion   HEALTH  MAINTENANCE: History  Substance Use Topics  . Smoking status: Former Smoker    Types: Cigarettes    Quit date: 08/03/2014  . Smokeless tobacco: Not on file  . Alcohol Use: 3.6 oz/week    6 Glasses of wine per week     Colonoscopy: 2000?  PAP:  Bone density: On wrist, remote   Lipid panel:  Allergies  Allergen Reactions  . Aspirin     Stomach pain  . Aspirin Nausea Only    GI upset/pain    Current Outpatient Prescriptions  Medication Sig Dispense Refill  . dexamethasone (DECADRON) 4 MG tablet Take two tablets (8 mg) twice a day w food the day before chemo, then the day after chemo and the day after that; last dose the morning of the 4th day counting from chemo as day 1 40 tablet 1  . fluconazole (DIFLUCAN) 100 MG tablet Take one tablet daily starting day four from chemo and continuing for four days or until thrush resolves 30 tablet 1  . hyoscyamine (ANASPAZ) 0.125 MG TBDP disintergrating tablet Place 0.125 mg under the tongue every 6 (six) hours as needed for cramping.    . hyoscyamine (LEVSIN, ANASPAZ) 0.125 MG tablet Take 0.125 mg by mouth every 4 (four) hours as needed.    Marland Kitchen  lidocaine-prilocaine (EMLA) cream Apply 1 application topically as needed. 30 g 0  . LORazepam (ATIVAN) 0.5 MG tablet Take 1 tablet (0.5 mg total) by mouth at bedtime. (Patient not taking: Reported on 12/06/2014) 30 tablet 0  . ondansetron (ZOFRAN) 8 MG tablet Take one tablet twice a day starting the evening of chemo and continuing through the evening of day 3 40 tablet 1  . prochlorperazine (COMPAZINE) 10 MG tablet Take 1 tablet (10 mg total) by mouth every 6 (six) hours as needed for nausea or vomiting. 30 tablet 0  . tobramycin-dexamethasone (TOBRADEX) ophthalmic solution Place 1 drop into both eyes every 4 (four) hours while awake. (Patient not taking: Reported on 12/06/2014) 5 mL 0   No current facility-administered medications for this visit.    OBJECTIVE: middle-aged white woman in no acute  distress Filed Vitals:   12/12/14 0942  BP: 144/70  Pulse: 67  Temp: 97.9 F (36.6 C)  Resp: 98     Body mass index is 19.97 kg/(m^2).    ECOG FS:1 - Symptomatic but completely ambulatory  Sclerae unicteric, pupils round and equal Oropharynx shows moderate thrush  No cervical or supraclavicular adenopathy Lungs no rales or rhonchi Heart regular rate and rhythm Abd soft, nontender, positive bowel sounds MSK no focal spinal tenderness, no upper extremity lymphedema Neuro: nonfocal, well oriented, appropriate affect Breasts: deferred    LAB RESULTS:  CMP     Component Value Date/Time   NA 139 12/12/2014 0931   NA 140 11/01/2014 1023   K 3.5 12/12/2014 0931   K 4.2 11/01/2014 1023   CL 104 11/01/2014 1023   CO2 25 12/12/2014 0931   CO2 26 11/01/2014 1023   GLUCOSE 102 12/12/2014 0931   GLUCOSE 93 11/01/2014 1023   BUN 9.2 12/12/2014 0931   BUN 10 11/01/2014 1023   CREATININE 0.8 12/12/2014 0931   CREATININE 0.85 11/01/2014 1023   CALCIUM 9.4 12/12/2014 0931   CALCIUM 9.9 11/01/2014 1023   PROT 6.1* 12/12/2014 0931   ALBUMIN 4.0 12/12/2014 0931   AST 15 12/12/2014 0931   ALT 29 12/12/2014 0931   ALKPHOS 76 12/12/2014 0931   BILITOT 0.83 12/12/2014 0931   GFRNONAA >60 11/01/2014 1023   GFRAA >60 11/01/2014 1023    INo results found for: SPEP, UPEP  Lab Results  Component Value Date   WBC 2.3* 12/12/2014   NEUTROABS 1.0* 12/12/2014   HGB 13.5 12/12/2014   HCT 40.4 12/12/2014   MCV 94.9 12/12/2014   PLT 136* 12/12/2014      Chemistry      Component Value Date/Time   NA 139 12/12/2014 0931   NA 140 11/01/2014 1023   K 3.5 12/12/2014 0931   K 4.2 11/01/2014 1023   CL 104 11/01/2014 1023   CO2 25 12/12/2014 0931   CO2 26 11/01/2014 1023   BUN 9.2 12/12/2014 0931   BUN 10 11/01/2014 1023   CREATININE 0.8 12/12/2014 0931   CREATININE 0.85 11/01/2014 1023      Component Value Date/Time   CALCIUM 9.4 12/12/2014 0931   CALCIUM 9.9 11/01/2014 1023    ALKPHOS 76 12/12/2014 0931   AST 15 12/12/2014 0931   ALT 29 12/12/2014 0931   BILITOT 0.83 12/12/2014 0931       No results found for: LABCA2  No components found for: LABCA125  No results for input(s): INR in the last 168 hours.  Urinalysis No results found for: COLORURINE, APPEARANCEUR, [place], [place], [place], [place], [place], [place], [place],  UROBILINOGEN, NITRITE, LEUKOCYTESUR  STUDIES: No results found.  ASSESSMENT: 69 y.o. [place] woman status post right breast biopsy 10/16/2014 for a clinical T3 N0, stage IIA invasive ductal carcinoma, grade 2 or 3, estrogen and progesterone receptor positive, with an MIB-1 of 20%, and HER-2 amplified, with a signals ratio of 3.60  (1) chemotherapy with anti-HER-2 immunotherapy to start 11/05/2014. This will consist of carboplatin, docetaxel, trastuzumab and pertuzumab given every 21 days 6, with Neulasta support  (2) trastuzumab will be continued to complete 1 year  (a) echocardiogram 10/31/2014 shows an ejection fraction of 55-60%.  (3) definitive surgery to follow chemotherapy  (4) adjuvant radiation to follow surgery as appropriate  (5) anti-estrogens to follow completion of local treatments.  PLAN: Chrissie is generally tolerating chemotherapy well and her counts today are excellent. She is managing the diarrhea and thrush appropriately.  The question is the fatigue. If this persists we will drop the dose of docetaxel by 25%. We will leave the other medications alone. We will make that decision when she returns to see [place] on July 13.  Otherwise the plan is to continue through a total of 6 cycles of the current treatment. She knows to call if any other problems develop before her next visit here.  Chauncey Cruel, MD   12/12/2014 10:44 AM

## 2014-12-14 ENCOUNTER — Telehealth: Payer: Self-pay | Admitting: *Deleted

## 2014-12-14 NOTE — Telephone Encounter (Signed)
TC from patient regarding her nausea medicines. She is feeling somewhat nauseated, 1 week out from her chemo. Reviewed her medicines with her. She has not taken her Compazine yet. Advised her to take compazine at the first feeling of nausea and every 6 hours as needed.  She had 1 episode of diarrhea. She does have Imodium and was able to verbalize how to take it. She knows to call if nausea persists-even over the weekend. Denies fever, vomiting. Is able to keep fluids, ice cream down.

## 2014-12-14 NOTE — Telephone Encounter (Signed)
Thank you- Diana Dunn

## 2014-12-18 ENCOUNTER — Ambulatory Visit: Payer: Medicare Other

## 2014-12-18 ENCOUNTER — Other Ambulatory Visit: Payer: Self-pay | Admitting: Oncology

## 2014-12-18 ENCOUNTER — Other Ambulatory Visit: Payer: Medicare Other

## 2014-12-21 ENCOUNTER — Other Ambulatory Visit: Payer: Self-pay | Admitting: *Deleted

## 2014-12-21 ENCOUNTER — Telehealth: Payer: Self-pay | Admitting: *Deleted

## 2014-12-21 ENCOUNTER — Other Ambulatory Visit: Payer: Self-pay | Admitting: Nurse Practitioner

## 2014-12-21 ENCOUNTER — Other Ambulatory Visit (HOSPITAL_BASED_OUTPATIENT_CLINIC_OR_DEPARTMENT_OTHER): Payer: Medicare Other

## 2014-12-21 DIAGNOSIS — R300 Dysuria: Secondary | ICD-10-CM

## 2014-12-21 DIAGNOSIS — C50411 Malignant neoplasm of upper-outer quadrant of right female breast: Secondary | ICD-10-CM

## 2014-12-21 DIAGNOSIS — R350 Frequency of micturition: Secondary | ICD-10-CM

## 2014-12-21 LAB — URINALYSIS, MICROSCOPIC - CHCC
BLOOD: NEGATIVE
Bilirubin (Urine): NEGATIVE
GLUCOSE UR CHCC: NEGATIVE mg/dL
Ketones: NEGATIVE mg/dL
NITRITE: NEGATIVE
PH: 5 (ref 4.6–8.0)
Protein: <30 mg/dL
Specific Gravity, Urine: 1.02 (ref 1.003–1.035)
UROBILINOGEN UR: 0.2 mg/dL (ref 0.2–1)

## 2014-12-21 MED ORDER — CIPROFLOXACIN HCL 500 MG PO TABS: 500.0000 mg | ORAL_TABLET | Freq: Two times a day (BID) | ORAL | Status: DC

## 2014-12-21 NOTE — Telephone Encounter (Signed)
Received call from pt concerning a possible UTI. Pt complains of burning with urination and frequency x 2 days. I advised pt to come in for a lab appt to give [place] a urine sample. Pt agreed and will come this afternoon.

## 2014-12-21 NOTE — Telephone Encounter (Signed)
Called pt with results of urinalysis.Explained to pt the results and told her a Rx for Cipro had been called to pharmacy by Towana Badger and she can pick it up. Pt verbalized understanding. Pt will see [place] on 12/26/14. Message to be forwarded to [facility].

## 2014-12-23 LAB — URINE CULTURE

## 2014-12-24 ENCOUNTER — Telehealth: Payer: Self-pay | Admitting: *Deleted

## 2014-12-24 NOTE — Telephone Encounter (Signed)
Pt called b/c she was concerned about whether cold like symptoms would stop her from receiving her Chemo on Wednesday. I asked pt if she had any fever at all and she denies any fever. Her symptoms are nasal drainage and a cough. I reassured the pt that these symptoms would not stop her from being treated. I did tell pt to continue to check temperature daily and to call me if she should start having any fever. Pt agreed and verbalized understanding. Pt also say she is feeling much better since taking antibiotics for UTI. No further concerns. Message to be forwarded to Gentry Fitz, NP.

## 2014-12-26 ENCOUNTER — Telehealth: Payer: Self-pay | Admitting: Nurse Practitioner

## 2014-12-26 ENCOUNTER — Encounter: Payer: Self-pay | Admitting: Nurse Practitioner

## 2014-12-26 ENCOUNTER — Ambulatory Visit (HOSPITAL_BASED_OUTPATIENT_CLINIC_OR_DEPARTMENT_OTHER): Payer: Medicare Other | Admitting: Nurse Practitioner

## 2014-12-26 ENCOUNTER — Other Ambulatory Visit (HOSPITAL_BASED_OUTPATIENT_CLINIC_OR_DEPARTMENT_OTHER): Payer: Medicare Other

## 2014-12-26 ENCOUNTER — Ambulatory Visit (HOSPITAL_BASED_OUTPATIENT_CLINIC_OR_DEPARTMENT_OTHER): Payer: Medicare Other

## 2014-12-26 VITALS — BP 157/74 | HR 85 | Temp 98.0°F | Resp 18 | Ht 66.5 in | Wt 122.8 lb

## 2014-12-26 DIAGNOSIS — C50811 Malignant neoplasm of overlapping sites of right female breast: Secondary | ICD-10-CM

## 2014-12-26 DIAGNOSIS — Z5111 Encounter for antineoplastic chemotherapy: Secondary | ICD-10-CM | POA: Diagnosis not present

## 2014-12-26 DIAGNOSIS — Z5112 Encounter for antineoplastic immunotherapy: Secondary | ICD-10-CM | POA: Diagnosis not present

## 2014-12-26 DIAGNOSIS — C50411 Malignant neoplasm of upper-outer quadrant of right female breast: Secondary | ICD-10-CM

## 2014-12-26 DIAGNOSIS — Z170 Estrogen receptor positive status [ER+]: Secondary | ICD-10-CM | POA: Diagnosis not present

## 2014-12-26 LAB — CBC WITH DIFFERENTIAL/PLATELET
BASO%: 0.1 % (ref 0.0–2.0)
Basophils Absolute: 0 10*3/uL (ref 0.0–0.1)
EOS%: 0 % (ref 0.0–7.0)
Eosinophils Absolute: 0 10*3/uL (ref 0.0–0.5)
HCT: 38.9 % (ref 34.8–46.6)
HEMOGLOBIN: 13.4 g/dL (ref 11.6–15.9)
LYMPH#: 0.8 10*3/uL — AB (ref 0.9–3.3)
LYMPH%: 16.1 % (ref 14.0–49.7)
MCH: 32.5 pg (ref 25.1–34.0)
MCHC: 34.3 g/dL (ref 31.5–36.0)
MCV: 94.7 fL (ref 79.5–101.0)
MONO#: 0.3 10*3/uL (ref 0.1–0.9)
MONO%: 5.1 % (ref 0.0–14.0)
NEUT%: 78.7 % — ABNORMAL HIGH (ref 38.4–76.8)
NEUTROS ABS: 4.1 10*3/uL (ref 1.5–6.5)
Platelets: 282 10*3/uL (ref 145–400)
RBC: 4.11 10*6/uL (ref 3.70–5.45)
RDW: 13.9 % (ref 11.2–14.5)
WBC: 5.2 10*3/uL (ref 3.9–10.3)

## 2014-12-26 LAB — COMPREHENSIVE METABOLIC PANEL (CC13)
ALT: 22 U/L (ref 0–55)
AST: 14 U/L (ref 5–34)
Albumin: 4.1 g/dL (ref 3.5–5.0)
Alkaline Phosphatase: 83 U/L (ref 40–150)
Anion Gap: 10 mEq/L (ref 3–11)
BILIRUBIN TOTAL: 0.51 mg/dL (ref 0.20–1.20)
BUN: 12.8 mg/dL (ref 7.0–26.0)
CALCIUM: 10.6 mg/dL — AB (ref 8.4–10.4)
CO2: 23 mEq/L (ref 22–29)
CREATININE: 0.9 mg/dL (ref 0.6–1.1)
Chloride: 106 mEq/L (ref 98–109)
EGFR: 70 mL/min/{1.73_m2} — ABNORMAL LOW (ref >90)
Glucose: 190 mg/dl — ABNORMAL HIGH (ref 70–140)
Potassium: 4 mEq/L (ref 3.5–5.1)
SODIUM: 139 meq/L (ref 136–145)
Total Protein: 6.6 g/dL (ref 6.4–8.3)

## 2014-12-26 MED ORDER — DEXAMETHASONE SODIUM PHOSPHATE 100 MG/10ML IJ SOLN
Freq: Once | INTRAMUSCULAR | Status: AC
  Administered 2014-12-26: 10:00:00 via INTRAVENOUS
  Filled 2014-12-26: qty 8

## 2014-12-26 MED ORDER — HEPARIN SOD (PORK) LOCK FLUSH 100 UNIT/ML IV SOLN
500.0000 [IU] | Freq: Once | INTRAVENOUS | Status: AC | PRN
  Administered 2014-12-26: 500 [IU]
  Filled 2014-12-26: qty 5

## 2014-12-26 MED ORDER — DOCETAXEL CHEMO INJECTION 160 MG/16ML
75.0000 mg/m2 | Freq: Once | INTRAVENOUS | Status: AC
  Administered 2014-12-26: 130 mg via INTRAVENOUS
  Filled 2014-12-26: qty 13

## 2014-12-26 MED ORDER — DIPHENHYDRAMINE HCL 25 MG PO CAPS
25.0000 mg | ORAL_CAPSULE | Freq: Once | ORAL | Status: AC
  Administered 2014-12-26: 25 mg via ORAL

## 2014-12-26 MED ORDER — TRASTUZUMAB CHEMO INJECTION 440 MG
6.0000 mg/kg | Freq: Once | INTRAVENOUS | Status: AC
  Administered 2014-12-26: 357 mg via INTRAVENOUS
  Filled 2014-12-26: qty 17

## 2014-12-26 MED ORDER — ACETAMINOPHEN 325 MG PO TABS
ORAL_TABLET | ORAL | Status: AC
  Filled 2014-12-26: qty 2

## 2014-12-26 MED ORDER — CARBOPLATIN CHEMO INJECTION 450 MG/45ML
389.0000 mg | Freq: Once | INTRAVENOUS | Status: AC
  Administered 2014-12-26: 390 mg via INTRAVENOUS
  Filled 2014-12-26: qty 39

## 2014-12-26 MED ORDER — SODIUM CHLORIDE 0.9 % IV SOLN
420.0000 mg | Freq: Once | INTRAVENOUS | Status: AC
  Administered 2014-12-26: 420 mg via INTRAVENOUS
  Filled 2014-12-26: qty 14

## 2014-12-26 MED ORDER — DIPHENHYDRAMINE HCL 25 MG PO CAPS
ORAL_CAPSULE | ORAL | Status: AC
  Filled 2014-12-26: qty 1

## 2014-12-26 MED ORDER — ACETAMINOPHEN 325 MG PO TABS
650.0000 mg | ORAL_TABLET | Freq: Once | ORAL | Status: AC
  Administered 2014-12-26: 650 mg via ORAL

## 2014-12-26 MED ORDER — SODIUM CHLORIDE 0.9 % IV SOLN
Freq: Once | INTRAVENOUS | Status: AC
  Administered 2014-12-26: 10:00:00 via INTRAVENOUS

## 2014-12-26 MED ORDER — SODIUM CHLORIDE 0.9 % IJ SOLN
10.0000 mL | INTRAMUSCULAR | Status: DC | PRN
  Administered 2014-12-26: 10 mL
  Filled 2014-12-26: qty 10

## 2014-12-26 NOTE — Patient Instructions (Signed)
[facility] Discharge Instructions for Patients Receiving Chemotherapy  Today you received the following chemotherapy agents Herceptin/Perjeta/Taxotere/Carboplatin  To help prevent nausea and vomiting after your treatment, we encourage you to take your nausea medication as prescribed.  If you develop nausea and vomiting that is not controlled by your nausea medication, call the clinic.   BELOW ARE SYMPTOMS THAT SHOULD BE REPORTED IMMEDIATELY:  *FEVER GREATER THAN 100.5 F  *CHILLS WITH OR WITHOUT FEVER  NAUSEA AND VOMITING THAT IS NOT CONTROLLED WITH YOUR NAUSEA MEDICATION  *UNUSUAL SHORTNESS OF BREATH  *UNUSUAL BRUISING OR BLEEDING  TENDERNESS IN MOUTH AND THROAT WITH OR WITHOUT PRESENCE OF ULCERS  *URINARY PROBLEMS  *BOWEL PROBLEMS  UNUSUAL RASH Items with * indicate a potential emergency and should be followed up as soon as possible.  Feel free to call the clinic you have any questions or concerns. The clinic phone number is [phone].  Please show the CHEMO ALERT CARD at check-in to the Emergency Department and triage nurse.   

## 2014-12-26 NOTE — Progress Notes (Signed)
[name]  [phone] [phone]     ID: Diana Dunn DOB: 05-07-1946  MR#: [id number]  HVF#:[id number]  Patient Care Team: Harle Battiest, MD as PCP - General (Obstetrics and Gynecology) Autumn Messing III, MD as Consulting Physician (General Surgery) Chauncey Cruel, MD as Consulting Physician (Oncology) Eppie Gibson, MD as Attending Physician (Radiation Oncology) Mauro Kaufmann, RN as Registered Nurse Rockwell Germany, RN as Registered Nurse Holley Bouche, NP as Nurse Practitioner (Nurse Practitioner) Lona Kettle, MD (Family Medicine) PCP: Harle Battiest, MD OTHER MD:  CHIEF COMPLAINT: Triple positive breast cancer  CURRENT TREATMENT: Chemotherapy and anti-HER-2 immunotherapy  BREAST CANCER HISTORY: From the original intake note:  Diana Dunn had not had a mammogram for approximately 4 years. Sometime mid 2015 she had an area of redness and tenderness in the right breast and she brought this to her physician's attention. She was treated with antibiotics and this completely cleared.Marland Kitchen  Approximately mid April, she again developed redness over the right breast. This was very focal, and it did not look "as red" as the previous time. She brought it to Dr. Harrington Challenger is attention, and was treated with antibiotics. Bilateral mammography with tomosynthesis and right breast ultrasonography was also obtained on 09/19/2014. There was a focal opacity with irregular margins in the upper outer right breast with some central calcifications. There was mild architectural distortion associated with this. On exam Dr. [name] noted a firm palpable tender mass lateral to the right areola with overlying erythema. On ultrasonography there was an irregular hypoechoic mass measuring 2.4 cm.  Repeat right breast ultrasonography after the patient completed her antibiotic course was performed 10/04/2014. On exam there was still a palpable lobulated mass lateral to the right nipple and  by ultrasound this measured 2.6 cm. It was felt to be a possible abscess and a second course of antibiotics, now with Septra, was undertaken. After that treatment, repeat ultrasonography 10/16/2014 continued to show the palpable mass and ultrasonography now found the mass to measure 2.9 cm maximally.  Accordingly biopsy of the mass in question was obtained that same day, 10/16/2014, and showed ([id number]) and invasive ductal carcinoma, grade 2 or 3, estrogen receptor 90% positive, progesterone receptor 80% positive, both with strong staining intensity, with an MIB-1 of 20%, and with HER-2 amplification, the signals ratio being 3.60 and the number per cell 4.50.  Bilateral breast MRI 10/23/2014 showed a breast density to be category B. In the right breast there was an irregular enhancing mass at the 9:30 o'clock position measuring 3.1 cm. There were no additional worrisome masses in the right breast, no findings of concern in the left breast, and no abnormal appearing lymph nodes.  The patient's subsequent history is as detailed below  INTERVAL HISTORY: Shelagh returns today for follow-up of her HER-2 positive breast cancer. Today is day 1 cycle 3 of 6 planned cycles of carboplatin, docetaxel, trastuzumab and pertuzumab, with neulasta for granulocyte support.   REVIEW OF SYSTEMS: Davie is recovering from some sinus symptoms. So far she has had no fevers or chills. Her energy level has improved since her last visit. Her thrush is gone. She denies nausea, vomiting, or changesin bowel or bladder habits. She has no rashes or neuropathy symptoms. A detailed review of systems is otherwise stable.  PAST MEDICAL HISTORY: Past Medical History  Diagnosis Date  . Breast cancer of upper-outer quadrant of right female breast 10/19/2014  . Breast cancer   . History of hiatal hernia   .  IBS (irritable bowel syndrome)   . Lactose intolerance   . Cancer     RIGHT BREAST CANCER  Irritable bowel  symptoms  PAST SURGICAL HISTORY: Past Surgical History  Procedure Laterality Date  . Breast surgery      BIOPSY OF BREAST  . Colonoscopy    . Portacath placement Left 11/02/2014    Procedure: INSERTION PORT-A-CATH;  Surgeon: Autumn Messing III, MD;  Location: [facility];  Service: General;  Laterality: Left;    FAMILY HISTORY No family history on file. The patient's father died at the age of 60 from a myocardial infarction. The patient's mother died at the age of 63 following a stroke. The patient had 2 brothers. One had Down's syndrome. She had no sisters. The only breast cancer in the family was the patient's father's only sister was diagnosed with breast cancer in her 66s. There is no history of ovarian cancer in the family to the patient's knowledge   GYNECOLOGIC HISTORY:  No LMP recorded. Patient is postmenopausal.  menarche age 69, first live birth age 69, she is [place] P2. She went through the change of life in late 1999. She did not take hormone replacement. She took birth control pills for less than 2 years in her 27X, with no complications   SOCIAL HISTORY:  Nyssa is my neighbor. 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 Bill died from widely metastatic malignant melanoma within 4 months of diagnosis some years ago. The patient lives with her [name] and his wife, who is expecting their first child late November 2016. The other son, Diana Dunn, lives in [place]. Both sons are appraised her's. The patient has 2 grandchildren and as just noted one on the way. The patient attends the [employer]: Not in place. At the 10/24/2014 visit the patient was given the appropriate documents 2 complete and notarize at her discretion   HEALTH MAINTENANCE: History  Substance Use Topics  . Smoking status: Former Smoker    Types: Cigarettes    Quit date: 08/03/2014  . Smokeless tobacco: Not on file  . Alcohol Use: 3.6 oz/week     6 Glasses of wine per week     Colonoscopy: 2000?  PAP:  Bone density: On wrist, remote   Lipid panel:  Allergies  Allergen Reactions  . Aspirin     Stomach pain  . Aspirin Nausea Only    GI upset/pain    Current Outpatient Prescriptions  Medication Sig Dispense Refill  . dexamethasone (DECADRON) 4 MG tablet Take two tablets (8 mg) twice a day w food the day before chemo, then the day after chemo and the day after that; last dose the morning of the 4th day counting from chemo as day 1 40 tablet 1  . lidocaine-prilocaine (EMLA) cream Apply 1 application topically as needed. 30 g 0  . ondansetron (ZOFRAN) 8 MG tablet Take one tablet twice a day starting the evening of chemo and continuing through the evening of day 3 40 tablet 1  . prochlorperazine (COMPAZINE) 10 MG tablet Take 1 tablet (10 mg total) by mouth every 6 (six) hours as needed for nausea or vomiting. 30 tablet 0  . fluconazole (DIFLUCAN) 100 MG tablet Take one tablet daily starting day four from chemo and continuing for four days or until thrush resolves (Patient not taking: Reported on 12/26/2014) 30 tablet 1  . hyoscyamine (ANASPAZ) 0.125 MG TBDP  disintergrating tablet Place 0.125 mg under the tongue every 6 (six) hours as needed for cramping.    . hyoscyamine (LEVSIN, ANASPAZ) 0.125 MG tablet Take 0.125 mg by mouth every 4 (four) hours as needed.    Marland Kitchen LORazepam (ATIVAN) 0.5 MG tablet Take 1 tablet (0.5 mg total) by mouth at bedtime. (Patient not taking: Reported on 12/06/2014) 30 tablet 0  . tobramycin-dexamethasone (TOBRADEX) ophthalmic solution Place 1 drop into both eyes every 4 (four) hours while awake. (Patient not taking: Reported on 12/06/2014) 5 mL 0   No current facility-administered medications for this visit.    OBJECTIVE: middle-aged white woman in no acute distress Filed Vitals:   12/26/14 0845  BP: 157/74  Pulse: 85  Temp: 98 F (36.7 C)  Resp: 18     Body mass index is 19.53 kg/(m^2).    ECOG FS:1 -  Symptomatic but completely ambulatory  Skin: warm, dry  HEENT: sclerae anicteric, conjunctivae pink, oropharynx clear. No thrush or mucositis.  Lymph Nodes: No cervical or supraclavicular lymphadenopathy  Lungs: clear to auscultation bilaterally, no rales, wheezes, or rhonci  Heart: regular rate and rhythm  Abdomen: round, soft, non tender, positive bowel sounds  Musculoskeletal: No focal spinal tenderness, no peripheral edema  Neuro: non focal, well oriented, positive affect  Breasts: deferred  LAB RESULTS:  CMP     Component Value Date/Time   NA 139 12/26/2014 0816   NA 140 11/01/2014 1023   K 4.0 12/26/2014 0816   K 4.2 11/01/2014 1023   CL 104 11/01/2014 1023   CO2 23 12/26/2014 0816   CO2 26 11/01/2014 1023   GLUCOSE 190* 12/26/2014 0816   GLUCOSE 93 11/01/2014 1023   BUN 12.8 12/26/2014 0816   BUN 10 11/01/2014 1023   CREATININE 0.9 12/26/2014 0816   CREATININE 0.85 11/01/2014 1023   CALCIUM 10.6* 12/26/2014 0816   CALCIUM 9.9 11/01/2014 1023   PROT 6.6 12/26/2014 0816   ALBUMIN 4.1 12/26/2014 0816   AST 14 12/26/2014 0816   ALT 22 12/26/2014 0816   ALKPHOS 83 12/26/2014 0816   BILITOT 0.51 12/26/2014 0816   GFRNONAA >60 11/01/2014 1023   GFRAA >60 11/01/2014 1023    INo results found for: SPEP, UPEP  Lab Results  Component Value Date   WBC 5.2 12/26/2014   NEUTROABS 4.1 12/26/2014   HGB 13.4 12/26/2014   HCT 38.9 12/26/2014   MCV 94.7 12/26/2014   PLT 282 12/26/2014      Chemistry      Component Value Date/Time   NA 139 12/26/2014 0816   NA 140 11/01/2014 1023   K 4.0 12/26/2014 0816   K 4.2 11/01/2014 1023   CL 104 11/01/2014 1023   CO2 23 12/26/2014 0816   CO2 26 11/01/2014 1023   BUN 12.8 12/26/2014 0816   BUN 10 11/01/2014 1023   CREATININE 0.9 12/26/2014 0816   CREATININE 0.85 11/01/2014 1023      Component Value Date/Time   CALCIUM 10.6* 12/26/2014 0816   CALCIUM 9.9 11/01/2014 1023   ALKPHOS 83 12/26/2014 0816   AST 14 12/26/2014  0816   ALT 22 12/26/2014 0816   BILITOT 0.51 12/26/2014 0816       No results found for: LABCA2  No components found for: LABCA125  No results for input(s): INR in the last 168 hours.  Urinalysis    Component Value Date/Time   LABSPEC 1.020 12/21/2014 1405   PHURINE 5.0 12/21/2014 1405   GLUCOSEU Negative 12/21/2014 1405  HGBUR Negative 12/21/2014 1405   BILIRUBINUR Negative 12/21/2014 1405   KETONESUR Negative 12/21/2014 1405   PROTEINUR < 30 12/21/2014 1405   UROBILINOGEN 0.2 12/21/2014 1405   NITRITE Negative 12/21/2014 1405   LEUKOCYTESUR Small 12/21/2014 1405    STUDIES: No results found.  ASSESSMENT: 69 y.o. [place] woman status post right breast biopsy 10/16/2014 for a clinical T3 N0, stage IIA invasive ductal carcinoma, grade 2 or 3, estrogen and progesterone receptor positive, with an MIB-1 of 20%, and HER-2 amplified, with a signals ratio of 3.60  (1) chemotherapy with anti-HER-2 immunotherapy to start 11/05/2014. This will consist of carboplatin, docetaxel, trastuzumab and pertuzumab given every 21 days 6, with Neulasta support  (2) trastuzumab will be continued to complete 1 year  (a) echocardiogram 10/31/2014 shows an ejection fraction of 55-60%.  (3) definitive surgery to follow chemotherapy  (4) adjuvant radiation to follow surgery as appropriate  (5) anti-estrogens to follow completion of local treatments.  PLAN: Lakrisha looks and feels well today. The labs were reviewed in detail and were entirely stable. She will proceed with cycle 3 of carboplatin, docetaxel, trastuzumab, and pertuzumab as planned today.   She is going to start taking her fluconazole on day 3 instead of day 4 to hopefully keep an edge on her thrush symptoms.  Zahira will return in 1 week for labs and a nadir visit. She understands and agrees with this plan. She knows the goal of treatment in her case is cure. She has been encouraged to call with any issues that might arise  before her next visit here.  Laurie Panda, NP   12/26/2014 9:13 AM

## 2014-12-26 NOTE — Telephone Encounter (Signed)
Gave appointment in treatment area.

## 2014-12-27 ENCOUNTER — Ambulatory Visit: Payer: Self-pay

## 2014-12-27 ENCOUNTER — Other Ambulatory Visit: Payer: Self-pay

## 2014-12-28 ENCOUNTER — Ambulatory Visit (HOSPITAL_BASED_OUTPATIENT_CLINIC_OR_DEPARTMENT_OTHER): Payer: Medicare Other

## 2014-12-28 VITALS — BP 137/71 | HR 69 | Temp 98.2°F

## 2014-12-28 DIAGNOSIS — C50411 Malignant neoplasm of upper-outer quadrant of right female breast: Secondary | ICD-10-CM

## 2014-12-28 DIAGNOSIS — Z5189 Encounter for other specified aftercare: Secondary | ICD-10-CM | POA: Diagnosis not present

## 2014-12-28 DIAGNOSIS — C50811 Malignant neoplasm of overlapping sites of right female breast: Secondary | ICD-10-CM | POA: Diagnosis present

## 2014-12-28 MED ORDER — PEGFILGRASTIM INJECTION 6 MG/0.6ML ~~LOC~~
6.0000 mg | PREFILLED_SYRINGE | Freq: Once | SUBCUTANEOUS | Status: AC
  Administered 2014-12-28: 6 mg via SUBCUTANEOUS
  Filled 2014-12-28: qty 0.6

## 2014-12-29 ENCOUNTER — Ambulatory Visit: Payer: Self-pay

## 2014-12-31 ENCOUNTER — Telehealth: Payer: Self-pay | Admitting: Oncology

## 2014-12-31 ENCOUNTER — Telehealth: Payer: Self-pay | Admitting: *Deleted

## 2014-12-31 ENCOUNTER — Other Ambulatory Visit: Payer: Self-pay | Admitting: *Deleted

## 2014-12-31 DIAGNOSIS — C50411 Malignant neoplasm of upper-outer quadrant of right female breast: Secondary | ICD-10-CM

## 2014-12-31 NOTE — Telephone Encounter (Signed)
Pt called b/c she is concerned that cough and runny nose  has lingered for a week without getting better. Pt denies any fever or chest pain when coughing. Pt says the color of the mucous is still clear mostly. Pt says these symptoms are starting to make her feel "yucky" and just want to make sure she is doing all right. I suggested to pt that I can make her an appt with SM on tomorrow. Pt was in agreement with that. I will call pt to let her the time of the appt. Message to be fwd to [employer].

## 2014-12-31 NOTE — Telephone Encounter (Signed)
Confirmed appointment for 07/19

## 2015-01-01 ENCOUNTER — Other Ambulatory Visit (HOSPITAL_BASED_OUTPATIENT_CLINIC_OR_DEPARTMENT_OTHER): Payer: Medicare Other

## 2015-01-01 ENCOUNTER — Ambulatory Visit (HOSPITAL_BASED_OUTPATIENT_CLINIC_OR_DEPARTMENT_OTHER): Payer: Medicare Other | Admitting: Nurse Practitioner

## 2015-01-01 ENCOUNTER — Ambulatory Visit (HOSPITAL_COMMUNITY)
Admission: RE | Admit: 2015-01-01 | Discharge: 2015-01-01 | Disposition: A | Discharge status: Home / Self Care | Payer: Medicare Other | Source: Ambulatory Visit | Attending: Nurse Practitioner | Admitting: Nurse Practitioner

## 2015-01-01 VITALS — BP 144/75 | HR 89 | Temp 98.1°F | Resp 16 | Wt 118.9 lb

## 2015-01-01 DIAGNOSIS — B3781 Candidal esophagitis: Secondary | ICD-10-CM

## 2015-01-01 DIAGNOSIS — J40 Bronchitis, not specified as acute or chronic: Secondary | ICD-10-CM | POA: Diagnosis not present

## 2015-01-01 DIAGNOSIS — C50811 Malignant neoplasm of overlapping sites of right female breast: Secondary | ICD-10-CM | POA: Diagnosis present

## 2015-01-01 DIAGNOSIS — C50411 Malignant neoplasm of upper-outer quadrant of right female breast: Secondary | ICD-10-CM | POA: Diagnosis not present

## 2015-01-01 DIAGNOSIS — B370 Candidal stomatitis: Secondary | ICD-10-CM | POA: Diagnosis not present

## 2015-01-01 DIAGNOSIS — C50919 Malignant neoplasm of unspecified site of unspecified female breast: Secondary | ICD-10-CM | POA: Diagnosis not present

## 2015-01-01 DIAGNOSIS — R05 Cough: Secondary | ICD-10-CM | POA: Insufficient documentation

## 2015-01-01 LAB — CBC WITH DIFFERENTIAL/PLATELET
BASO%: 0.4 % (ref 0.0–2.0)
Basophils Absolute: 0 10*3/uL (ref 0.0–0.1)
EOS%: 0.5 % (ref 0.0–7.0)
Eosinophils Absolute: 0 10*3/uL (ref 0.0–0.5)
HCT: 39.1 % (ref 34.8–46.6)
HGB: 13.1 g/dL (ref 11.6–15.9)
LYMPH#: 1 10*3/uL (ref 0.9–3.3)
LYMPH%: 40.3 % (ref 14.0–49.7)
MCH: 32.3 pg (ref 25.1–34.0)
MCHC: 33.6 g/dL (ref 31.5–36.0)
MCV: 96 fL (ref 79.5–101.0)
MONO#: 0.3 10*3/uL (ref 0.1–0.9)
MONO%: 10.4 % (ref 0.0–14.0)
NEUT%: 48.4 % (ref 38.4–76.8)
NEUTROS ABS: 1.2 10*3/uL — AB (ref 1.5–6.5)
PLATELETS: 159 10*3/uL (ref 145–400)
RBC: 4.07 10*6/uL (ref 3.70–5.45)
RDW: 14.2 % (ref 11.2–14.5)
WBC: 2.5 10*3/uL — ABNORMAL LOW (ref 3.9–10.3)

## 2015-01-01 LAB — COMPREHENSIVE METABOLIC PANEL (CC13)
ALT: 24 U/L (ref 0–55)
AST: 15 U/L (ref 5–34)
Albumin: 4.1 g/dL (ref 3.5–5.0)
Alkaline Phosphatase: 83 U/L (ref 40–150)
Anion Gap: 9 mEq/L (ref 3–11)
BUN: 10.5 mg/dL (ref 7.0–26.0)
CO2: 27 mEq/L (ref 22–29)
Calcium: 9.9 mg/dL (ref 8.4–10.4)
Chloride: 103 mEq/L (ref 98–109)
Creatinine: 0.8 mg/dL (ref 0.6–1.1)
EGFR: 81 mL/min/{1.73_m2} — AB (ref >90)
GLUCOSE: 139 mg/dL (ref 70–140)
Potassium: 3.6 mEq/L (ref 3.5–5.1)
Sodium: 139 mEq/L (ref 136–145)
TOTAL PROTEIN: 6.4 g/dL (ref 6.4–8.3)
Total Bilirubin: 1.05 mg/dL (ref 0.20–1.20)

## 2015-01-01 MED ORDER — ALBUTEROL SULFATE (2.5 MG/3ML) 0.083% IN NEBU
2.5000 mg | INHALATION_SOLUTION | Freq: Once | RESPIRATORY_TRACT | Status: AC
  Administered 2015-01-01: 2.5 mg via RESPIRATORY_TRACT
  Filled 2015-01-01: qty 3

## 2015-01-01 MED ORDER — ALBUTEROL SULFATE HFA 108 (90 BASE) MCG/ACT IN AERS: 1.0000 | INHALATION_SPRAY | Freq: Four times a day (QID) | RESPIRATORY_TRACT | Status: DC | PRN

## 2015-01-01 MED ORDER — ALBUTEROL SULFATE (2.5 MG/3ML) 0.083% IN NEBU
INHALATION_SOLUTION | RESPIRATORY_TRACT | Status: AC
  Filled 2015-01-01: qty 3

## 2015-01-01 MED ORDER — AZITHROMYCIN 250 MG PO TABS: ORAL_TABLET | ORAL | Status: DC

## 2015-01-02 ENCOUNTER — Encounter: Payer: Self-pay | Admitting: Nurse Practitioner

## 2015-01-02 ENCOUNTER — Telehealth: Payer: Self-pay

## 2015-01-02 DIAGNOSIS — J40 Bronchitis, not specified as acute or chronic: Secondary | ICD-10-CM | POA: Insufficient documentation

## 2015-01-02 DIAGNOSIS — B3781 Candidal esophagitis: Secondary | ICD-10-CM

## 2015-01-02 DIAGNOSIS — B370 Candidal stomatitis: Secondary | ICD-10-CM | POA: Insufficient documentation

## 2015-01-02 NOTE — Telephone Encounter (Signed)
Late entry 01/01/15 415pm   Called and informed pt of negative Xray results. Informed pt to take Z-pak as prescribed and to call with any questions or concerns. Pt verbalized understanding  01/02/15 830am  Called to follow up with pt after 7/19 visit, pt

## 2015-01-02 NOTE — Telephone Encounter (Signed)
Called to follow up with pt, states she feels a little better, hasn't coughed as much today and continues to take her medications as prescribed. Pt verbalizes appt for tomorrow 01/03/15 and knows to call with any other questions or concerns.

## 2015-01-02 NOTE — Assessment & Plan Note (Signed)
Patient has been suffering with some mild, chronic thrush to her mouth recently.  Most likely, it is secondary to her recent chemotherapy.  Patient states that she has been taken Diflucan orally on a daily basis to treatment of her thrush.  On exam.-It does appear the patient has some mild white coating to her tongue.  No oral lesions noted.  Patient was advised to continue with the Diflucan as prescribed.

## 2015-01-02 NOTE — Assessment & Plan Note (Signed)
Patient is complaining of a congested, occasionally productive cough with clear secretions only.  She denies any specific shortness of breath with exertion.  She also denies any chest pain, chest pressure, or pain with inspiration.  She denies any recent fevers or chills.  Patient states that she typically develops allergy symptoms and bronchitis once every year.  She states that she typically requires both antibiotics and steroids to clear the bronchitis symptoms in the past.  On exam.-Patient with mild rhonchi and wheezing throughout all lung fields.  Patient in no acute respiratory distress.  Chest x-ray obtained today revealed no acute findings.  Patient was given a albuterol nebulizer treatment while in the cancer center; which completely resolved all wheezing.  Patient will be prescribed Zithromax anti-biotics and an albuterol inhaler to use at home.  She was advised to call/return or go directly to the emergency department for any worsening symptoms whatsoever.

## 2015-01-02 NOTE — Progress Notes (Signed)
SYMPTOM MANAGEMENT CLINIC   HPI: Diana Dunn 69 y.o. female diagnosed with breast cancer.  Currently undergoing Taxotere/carboplatin/Herceptin/Perjeta chemotherapy regimen.  Patient is complaining of a congested, occasionally productive cough with clear secretions only.  She denies any specific shortness of breath with exertion.  She also denies any chest pain, chest pressure, or pain with inspiration.  She denies any recent fevers or chills.  Patient states that she typically develops allergy symptoms and bronchitis once every year.  She states that she typically requires both antibiotics and steroids to clear the bronchitis symptoms in the past.  HPI  ROS  Past Medical History  Diagnosis Date  . Breast cancer of upper-outer quadrant of right female breast 10/19/2014  . Breast cancer   . History of hiatal hernia   . IBS (irritable bowel syndrome)   . Lactose intolerance   . Cancer     RIGHT BREAST CANCER    Past Surgical History  Procedure Laterality Date  . Breast surgery      BIOPSY OF BREAST  . Colonoscopy    . Portacath placement Left 11/02/2014    Procedure: INSERTION PORT-A-CATH;  Surgeon: Autumn Messing III, MD;  Location: [facility];  Service: General;  Laterality: Left;    has SINUSITIS, ACUTE; ALLERGIC RHINITIS; Nonspecific (abnormal) findings on radiological and other examination of body structure; CT, CHEST, ABNORMAL; Breast cancer of upper-outer quadrant of right female breast; Bronchitis; and Thrush of mouth and esophagus on her problem list.    is allergic to aspirin and aspirin.    Medication List       This list is accurate as of: 01/01/15 11:59 PM.  Always use your most recent med list.               albuterol 108 (90 BASE) MCG/ACT inhaler  Commonly known as:  PROVENTIL HFA;VENTOLIN HFA  Inhale 1-2 puffs into the lungs every 6 (six) hours as needed for wheezing or shortness of breath.     azithromycin 250 MG tablet  Commonly known as:  ZITHROMAX  Z-PAK  Take 2 tabs (500 mg) PO on day # 1; then take 1 tab (250 mg) PO QD till gone.     dexamethasone 4 MG tablet  Commonly known as:  DECADRON  Take two tablets (8 mg) twice a day w food the day before chemo, then the day after chemo and the day after that; last dose the morning of the 4th day counting from chemo as day 1     fluconazole 100 MG tablet  Commonly known as:  DIFLUCAN  Take one tablet daily starting day four from chemo and continuing for four days or until thrush resolves     hyoscyamine 0.125 MG tablet  Commonly known as:  LEVSIN, ANASPAZ  Take 0.125 mg by mouth every 4 (four) hours as needed.     hyoscyamine 0.125 MG Tbdp disintergrating tablet  Commonly known as:  ANASPAZ  Place 0.125 mg under the tongue every 6 (six) hours as needed for cramping.     lidocaine-prilocaine cream  Commonly known as:  EMLA  Apply 1 application topically as needed.     LORazepam 0.5 MG tablet  Commonly known as:  ATIVAN  Take 1 tablet (0.5 mg total) by mouth at bedtime.     ondansetron 8 MG tablet  Commonly known as:  ZOFRAN  Take one tablet twice a day starting the evening of chemo and continuing through the evening of day 3  prochlorperazine 10 MG tablet  Commonly known as:  COMPAZINE  Take 1 tablet (10 mg total) by mouth every 6 (six) hours as needed for nausea or vomiting.     tobramycin-dexamethasone ophthalmic solution  Commonly known as:  TOBRADEX  Place 1 drop into both eyes every 4 (four) hours while awake.         PHYSICAL EXAMINATION  Oncology Vitals 01/01/2015 12/28/2014 12/26/2014 12/12/2014 12/08/2014 12/06/2014 11/23/2014  Height - - 169 cm 169 cm - 169 cm 169 cm  Weight 53.933 kg - 55.702 kg 56.972 kg - 58.015 kg 58.06 kg  Weight (lbs) 118 lbs 14 oz - 122 lbs 13 oz 125 lbs 10 oz - 127 lbs 14 oz 128 lbs  BMI (kg/m2) - - 19.52 kg/m2 19.97 kg/m2 - 20.33 kg/m2 20.35 kg/m2  Temp 98.1 98.2 98 97.9 97.3 97.6 98  Pulse 89 69 85 67 67 72 74  Resp 16 - 18 98 18 18 18    SpO2 98 - 98 98 - 99 -  BSA (m2) - - 1.62 m2 1.64 m2 - 1.65 m2 1.65 m2   BP Readings from Last 3 Encounters:  01/01/15 144/75  12/28/14 137/71  12/26/14 157/74    Physical Exam  Constitutional: She is oriented to person, place, and time and well-developed, well-nourished, and in no distress.  HENT:  Head: Normocephalic and atraumatic.  Nose: Nose normal.  Mouth/Throat: Oropharynx is clear and moist.  No obvious oral lesions; but fine white coating to tongue noted.  Eyes: Conjunctivae and EOM are normal. Pupils are equal, round, and reactive to light. Right eye exhibits no discharge. Left eye exhibits no discharge. No scleral icterus.  Neck: Normal range of motion. Neck supple. No JVD present. No tracheal deviation present. No thyromegaly present.  Cardiovascular: Normal rate, regular rhythm, normal heart sounds and intact distal pulses.   Pulmonary/Chest: Effort normal. No stridor. No respiratory distress. She has wheezes. She has rales. She exhibits no tenderness.  Patient with bilateral rhonchi and wheezing on initial exam.  After nebulizer treatment-patient with no further wheezing noted.  Abdominal: Soft. Bowel sounds are normal. She exhibits no distension and no mass. There is no tenderness. There is no rebound and no guarding.  Musculoskeletal: Normal range of motion. She exhibits no edema or tenderness.  Lymphadenopathy:    She has no cervical adenopathy.  Neurological: She is alert and oriented to person, place, and time. Gait normal.  Skin: Skin is warm and dry. No rash noted. No erythema. No pallor.  Psychiatric: Affect normal.  Nursing note and vitals reviewed.   LABORATORY DATA:. Appointment on 01/01/2015  Component Date Value Ref Range Status  . WBC 01/01/2015 2.5* 3.9 - 10.3 10e3/uL Final  . NEUT# 01/01/2015 1.2* 1.5 - 6.5 10e3/uL Final  . HGB 01/01/2015 13.1  11.6 - 15.9 g/dL Final  . HCT 01/01/2015 39.1  34.8 - 46.6 % Final  . Platelets 01/01/2015 159  145 -  400 10e3/uL Final  . MCV 01/01/2015 96.0  79.5 - 101.0 fL Final  . MCH 01/01/2015 32.3  25.1 - 34.0 pg Final  . MCHC 01/01/2015 33.6  31.5 - 36.0 g/dL Final  . RBC 01/01/2015 4.07  3.70 - 5.45 10e6/uL Final  . RDW 01/01/2015 14.2  11.2 - 14.5 % Final  . lymph# 01/01/2015 1.0  0.9 - 3.3 10e3/uL Final  . MONO# 01/01/2015 0.3  0.1 - 0.9 10e3/uL Final  . Eosinophils Absolute 01/01/2015 0.0  0.0 - 0.5 10e3/uL Final  .  Basophils Absolute 01/01/2015 0.0  0.0 - 0.1 10e3/uL Final  . NEUT% 01/01/2015 48.4  38.4 - 76.8 % Final  . LYMPH% 01/01/2015 40.3  14.0 - 49.7 % Final  . MONO% 01/01/2015 10.4  0.0 - 14.0 % Final  . EOS% 01/01/2015 0.5  0.0 - 7.0 % Final  . BASO% 01/01/2015 0.4  0.0 - 2.0 % Final  . Sodium 01/01/2015 139  136 - 145 mEq/L Final  . Potassium 01/01/2015 3.6  3.5 - 5.1 mEq/L Final  . Chloride 01/01/2015 103  98 - 109 mEq/L Final  . CO2 01/01/2015 27  22 - 29 mEq/L Final  . Glucose 01/01/2015 139  70 - 140 mg/dl Final  . BUN 01/01/2015 10.5  7.0 - 26.0 mg/dL Final  . Creatinine 01/01/2015 0.8  0.6 - 1.1 mg/dL Final  . Total Bilirubin 01/01/2015 1.05  0.20 - 1.20 mg/dL Final  . Alkaline Phosphatase 01/01/2015 83  40 - 150 U/L Final  . AST 01/01/2015 15  5 - 34 U/L Final  . ALT 01/01/2015 24  0 - 55 U/L Final  . Total Protein 01/01/2015 6.4  6.4 - 8.3 g/dL Final  . Albumin 01/01/2015 4.1  3.5 - 5.0 g/dL Final  . Calcium 01/01/2015 9.9  8.4 - 10.4 mg/dL Final  . Anion Gap 01/01/2015 9  3 - 11 mEq/L Final  . EGFR 01/01/2015 81* >90 ml/min/1.73 m2 Final   eGFR is calculated using the CKD-EPI Creatinine Equation (2009)     RADIOGRAPHIC STUDIES: Dg Chest 2 View  01/01/2015   CLINICAL DATA:  Breast cancer, chemotherapy. Increase cough and congestion.  EXAM: CHEST  2 VIEW  COMPARISON:  11/02/2014  FINDINGS: Left Port-A-Cath tip now flipped up into the right innominate vein, new since prior study. Mild hyperinflation of the lungs. Lungs are clear. No effusions. Heart is normal size. No  acute bony abnormality.  IMPRESSION: Mild hyperinflation.  No active cardiopulmonary disease.  Left Port-A-Cath tip clips of and courses into the right innominate vein, new since prior study.   Electronically Signed   By: Rolm Baptise M.D.   On: 01/01/2015 13:05    ASSESSMENT/PLAN:    Breast cancer of upper-outer quadrant of right female breast Patient received her last cycle of Taxotere/carboplatin/Herceptin/Perjeta chemotherapy on 12/26/2014.  Blood count today.  Refills mild neutropenia with ANC of 1.2.  Patient continues to deny any recent fevers or chills.  Patient is scheduled to return for labs and a follow-up visit on 01/03/2015.  Bronchitis Patient is complaining of a congested, occasionally productive cough with clear secretions only.  She denies any specific shortness of breath with exertion.  She also denies any chest pain, chest pressure, or pain with inspiration.  She denies any recent fevers or chills.  Patient states that she typically develops allergy symptoms and bronchitis once every year.  She states that she typically requires both antibiotics and steroids to clear the bronchitis symptoms in the past.  On exam.-Patient with mild rhonchi and wheezing throughout all lung fields.  Patient in no acute respiratory distress.  Chest x-ray obtained today revealed no acute findings.  Patient was given a albuterol nebulizer treatment while in the cancer center; which completely resolved all wheezing.  Patient will be prescribed Zithromax anti-biotics and an albuterol inhaler to use at home.  She was advised to call/return or go directly to the emergency department for any worsening symptoms whatsoever.  Thrush of mouth and esophagus Patient has been suffering with some mild, chronic  thrush to her mouth recently.  Most likely, it is secondary to her recent chemotherapy.  Patient states that she has been taken Diflucan orally on a daily basis to treatment of her thrush.  On exam.-It  does appear the patient has some mild white coating to her tongue.  No oral lesions noted.  Patient was advised to continue with the Diflucan as prescribed.  Patient stated understanding of all instructions; and was in agreement with this plan of care. The patient knows to call the clinic with any problems, questions or concerns.   Review/collaboration with Dr. Jana Hakim regarding all aspects of patient's visit today.   Total time spent with patient was 40 minutes;  with greater than 75 percent of that time spent in face to face counseling regarding patient's symptoms,  and coordination of care and follow up.  Disclaimer: This note was dictated with voice recognition software. Similar sounding words can inadvertently be transcribed and may not be corrected upon review.   Drue Second, NP 01/02/2015

## 2015-01-02 NOTE — Assessment & Plan Note (Signed)
Patient received her last cycle of Taxotere/carboplatin/Herceptin/Perjeta chemotherapy on 12/26/2014.  Blood count today.  Refills mild neutropenia with ANC of 1.2.  Patient continues to deny any recent fevers or chills.  Patient is scheduled to return for labs and a follow-up visit on 01/03/2015.

## 2015-01-03 ENCOUNTER — Ambulatory Visit (HOSPITAL_BASED_OUTPATIENT_CLINIC_OR_DEPARTMENT_OTHER): Payer: Medicare Other | Admitting: Nurse Practitioner

## 2015-01-03 ENCOUNTER — Other Ambulatory Visit (HOSPITAL_BASED_OUTPATIENT_CLINIC_OR_DEPARTMENT_OTHER): Payer: Medicare Other

## 2015-01-03 ENCOUNTER — Encounter: Payer: Self-pay | Admitting: Nurse Practitioner

## 2015-01-03 ENCOUNTER — Telehealth: Payer: Self-pay | Admitting: Nurse Practitioner

## 2015-01-03 VITALS — BP 136/68 | HR 98 | Temp 98.7°F | Resp 18 | Ht 66.5 in | Wt 118.6 lb

## 2015-01-03 DIAGNOSIS — C50811 Malignant neoplasm of overlapping sites of right female breast: Secondary | ICD-10-CM

## 2015-01-03 DIAGNOSIS — C50411 Malignant neoplasm of upper-outer quadrant of right female breast: Secondary | ICD-10-CM

## 2015-01-03 DIAGNOSIS — Z170 Estrogen receptor positive status [ER+]: Secondary | ICD-10-CM

## 2015-01-03 LAB — COMPREHENSIVE METABOLIC PANEL (CC13)
ALK PHOS: 105 U/L (ref 40–150)
ALT: 26 U/L (ref 0–55)
AST: 19 U/L (ref 5–34)
Albumin: 4.2 g/dL (ref 3.5–5.0)
Anion Gap: 8 mEq/L (ref 3–11)
BUN: 9.7 mg/dL (ref 7.0–26.0)
CO2: 29 mEq/L (ref 22–29)
CREATININE: 0.8 mg/dL (ref 0.6–1.1)
Calcium: 10.1 mg/dL (ref 8.4–10.4)
Chloride: 105 mEq/L (ref 98–109)
EGFR: 75 mL/min/{1.73_m2} — AB (ref >90)
Glucose: 139 mg/dl (ref 70–140)
Potassium: 4.4 mEq/L (ref 3.5–5.1)
SODIUM: 142 meq/L (ref 136–145)
TOTAL PROTEIN: 6.4 g/dL (ref 6.4–8.3)
Total Bilirubin: 0.4 mg/dL (ref 0.20–1.20)

## 2015-01-03 LAB — CBC WITH DIFFERENTIAL/PLATELET
BASO%: 0.3 % (ref 0.0–2.0)
Basophils Absolute: 0.1 10*3/uL (ref 0.0–0.1)
EOS%: 0 % (ref 0.0–7.0)
Eosinophils Absolute: 0 10*3/uL (ref 0.0–0.5)
HCT: 40.1 % (ref 34.8–46.6)
HGB: 13.3 g/dL (ref 11.6–15.9)
LYMPH%: 13.5 % — AB (ref 14.0–49.7)
MCH: 32.1 pg (ref 25.1–34.0)
MCHC: 33.2 g/dL (ref 31.5–36.0)
MCV: 96.7 fL (ref 79.5–101.0)
MONO#: 1.4 10*3/uL — ABNORMAL HIGH (ref 0.1–0.9)
MONO%: 6.8 % (ref 0.0–14.0)
NEUT#: 16 10*3/uL — ABNORMAL HIGH (ref 1.5–6.5)
NEUT%: 79.4 % — ABNORMAL HIGH (ref 38.4–76.8)
Platelets: 217 10*3/uL (ref 145–400)
RBC: 4.15 10*6/uL (ref 3.70–5.45)
RDW: 14.3 % (ref 11.2–14.5)
WBC: 20.2 10*3/uL — AB (ref 3.9–10.3)
lymph#: 2.7 10*3/uL (ref 0.9–3.3)

## 2015-01-03 NOTE — Progress Notes (Signed)
[facility]  [phone] [phone]     ID: LINDIE ROBERSON DOB: 12/04/1945  MR#: [id number]  AYT#:[id number]  Patient Care Team: Harle Battiest, MD as PCP - General (Obstetrics and Gynecology) Autumn Messing III, MD as Consulting Physician (General Surgery) Chauncey Cruel, MD as Consulting Physician (Oncology) Eppie Gibson, MD as Attending Physician (Radiation Oncology) Mauro Kaufmann, RN as Registered Nurse Rockwell Germany, RN as Registered Nurse Holley Bouche, NP as Nurse Practitioner (Nurse Practitioner) Lona Kettle, MD (Family Medicine) PCP: Harle Battiest, MD OTHER MD:  CHIEF COMPLAINT: Triple positive breast cancer  CURRENT TREATMENT: Chemotherapy and anti-HER-2 immunotherapy  BREAST CANCER HISTORY: From the original intake note:  Omer had not had a mammogram for approximately 4 years. Sometime mid 2015 she had an area of redness and tenderness in the right breast and she brought this to her physician's attention. She was treated with antibiotics and this completely cleared.Marland Kitchen  Approximately mid April, she again developed redness over the right breast. This was very focal, and it did not look "as red" as the previous time. She brought it to Dr. Harrington Challenger is attention, and was treated with antibiotics. Bilateral mammography with tomosynthesis and right breast ultrasonography was also obtained on 09/19/2014. There was a focal opacity with irregular margins in the upper outer right breast with some central calcifications. There was mild architectural distortion associated with this. On exam Dr. [name] noted a firm palpable tender mass lateral to the right areola with overlying erythema. On ultrasonography there was an irregular hypoechoic mass measuring 2.4 cm.  Repeat right breast ultrasonography after the patient completed her antibiotic course was performed 10/04/2014. On exam there was still a palpable lobulated mass lateral to the right nipple and  by ultrasound this measured 2.6 cm. It was felt to be a possible abscess and a second course of antibiotics, now with Septra, was undertaken. After that treatment, repeat ultrasonography 10/16/2014 continued to show the palpable mass and ultrasonography now found the mass to measure 2.9 cm maximally.  Accordingly biopsy of the mass in question was obtained that same day, 10/16/2014, and showed ([id number]) and invasive ductal carcinoma, grade 2 or 3, estrogen receptor 90% positive, progesterone receptor 80% positive, both with strong staining intensity, with an MIB-1 of 20%, and with HER-2 amplification, the signals ratio being 3.60 and the number per cell 4.50.  Bilateral breast MRI 10/23/2014 showed a breast density to be category B. In the right breast there was an irregular enhancing mass at the 9:30 o'clock position measuring 3.1 cm. There were no additional worrisome masses in the right breast, no findings of concern in the left breast, and no abnormal appearing lymph nodes.  The patient's subsequent history is as detailed below  INTERVAL HISTORY: Elgene returns today for follow-up of her HER-2 positive breast cancer. Today is day 8 cycle 3 of 6 planned cycles of carboplatin, docetaxel, trastuzumab and pertuzumab, with neulasta for granulocyte support. She visited the symptom management clinic this week for evaluation. She was found to be wheezing, and was given antibiotics and a breathing treatment. She is feeling better today with no wheezing.  REVIEW OF SYSTEMS: Karuna denies fevers, chills, nausea, vomiting, or changes in bowel or bladder habits. Her thrush resolved with fluconazole use. She denies mouth sores, rashes, or neuropathy symptoms. Her appetite is down but she supplements with protein shakes well. She is fatigued this week and emotionally overwhelmed. Her birthday was on Monday, and she has just felt depressed  since then. A detailed review of systems is otherwise stable.  PAST  MEDICAL HISTORY: Past Medical History  Diagnosis Date  . Breast cancer of upper-outer quadrant of right female breast 10/19/2014  . Breast cancer   . History of hiatal hernia   . IBS (irritable bowel syndrome)   . Lactose intolerance   . Cancer     RIGHT BREAST CANCER  Irritable bowel symptoms  PAST SURGICAL HISTORY: Past Surgical History  Procedure Laterality Date  . Breast surgery      BIOPSY OF BREAST  . Colonoscopy    . Portacath placement Left 11/02/2014    Procedure: INSERTION PORT-A-CATH;  Surgeon: Autumn Messing III, MD;  Location: [facility];  Service: General;  Laterality: Left;    FAMILY HISTORY No family history on file. The patient's father died at the age of 67 from a myocardial infarction. The patient's mother died at the age of 67 following a stroke. The patient had 2 brothers. One had Down's syndrome. She had no sisters. The only breast cancer in the family was the patient's father's only sister was diagnosed with breast cancer in her 72s. There is no history of ovarian cancer in the family to the patient's knowledge   GYNECOLOGIC HISTORY:  No LMP recorded. Patient is postmenopausal.  menarche age 4, first live birth age 54, she is [place] P2. She went through the change of life in late 1999. She did not take hormone replacement. She took birth control pills for less than 2 years in her 95K, with no complications   SOCIAL HISTORY:  Mega is my neighbor. 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 Bill died from widely metastatic malignant melanoma within 4 months of diagnosis some years ago. The patient lives with her son [name] and his wife, who is expecting their first child late November 2016. The other son, [name], lives in [place]. Both sons are appraised her's. The patient has 2 grandchildren and as just noted one on the way. The patient attends the [employer]: Not in place. At the 10/24/2014 visit  the patient was given the appropriate documents 2 complete and notarize at her discretion   HEALTH MAINTENANCE: History  Substance Use Topics  . Smoking status: Former Smoker    Types: Cigarettes    Quit date: 08/03/2014  . Smokeless tobacco: Not on file  . Alcohol Use: 3.6 oz/week    6 Glasses of wine per week     Colonoscopy: 2000?  PAP:  Bone density: On wrist, remote   Lipid panel:  Allergies  Allergen Reactions  . Aspirin     Stomach pain  . Aspirin Nausea Only    GI upset/pain    Current Outpatient Prescriptions  Medication Sig Dispense Refill  . albuterol (PROVENTIL HFA;VENTOLIN HFA) 108 (90 BASE) MCG/ACT inhaler Inhale 1-2 puffs into the lungs every 6 (six) hours as needed for wheezing or shortness of breath. 1 Inhaler 2  . azithromycin (ZITHROMAX Z-PAK) 250 MG tablet Take 2 tabs (500 mg) PO on day # 1; then take 1 tab (250 mg) PO QD till gone. 6 each 0  . dexamethasone (DECADRON) 4 MG tablet Take two tablets (8 mg) twice a day w food the day before chemo, then the day after chemo and the day after that; last dose the morning of the 4th day counting from chemo as day 1 40 tablet 1  . fluconazole (  DIFLUCAN) 100 MG tablet Take one tablet daily starting day four from chemo and continuing for four days or until thrush resolves 30 tablet 1  . lidocaine-prilocaine (EMLA) cream Apply 1 application topically as needed. 30 g 0  . ondansetron (ZOFRAN) 8 MG tablet Take one tablet twice a day starting the evening of chemo and continuing through the evening of day 3 40 tablet 1  . hyoscyamine (ANASPAZ) 0.125 MG TBDP disintergrating tablet Place 0.125 mg under the tongue every 6 (six) hours as needed for cramping.    . hyoscyamine (LEVSIN, ANASPAZ) 0.125 MG tablet Take 0.125 mg by mouth every 4 (four) hours as needed.    Marland Kitchen LORazepam (ATIVAN) 0.5 MG tablet Take 1 tablet (0.5 mg total) by mouth at bedtime. (Patient not taking: Reported on 12/06/2014) 30 tablet 0  . prochlorperazine  (COMPAZINE) 10 MG tablet Take 1 tablet (10 mg total) by mouth every 6 (six) hours as needed for nausea or vomiting. (Patient not taking: Reported on 01/03/2015) 30 tablet 0  . tobramycin-dexamethasone (TOBRADEX) ophthalmic solution Place 1 drop into both eyes every 4 (four) hours while awake. (Patient not taking: Reported on 12/06/2014) 5 mL 0   No current facility-administered medications for this visit.    OBJECTIVE: middle-aged white woman in no acute distress Filed Vitals:   01/03/15 1513  BP: 136/68  Pulse: 98  Temp: 98.7 F (37.1 C)  Resp: 18     Body mass index is 18.86 kg/(m^2).    ECOG FS:1 - Symptomatic but completely ambulatory  Sclerae unicteric, pupils round and equal Oropharynx clear and moist-- no thrush or other lesions No cervical or supraclavicular adenopathy Lungs no rales or rhonchi Heart regular rate and rhythm Abd soft, nontender, positive bowel sounds MSK no focal spinal tenderness, no upper extremity lymphedema Neuro: nonfocal, well oriented, appropriate affect Breasts: deferred  LAB RESULTS:  CMP     Component Value Date/Time   NA 142 01/03/2015 1459   NA 140 11/01/2014 1023   K 4.4 01/03/2015 1459   K 4.2 11/01/2014 1023   CL 104 11/01/2014 1023   CO2 29 01/03/2015 1459   CO2 26 11/01/2014 1023   GLUCOSE 139 01/03/2015 1459   GLUCOSE 93 11/01/2014 1023   BUN 9.7 01/03/2015 1459   BUN 10 11/01/2014 1023   CREATININE 0.8 01/03/2015 1459   CREATININE 0.85 11/01/2014 1023   CALCIUM 10.1 01/03/2015 1459   CALCIUM 9.9 11/01/2014 1023   PROT 6.4 01/03/2015 1459   ALBUMIN 4.2 01/03/2015 1459   AST 19 01/03/2015 1459   ALT 26 01/03/2015 1459   ALKPHOS 105 01/03/2015 1459   BILITOT 0.40 01/03/2015 1459   GFRNONAA >60 11/01/2014 1023   GFRAA >60 11/01/2014 1023    INo results found for: SPEP, UPEP  Lab Results  Component Value Date   WBC 20.2* 01/03/2015   NEUTROABS 16.0* 01/03/2015   HGB 13.3 01/03/2015   HCT 40.1 01/03/2015   MCV 96.7  01/03/2015   PLT 217 01/03/2015      Chemistry      Component Value Date/Time   NA 142 01/03/2015 1459   NA 140 11/01/2014 1023   K 4.4 01/03/2015 1459   K 4.2 11/01/2014 1023   CL 104 11/01/2014 1023   CO2 29 01/03/2015 1459   CO2 26 11/01/2014 1023   BUN 9.7 01/03/2015 1459   BUN 10 11/01/2014 1023   CREATININE 0.8 01/03/2015 1459   CREATININE 0.85 11/01/2014 1023      Component  Value Date/Time   CALCIUM 10.1 01/03/2015 1459   CALCIUM 9.9 11/01/2014 1023   ALKPHOS 105 01/03/2015 1459   AST 19 01/03/2015 1459   ALT 26 01/03/2015 1459   BILITOT 0.40 01/03/2015 1459       No results found for: LABCA2  No components found for: LABCA125  No results for input(s): INR in the last 168 hours.  Urinalysis    Component Value Date/Time   LABSPEC 1.020 12/21/2014 1405   PHURINE 5.0 12/21/2014 1405   GLUCOSEU Negative 12/21/2014 1405   HGBUR Negative 12/21/2014 1405   BILIRUBINUR Negative 12/21/2014 1405   KETONESUR Negative 12/21/2014 1405   PROTEINUR < 30 12/21/2014 1405   UROBILINOGEN 0.2 12/21/2014 1405   NITRITE Negative 12/21/2014 1405   LEUKOCYTESUR Small 12/21/2014 1405    STUDIES: Dg Chest 2 View  01/01/2015   CLINICAL DATA:  Breast cancer, chemotherapy. Increase cough and congestion.  EXAM: CHEST  2 VIEW  COMPARISON:  11/02/2014  FINDINGS: Left Port-A-Cath tip now flipped up into the right innominate vein, new since prior study. Mild hyperinflation of the lungs. Lungs are clear. No effusions. Heart is normal size. No acute bony abnormality.  IMPRESSION: Mild hyperinflation.  No active cardiopulmonary disease.  Left Port-A-Cath tip clips of and courses into the right innominate vein, new since prior study.   Electronically Signed   By: Rolm Baptise M.D.   On: 01/01/2015 13:05    ASSESSMENT: 69 y.o. [place] woman status post right breast biopsy 10/16/2014 for a clinical T3 N0, stage IIA invasive ductal carcinoma, grade 2 or 3, estrogen and progesterone receptor  positive, with an MIB-1 of 20%, and HER-2 amplified, with a signals ratio of 3.60  (1) chemotherapy with anti-HER-2 immunotherapy to start 11/05/2014. This will consist of carboplatin, docetaxel, trastuzumab and pertuzumab given every 21 days 6, with Neulasta support  (2) trastuzumab will be continued to complete 1 year  (a) echocardiogram 10/31/2014 shows an ejection fraction of 55-60%.  (3) definitive surgery to follow chemotherapy  (4) adjuvant radiation to follow surgery as appropriate  (5) anti-estrogens to follow completion of local treatments.  PLAN: Alizay felt better after a pep talk. Physically she is tolerating the chemo well, but she had a bad week mentally. She feels encouraged today. The labs were reviewed in detail and were entirely stable.   Conley will return in 2 weeks for cycle 4 of TCHP . She understands and agrees with this plan. She knows the goal of treatment in her case is cure. She has been encouraged to call with any issues that might arise before her next visit here.  Laurie Panda, NP   01/03/2015 4:23 PM

## 2015-01-03 NOTE — Telephone Encounter (Signed)
Gave avs & calendar for August/September °

## 2015-01-07 DIAGNOSIS — C50411 Malignant neoplasm of upper-outer quadrant of right female breast: Secondary | ICD-10-CM | POA: Diagnosis not present

## 2015-01-16 ENCOUNTER — Encounter: Payer: Self-pay | Admitting: Nurse Practitioner

## 2015-01-16 ENCOUNTER — Ambulatory Visit (HOSPITAL_BASED_OUTPATIENT_CLINIC_OR_DEPARTMENT_OTHER): Payer: Medicare Other | Admitting: Nurse Practitioner

## 2015-01-16 ENCOUNTER — Other Ambulatory Visit: Payer: Self-pay | Admitting: *Deleted

## 2015-01-16 ENCOUNTER — Other Ambulatory Visit (HOSPITAL_BASED_OUTPATIENT_CLINIC_OR_DEPARTMENT_OTHER): Payer: Medicare Other

## 2015-01-16 ENCOUNTER — Telehealth: Payer: Self-pay | Admitting: Nurse Practitioner

## 2015-01-16 ENCOUNTER — Ambulatory Visit (HOSPITAL_BASED_OUTPATIENT_CLINIC_OR_DEPARTMENT_OTHER): Payer: Medicare Other

## 2015-01-16 VITALS — BP 130/66 | HR 70 | Temp 98.0°F | Resp 18 | Ht 66.5 in | Wt 120.5 lb

## 2015-01-16 DIAGNOSIS — Z5112 Encounter for antineoplastic immunotherapy: Secondary | ICD-10-CM | POA: Diagnosis not present

## 2015-01-16 DIAGNOSIS — Z5111 Encounter for antineoplastic chemotherapy: Secondary | ICD-10-CM

## 2015-01-16 DIAGNOSIS — Z170 Estrogen receptor positive status [ER+]: Secondary | ICD-10-CM

## 2015-01-16 DIAGNOSIS — C50811 Malignant neoplasm of overlapping sites of right female breast: Secondary | ICD-10-CM

## 2015-01-16 DIAGNOSIS — C50411 Malignant neoplasm of upper-outer quadrant of right female breast: Secondary | ICD-10-CM

## 2015-01-16 DIAGNOSIS — Z5181 Encounter for therapeutic drug level monitoring: Secondary | ICD-10-CM

## 2015-01-16 DIAGNOSIS — Z79899 Other long term (current) drug therapy: Secondary | ICD-10-CM

## 2015-01-16 LAB — CBC WITH DIFFERENTIAL/PLATELET
BASO%: 0.1 % (ref 0.0–2.0)
Basophils Absolute: 0 10*3/uL (ref 0.0–0.1)
EOS ABS: 0 10*3/uL (ref 0.0–0.5)
EOS%: 0 % (ref 0.0–7.0)
HCT: 37.2 % (ref 34.8–46.6)
HGB: 12.6 g/dL (ref 11.6–15.9)
LYMPH%: 14.8 % (ref 14.0–49.7)
MCH: 32.9 pg (ref 25.1–34.0)
MCHC: 33.8 g/dL (ref 31.5–36.0)
MCV: 97.2 fL (ref 79.5–101.0)
MONO#: 0.4 10*3/uL (ref 0.1–0.9)
MONO%: 6.3 % (ref 0.0–14.0)
NEUT#: 4.9 10*3/uL (ref 1.5–6.5)
NEUT%: 78.8 % — ABNORMAL HIGH (ref 38.4–76.8)
Platelets: 263 10*3/uL (ref 145–400)
RBC: 3.83 10*6/uL (ref 3.70–5.45)
RDW: 15 % — ABNORMAL HIGH (ref 11.2–14.5)
WBC: 6.2 10*3/uL (ref 3.9–10.3)
lymph#: 0.9 10*3/uL (ref 0.9–3.3)

## 2015-01-16 LAB — COMPREHENSIVE METABOLIC PANEL (CC13)
ALT: 20 U/L (ref 0–55)
AST: 14 U/L (ref 5–34)
Albumin: 4.2 g/dL (ref 3.5–5.0)
Alkaline Phosphatase: 80 U/L (ref 40–150)
Anion Gap: 8 mEq/L (ref 3–11)
BUN: 12.3 mg/dL (ref 7.0–26.0)
CHLORIDE: 106 meq/L (ref 98–109)
CO2: 25 mEq/L (ref 22–29)
Calcium: 10.4 mg/dL (ref 8.4–10.4)
Creatinine: 0.8 mg/dL (ref 0.6–1.1)
EGFR: 73 mL/min/{1.73_m2} — ABNORMAL LOW (ref >90)
GLUCOSE: 169 mg/dL — AB (ref 70–140)
Potassium: 3.7 mEq/L (ref 3.5–5.1)
Sodium: 139 mEq/L (ref 136–145)
TOTAL PROTEIN: 6.6 g/dL (ref 6.4–8.3)
Total Bilirubin: 0.49 mg/dL (ref 0.20–1.20)

## 2015-01-16 MED ORDER — SODIUM CHLORIDE 0.9 % IV SOLN
Freq: Once | INTRAVENOUS | Status: AC
  Administered 2015-01-16: 10:00:00 via INTRAVENOUS
  Filled 2015-01-16: qty 8

## 2015-01-16 MED ORDER — ACETAMINOPHEN 325 MG PO TABS
650.0000 mg | ORAL_TABLET | Freq: Once | ORAL | Status: AC
  Administered 2015-01-16: 650 mg via ORAL

## 2015-01-16 MED ORDER — CARBOPLATIN CHEMO INJECTION 450 MG/45ML
385.5000 mg | Freq: Once | INTRAVENOUS | Status: AC
  Administered 2015-01-16: 390 mg via INTRAVENOUS
  Filled 2015-01-16: qty 39

## 2015-01-16 MED ORDER — HYOSCYAMINE SULFATE 0.125 MG PO TBDP: 0.1250 mg | ORAL_TABLET | Freq: Four times a day (QID) | ORAL | Status: DC | PRN

## 2015-01-16 MED ORDER — DIPHENHYDRAMINE HCL 25 MG PO CAPS
ORAL_CAPSULE | ORAL | Status: AC
  Filled 2015-01-16: qty 1

## 2015-01-16 MED ORDER — SODIUM CHLORIDE 0.9 % IV SOLN
420.0000 mg | Freq: Once | INTRAVENOUS | Status: AC
  Administered 2015-01-16: 420 mg via INTRAVENOUS
  Filled 2015-01-16: qty 14

## 2015-01-16 MED ORDER — HEPARIN SOD (PORK) LOCK FLUSH 100 UNIT/ML IV SOLN
500.0000 [IU] | Freq: Once | INTRAVENOUS | Status: AC | PRN
  Administered 2015-01-16: 500 [IU]
  Filled 2015-01-16: qty 5

## 2015-01-16 MED ORDER — TRASTUZUMAB CHEMO INJECTION 440 MG
6.0000 mg/kg | Freq: Once | INTRAVENOUS | Status: AC
  Administered 2015-01-16: 357 mg via INTRAVENOUS
  Filled 2015-01-16: qty 17

## 2015-01-16 MED ORDER — DOCETAXEL CHEMO INJECTION 160 MG/16ML
75.0000 mg/m2 | Freq: Once | INTRAVENOUS | Status: AC
  Administered 2015-01-16: 130 mg via INTRAVENOUS
  Filled 2015-01-16: qty 13

## 2015-01-16 MED ORDER — DIPHENHYDRAMINE HCL 25 MG PO CAPS
25.0000 mg | ORAL_CAPSULE | Freq: Once | ORAL | Status: AC
  Administered 2015-01-16: 25 mg via ORAL

## 2015-01-16 MED ORDER — ACETAMINOPHEN 325 MG PO TABS
ORAL_TABLET | ORAL | Status: AC
  Filled 2015-01-16: qty 2

## 2015-01-16 MED ORDER — SODIUM CHLORIDE 0.9 % IJ SOLN
10.0000 mL | INTRAMUSCULAR | Status: DC | PRN
  Administered 2015-01-16: 10 mL
  Filled 2015-01-16: qty 10

## 2015-01-16 MED ORDER — SODIUM CHLORIDE 0.9 % IV SOLN
Freq: Once | INTRAVENOUS | Status: AC
  Administered 2015-01-16: 10:00:00 via INTRAVENOUS

## 2015-01-16 NOTE — Progress Notes (Signed)
[facility]  [phone] [phone]     ID: TRAM WRENN DOB: 1946-01-04  MR#: [id number]  WPY#:[id number]  Patient Care Team: Harle Battiest, MD as PCP - General (Obstetrics and Gynecology) Autumn Messing III, MD as Consulting Physician (General Surgery) Chauncey Cruel, MD as Consulting Physician (Oncology) Eppie Gibson, MD as Attending Physician (Radiation Oncology) Mauro Kaufmann, RN as Registered Nurse Rockwell Germany, RN as Registered Nurse Holley Bouche, NP as Nurse Practitioner (Nurse Practitioner) Lona Kettle, MD (Family Medicine) PCP: Harle Battiest, MD OTHER MD:  CHIEF COMPLAINT: Triple positive breast cancer  CURRENT TREATMENT: Chemotherapy and anti-HER-2 immunotherapy  BREAST CANCER HISTORY: From the original intake note:  Ineta had not had a mammogram for approximately 4 years. Sometime mid 2015 she had an area of redness and tenderness in the right breast and she brought this to her physician's attention. She was treated with antibiotics and this completely cleared.Marland Kitchen  Approximately mid April, she again developed redness over the right breast. This was very focal, and it did not look "as red" as the previous time. She brought it to Dr. Harrington Challenger is attention, and was treated with antibiotics. Bilateral mammography with tomosynthesis and right breast ultrasonography was also obtained on 09/19/2014. There was a focal opacity with irregular margins in the upper outer right breast with some central calcifications. There was mild architectural distortion associated with this. On exam Dr. [name] noted a firm palpable tender mass lateral to the right areola with overlying erythema. On ultrasonography there was an irregular hypoechoic mass measuring 2.4 cm.  Repeat right breast ultrasonography after the patient completed her antibiotic course was performed 10/04/2014. On exam there was still a palpable lobulated mass lateral to the right nipple and  by ultrasound this measured 2.6 cm. It was felt to be a possible abscess and a second course of antibiotics, now with Septra, was undertaken. After that treatment, repeat ultrasonography 10/16/2014 continued to show the palpable mass and ultrasonography now found the mass to measure 2.9 cm maximally.  Accordingly biopsy of the mass in question was obtained that same day, 10/16/2014, and showed ([id number]) and invasive ductal carcinoma, grade 2 or 3, estrogen receptor 90% positive, progesterone receptor 80% positive, both with strong staining intensity, with an MIB-1 of 20%, and with HER-2 amplification, the signals ratio being 3.60 and the number per cell 4.50.  Bilateral breast MRI 10/23/2014 showed a breast density to be category B. In the right breast there was an irregular enhancing mass at the 9:30 o'clock position measuring 3.1 cm. There were no additional worrisome masses in the right breast, no findings of concern in the left breast, and no abnormal appearing lymph nodes.  The patient's subsequent history is as detailed below  INTERVAL HISTORY: Glennice returns today for follow-up of her HER-2 positive breast cancer. Today is day 1 cycle 4 of 6 planned cycles of carboplatin, docetaxel, trastuzumab and pertuzumab, with neulasta for granulocyte support.   REVIEW OF SYSTEMS: Monalisa denies fevers, chills, or changes in bowel or bladder habits. She had some mild queeziness managed with compazine. Her appetite has improved over the last 4 days. Her congestion has improved and now only bothers her first thing in the morning. She denies mouth sores or rashes. She has no numbness or tingling but the very tips of her fingers are sensitive, especially when flossing. A detailed review of systems is otherwise stable.  PAST MEDICAL HISTORY: Past Medical History  Diagnosis Date  . Breast cancer  of upper-outer quadrant of right female breast 10/19/2014  . Breast cancer   . History of hiatal hernia   .  IBS (irritable bowel syndrome)   . Lactose intolerance   . Cancer     RIGHT BREAST CANCER  Irritable bowel symptoms  PAST SURGICAL HISTORY: Past Surgical History  Procedure Laterality Date  . Breast surgery      BIOPSY OF BREAST  . Colonoscopy    . Portacath placement Left 11/02/2014    Procedure: INSERTION PORT-A-CATH;  Surgeon: Autumn Messing III, MD;  Location: [facility];  Service: General;  Laterality: Left;    FAMILY HISTORY No family history on file. The patient's father died at the age of 67 from a myocardial infarction. The patient's mother died at the age of 67 following a stroke. The patient had 2 brothers. One had Down's syndrome. She had no sisters. The only breast cancer in the family was the patient's father's only sister was diagnosed with breast cancer in her 67s. There is no history of ovarian cancer in the family to the patient's knowledge   GYNECOLOGIC HISTORY:  No LMP recorded. Patient is postmenopausal.  menarche age 69, first live birth age 39, she is [place] P67. She went through the change of life in late 1999. She did not take hormone replacement. She took birth control pills for less than 2 years in her 38I, with no complications   SOCIAL HISTORY:  Carreen is my neighbor. 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 Bill died from widely metastatic malignant melanoma within 4 months of diagnosis some years ago. The patient lives with her son [name] and his wife, who is expecting their first child late November 2016. The other son, [name], lives in [place]. Both sons are appraised her's. The patient has 2 grandchildren and as just noted one on the way. The patient attends the [employer]: Not in place. At the 10/24/2014 visit the patient was given the appropriate documents 2 complete and notarize at her discretion   HEALTH MAINTENANCE: History  Substance Use Topics  . Smoking status: Former Smoker     Types: Cigarettes    Quit date: 08/03/2014  . Smokeless tobacco: Not on file  . Alcohol Use: 3.6 oz/week    6 Glasses of wine per week     Colonoscopy: 2000?  PAP:  Bone density: On wrist, remote   Lipid panel:  Allergies  Allergen Reactions  . Aspirin     Stomach pain  . Aspirin Nausea Only    GI upset/pain    Current Outpatient Prescriptions  Medication Sig Dispense Refill  . dexamethasone (DECADRON) 4 MG tablet Take two tablets (8 mg) twice a day w food the day before chemo, then the day after chemo and the day after that; last dose the morning of the 4th day counting from chemo as day 1 40 tablet 1  . fluconazole (DIFLUCAN) 100 MG tablet Take one tablet daily starting day four from chemo and continuing for four days or until thrush resolves 30 tablet 1  . lidocaine-prilocaine (EMLA) cream Apply 1 application topically as needed. 30 g 0  . LORazepam (ATIVAN) 0.5 MG tablet Take 1 tablet (0.5 mg total) by mouth at bedtime. 30 tablet 0  . ondansetron (ZOFRAN) 8 MG tablet Take one tablet twice a day starting the evening of chemo and continuing through the evening of day 3 40 tablet  1  . albuterol (PROVENTIL HFA;VENTOLIN HFA) 108 (90 BASE) MCG/ACT inhaler Inhale 1-2 puffs into the lungs every 6 (six) hours as needed for wheezing or shortness of breath. (Patient not taking: Reported on 01/16/2015) 1 Inhaler 2  . hyoscyamine (ANASPAZ) 0.125 MG TBDP disintergrating tablet Place 1 tablet (0.125 mg total) under the tongue every 6 (six) hours as needed for cramping. 10 tablet 0  . prochlorperazine (COMPAZINE) 10 MG tablet Take 1 tablet (10 mg total) by mouth every 6 (six) hours as needed for nausea or vomiting. (Patient not taking: Reported on 01/03/2015) 30 tablet 0  . tobramycin-dexamethasone (TOBRADEX) ophthalmic solution Place 1 drop into both eyes every 4 (four) hours while awake. (Patient not taking: Reported on 12/06/2014) 5 mL 0   No current facility-administered medications for this  visit.    OBJECTIVE: middle-aged white woman in no acute distress Filed Vitals:   01/16/15 0836  BP: 130/66  Pulse: 70  Temp: 98 F (36.7 C)  Resp: 18     Body mass index is 19.16 kg/(m^2).    ECOG FS:1 - Symptomatic but completely ambulatory  Skin: warm, dry  HEENT: sclerae anicteric, conjunctivae pink, oropharynx clear. No thrush or mucositis.  Lymph Nodes: No cervical or supraclavicular lymphadenopathy  Lungs: clear to auscultation bilaterally, no rales, wheezes, or rhonci  Heart: regular rate and rhythm  Abdomen: round, soft, non tender, positive bowel sounds  Musculoskeletal: No focal spinal tenderness, no peripheral edema  Neuro: non focal, well oriented, positive affect  Breasts: deferred  LAB RESULTS:  CMP     Component Value Date/Time   NA 139 01/16/2015 0814   NA 140 11/01/2014 1023   K 3.7 01/16/2015 0814   K 4.2 11/01/2014 1023   CL 104 11/01/2014 1023   CO2 25 01/16/2015 0814   CO2 26 11/01/2014 1023   GLUCOSE 169* 01/16/2015 0814   GLUCOSE 93 11/01/2014 1023   BUN 12.3 01/16/2015 0814   BUN 10 11/01/2014 1023   CREATININE 0.8 01/16/2015 0814   CREATININE 0.85 11/01/2014 1023   CALCIUM 10.4 01/16/2015 0814   CALCIUM 9.9 11/01/2014 1023   PROT 6.6 01/16/2015 0814   ALBUMIN 4.2 01/16/2015 0814   AST 14 01/16/2015 0814   ALT 20 01/16/2015 0814   ALKPHOS 80 01/16/2015 0814   BILITOT 0.49 01/16/2015 0814   GFRNONAA >60 11/01/2014 1023   GFRAA >60 11/01/2014 1023    INo results found for: SPEP, UPEP  Lab Results  Component Value Date   WBC 6.2 01/16/2015   NEUTROABS 4.9 01/16/2015   HGB 12.6 01/16/2015   HCT 37.2 01/16/2015   MCV 97.2 01/16/2015   PLT 263 01/16/2015      Chemistry      Component Value Date/Time   NA 139 01/16/2015 0814   NA 140 11/01/2014 1023   K 3.7 01/16/2015 0814   K 4.2 11/01/2014 1023   CL 104 11/01/2014 1023   CO2 25 01/16/2015 0814   CO2 26 11/01/2014 1023   BUN 12.3 01/16/2015 0814   BUN 10 11/01/2014 1023    CREATININE 0.8 01/16/2015 0814   CREATININE 0.85 11/01/2014 1023      Component Value Date/Time   CALCIUM 10.4 01/16/2015 0814   CALCIUM 9.9 11/01/2014 1023   ALKPHOS 80 01/16/2015 0814   AST 14 01/16/2015 0814   ALT 20 01/16/2015 0814   BILITOT 0.49 01/16/2015 0814       No results found for: LABCA2  No components found for: DHRCB638  No results for input(s): INR in the last 168 hours.  Urinalysis    Component Value Date/Time   LABSPEC 1.020 12/21/2014 1405   PHURINE 5.0 12/21/2014 1405   GLUCOSEU Negative 12/21/2014 1405   HGBUR Negative 12/21/2014 1405   BILIRUBINUR Negative 12/21/2014 1405   KETONESUR Negative 12/21/2014 1405   PROTEINUR < 30 12/21/2014 1405   UROBILINOGEN 0.2 12/21/2014 1405   NITRITE Negative 12/21/2014 1405   LEUKOCYTESUR Small 12/21/2014 1405    STUDIES: Dg Chest 2 View  01/01/2015   CLINICAL DATA:  Breast cancer, chemotherapy. Increase cough and congestion.  EXAM: CHEST  2 VIEW  COMPARISON:  11/02/2014  FINDINGS: Left Port-A-Cath tip now flipped up into the right innominate vein, new since prior study. Mild hyperinflation of the lungs. Lungs are clear. No effusions. Heart is normal size. No acute bony abnormality.  IMPRESSION: Mild hyperinflation.  No active cardiopulmonary disease.  Left Port-A-Cath tip clips of and courses into the right innominate vein, new since prior study.   Electronically Signed   By: Rolm Baptise M.D.   On: 01/01/2015 13:05    ASSESSMENT: 69 y.o. [place] woman status post right breast biopsy 10/16/2014 for a clinical T3 N0, stage IIA invasive ductal carcinoma, grade 2 or 3, estrogen and progesterone receptor positive, with an MIB-1 of 20%, and HER-2 amplified, with a signals ratio of 3.60  (1) chemotherapy with anti-HER-2 immunotherapy to start 11/05/2014. This will consist of carboplatin, docetaxel, trastuzumab and pertuzumab given every 21 days 6, with Neulasta support  (2) trastuzumab will be continued to complete 1  year  (a) echocardiogram 10/31/2014 shows an ejection fraction of 55-60%.  (3) definitive surgery to follow chemotherapy  (4) adjuvant radiation to follow surgery as appropriate  (5) anti-estrogens to follow completion of local treatments.  PLAN: Anabia looks and feels great today. The labs were reviewed in detail and were entirely stable. She will proceed with cycle 4 of carboplatin, docetaxel, trastuzumab, and pertuzumab as planned today. Her next echocardiogram will be due this month, so I have placed the appropriate orders. We will continue to monitor the sensitivity to her fingertips as this is likely the beginning signs of peripheral neuropathy.   Tempestt visited Dr. [name] last week and was encouraged by the shrinkage of her palpable tumor. For this reason, we will not proceed with a midpoint breast MRI. She will be scanned at the completion of her chemotherapy prior to surgery.  Keyasia will return in 1 week for labs and a nadir visit. She understands and agrees with this plan. She knows the goal of treatment in her case is cure. She has been encouraged to call with any issues that might arise before her next visit here.  Laurie Panda, NP   01/16/2015 9:01 AM

## 2015-01-16 NOTE — Telephone Encounter (Signed)
Echo to linda for precert °

## 2015-01-16 NOTE — Patient Instructions (Signed)
[facility] Discharge Instructions for Patients Receiving Chemotherapy  Today you received the following chemotherapy agents taxotere, carboplatin, herceptin, perjeta  To help prevent nausea and vomiting after your treatment, we encourage you to take your nausea medicationIf you develop nausea and vomiting that is not controlled by your nausea medication, call the clinic.   BELOW ARE SYMPTOMS THAT SHOULD BE REPORTED IMMEDIATELY:  *FEVER GREATER THAN 100.5 F  *CHILLS WITH OR WITHOUT FEVER  NAUSEA AND VOMITING THAT IS NOT CONTROLLED WITH YOUR NAUSEA MEDICATION  *UNUSUAL SHORTNESS OF BREATH  *UNUSUAL BRUISING OR BLEEDING  TENDERNESS IN MOUTH AND THROAT WITH OR WITHOUT PRESENCE OF ULCERS  *URINARY PROBLEMS  *BOWEL PROBLEMS  UNUSUAL RASH Items with * indicate a potential emergency and should be followed up as soon as possible.  Feel free to call the clinic you have any questions or concerns. The clinic phone number is [phone].  Please show the [facility] at check-in to the Emergency Department and triage nurse.

## 2015-01-17 ENCOUNTER — Other Ambulatory Visit: Payer: Self-pay

## 2015-01-17 ENCOUNTER — Ambulatory Visit: Payer: Self-pay

## 2015-01-18 ENCOUNTER — Ambulatory Visit (HOSPITAL_BASED_OUTPATIENT_CLINIC_OR_DEPARTMENT_OTHER): Payer: Medicare Other

## 2015-01-18 VITALS — BP 137/76 | HR 73 | Temp 98.2°F

## 2015-01-18 DIAGNOSIS — Z315 Encounter for genetic counseling: Secondary | ICD-10-CM | POA: Diagnosis not present

## 2015-01-18 DIAGNOSIS — C50811 Malignant neoplasm of overlapping sites of right female breast: Secondary | ICD-10-CM | POA: Diagnosis present

## 2015-01-18 DIAGNOSIS — Z5189 Encounter for other specified aftercare: Secondary | ICD-10-CM

## 2015-01-18 DIAGNOSIS — C50411 Malignant neoplasm of upper-outer quadrant of right female breast: Secondary | ICD-10-CM

## 2015-01-18 MED ORDER — PEGFILGRASTIM INJECTION 6 MG/0.6ML ~~LOC~~
6.0000 mg | PREFILLED_SYRINGE | Freq: Once | SUBCUTANEOUS | Status: AC
  Administered 2015-01-18: 6 mg via SUBCUTANEOUS
  Filled 2015-01-18: qty 0.6

## 2015-01-19 ENCOUNTER — Ambulatory Visit: Payer: Self-pay

## 2015-01-23 ENCOUNTER — Encounter: Payer: Self-pay | Admitting: Nurse Practitioner

## 2015-01-23 ENCOUNTER — Other Ambulatory Visit (HOSPITAL_BASED_OUTPATIENT_CLINIC_OR_DEPARTMENT_OTHER): Payer: Medicare Other

## 2015-01-23 ENCOUNTER — Telehealth: Payer: Self-pay | Admitting: Nurse Practitioner

## 2015-01-23 ENCOUNTER — Ambulatory Visit (HOSPITAL_BASED_OUTPATIENT_CLINIC_OR_DEPARTMENT_OTHER): Payer: Medicare Other | Admitting: Nurse Practitioner

## 2015-01-23 VITALS — BP 130/63 | HR 75 | Temp 97.7°F | Resp 18 | Ht 66.5 in | Wt 116.4 lb

## 2015-01-23 DIAGNOSIS — C50411 Malignant neoplasm of upper-outer quadrant of right female breast: Secondary | ICD-10-CM

## 2015-01-23 DIAGNOSIS — C50811 Malignant neoplasm of overlapping sites of right female breast: Secondary | ICD-10-CM

## 2015-01-23 DIAGNOSIS — Z170 Estrogen receptor positive status [ER+]: Secondary | ICD-10-CM

## 2015-01-23 LAB — CBC WITH DIFFERENTIAL/PLATELET
BASO%: 0.3 % (ref 0.0–2.0)
BASOS ABS: 0 10*3/uL (ref 0.0–0.1)
EOS%: 0.1 % (ref 0.0–7.0)
Eosinophils Absolute: 0 10*3/uL (ref 0.0–0.5)
HEMATOCRIT: 36.2 % (ref 34.8–46.6)
HEMOGLOBIN: 12.2 g/dL (ref 11.6–15.9)
LYMPH%: 31.9 % (ref 14.0–49.7)
MCH: 33.1 pg (ref 25.1–34.0)
MCHC: 33.8 g/dL (ref 31.5–36.0)
MCV: 98 fL (ref 79.5–101.0)
MONO#: 0.8 10*3/uL (ref 0.1–0.9)
MONO%: 18.3 % — AB (ref 0.0–14.0)
NEUT%: 49.4 % (ref 38.4–76.8)
NEUTROS ABS: 2.2 10*3/uL (ref 1.5–6.5)
PLATELETS: 143 10*3/uL — AB (ref 145–400)
RBC: 3.69 10*6/uL — ABNORMAL LOW (ref 3.70–5.45)
RDW: 15 % — ABNORMAL HIGH (ref 11.2–14.5)
WBC: 4.6 10*3/uL (ref 3.9–10.3)
lymph#: 1.5 10*3/uL (ref 0.9–3.3)

## 2015-01-23 LAB — COMPREHENSIVE METABOLIC PANEL (CC13)
ALK PHOS: 78 U/L (ref 40–150)
ALT: 28 U/L (ref 0–55)
ANION GAP: 7 meq/L (ref 3–11)
AST: 14 U/L (ref 5–34)
Albumin: 4.1 g/dL (ref 3.5–5.0)
BILIRUBIN TOTAL: 0.9 mg/dL (ref 0.20–1.20)
BUN: 7.5 mg/dL (ref 7.0–26.0)
CHLORIDE: 103 meq/L (ref 98–109)
CO2: 27 mEq/L (ref 22–29)
CREATININE: 0.8 mg/dL (ref 0.6–1.1)
Calcium: 9.7 mg/dL (ref 8.4–10.4)
EGFR: 77 mL/min/{1.73_m2} — ABNORMAL LOW (ref >90)
Glucose: 101 mg/dl (ref 70–140)
Potassium: 3.7 mEq/L (ref 3.5–5.1)
Sodium: 138 mEq/L (ref 136–145)
TOTAL PROTEIN: 6.2 g/dL — AB (ref 6.4–8.3)

## 2015-01-23 NOTE — Progress Notes (Signed)
[place]  [phone] [phone]) [phone]     ID: Diana Dunn DOB: [birth year]  MR#: [id number]  BOF#:[id number]  Patient Care Team: Harle Battiest, MD as PCP - General (Obstetrics and Gynecology) Autumn Messing III, MD as Consulting Physician (General Surgery) Chauncey Cruel, MD as Consulting Physician (Oncology) Eppie Gibson, MD as Attending Physician (Radiation Oncology) Mauro Kaufmann, RN as Registered Nurse Rockwell Germany, RN as Registered Nurse Holley Bouche, NP as Nurse Practitioner (Nurse Practitioner) Lona Kettle, MD (Family Medicine) PCP: Harle Battiest, MD OTHER MD:  CHIEF COMPLAINT: Triple positive breast cancer  CURRENT TREATMENT: Chemotherapy and anti-HER-2 immunotherapy  BREAST CANCER HISTORY: From the original intake note:  Diana Dunn had not had a mammogram for approximately 4 years. Sometime mid 2015 she had an area of redness and tenderness in the right breast and she brought this to her physician's attention. She was treated with antibiotics and this completely cleared.Marland Kitchen  Approximately mid April, she again developed redness over the right breast. This was very focal, and it did not look "as red" as the previous time. She brought it to Dr. Harrington Challenger is attention, and was treated with antibiotics. Bilateral mammography with tomosynthesis and right breast ultrasonography was also obtained on 09/19/2014. There was a focal opacity with irregular margins in the upper outer right breast with some central calcifications. There was mild architectural distortion associated with this. On exam Dr. [name] noted a firm palpable tender mass lateral to the right areola with overlying erythema. On ultrasonography there was an irregular hypoechoic mass measuring 2.4 cm.  Repeat right breast ultrasonography after the patient completed her antibiotic course was performed 10/04/2014. On exam there was still a palpable lobulated mass lateral to the right nipple and  by ultrasound this measured 2.6 cm. It was felt to be a possible abscess and a second course of antibiotics, now with Septra, was undertaken. After that treatment, repeat ultrasonography 10/16/2014 continued to show the palpable mass and ultrasonography now found the mass to measure 2.9 cm maximally.  Accordingly biopsy of the mass in question was obtained that same day, 10/16/2014, and showed ([id number]) and invasive ductal carcinoma, grade 2 or 3, estrogen receptor 90% positive, progesterone receptor 80% positive, both with strong staining intensity, with an MIB-1 of 20%, and with HER-2 amplification, the signals ratio being 3.60 and the number per cell 4.50.  Bilateral breast MRI 10/23/2014 showed a breast density to be category B. In the right breast there was an irregular enhancing mass at the 9:30 o'clock position measuring 3.1 cm. There were no additional worrisome masses in the right breast, no findings of concern in the left breast, and no abnormal appearing lymph nodes.  The patient's subsequent history is as detailed below  INTERVAL HISTORY: Diana Dunn returns today for follow-up of her HER-2 positive breast cancer. Today is day 8, cycle 4 of 6 planned cycles of carboplatin, docetaxel, trastuzumab and pertuzumab, with neulasta for granulocyte support.   REVIEW OF SYSTEMS: Diana Dunn denies fevers or chills. Her bowels are soft but not runny. She has had a lack of appetite secondary to taste changes, but has been supplementing with a protein shake. She denies mouth sores or thrush. She has a continued sensitivity to her fingertips that comes and goes. She has had more bone and back pain from her neulasta this cycle. She only took 1 tylenol this morning, but it continues to bother her. A detailed review of systems is otherwise stable.  PAST MEDICAL HISTORY: Past  Medical History  Diagnosis Date  . Breast cancer of upper-outer quadrant of right female breast 10/19/2014  . Breast cancer   .  History of hiatal hernia   . IBS (irritable bowel syndrome)   . Lactose intolerance   . Cancer     RIGHT BREAST CANCER  Irritable bowel symptoms  PAST SURGICAL HISTORY: Past Surgical History  Procedure Laterality Date  . Breast surgery      BIOPSY OF BREAST  . Colonoscopy    . Portacath placement Left 11/02/2014    Procedure: INSERTION PORT-A-CATH;  Surgeon: Autumn Messing III, MD;  Location: [facility];  Service: General;  Laterality: Left;    FAMILY HISTORY No family history on file. The patient's father died at the age of 25 from a myocardial infarction. The patient's mother died at the age of 25 following a stroke. The patient had 2 brothers. One had Down's syndrome. She had no sisters. The only breast cancer in the family was the patient's father's only sister was diagnosed with breast cancer in her 67s. There is no history of ovarian cancer in the family to the patient's knowledge   GYNECOLOGIC HISTORY:  No LMP recorded. Patient is postmenopausal.  menarche age 29, first live birth age 9, she is Diana Dunn P2. She went through the change of life in late 1999. She did not take hormone replacement. She took birth control pills for less than 2 years in her 69M, with no complications   SOCIAL HISTORY:  Diana Dunn is my neighbor. 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 Diana Dunn died from widely metastatic malignant melanoma within 4 months of diagnosis some years ago. The patient lives with her [name] and his wife, who is expecting their first child late November 2016. The other son, Diana Dunn, lives in [place]. Both sons are appraised her's. The patient has 2 grandchildren and as just noted one on the way. The patient attends the [employer]: Not in place. At the 10/24/2014 visit the patient was given the appropriate documents 2 complete and notarize at her discretion   HEALTH MAINTENANCE: Social History  Substance Use Topics    . Smoking status: Former Smoker    Types: Cigarettes    Quit date: 08/03/2014  . Smokeless tobacco: Not on file  . Alcohol Use: 3.6 oz/week    6 Glasses of wine per week     Colonoscopy: 2000?  PAP:  Bone density: On wrist, remote   Lipid panel:  Allergies  Allergen Reactions  . Aspirin     Stomach pain  . Aspirin Nausea Only    GI upset/pain    Current Outpatient Prescriptions  Medication Sig Dispense Refill  . dexamethasone (DECADRON) 4 MG tablet Take two tablets (8 mg) twice a day w food the day before chemo, then the day after chemo and the day after that; last dose the morning of the 4th day counting from chemo as day 1 40 tablet 1  . fluconazole (DIFLUCAN) 100 MG tablet Take one tablet daily starting day four from chemo and continuing for four days or until thrush resolves 30 tablet 1  . lidocaine-prilocaine (EMLA) cream Apply 1 application topically as needed. 30 g 0  . LORazepam (ATIVAN) 0.5 MG tablet Take 1 tablet (0.5 mg total) by mouth at bedtime. 30 tablet 0  . ondansetron (ZOFRAN) 8 MG tablet Take one tablet twice a day starting the evening of  chemo and continuing through the evening of day 3 40 tablet 1  . albuterol (PROVENTIL HFA;VENTOLIN HFA) 108 (90 BASE) MCG/ACT inhaler Inhale 1-2 puffs into the lungs every 6 (six) hours as needed for wheezing or shortness of breath. (Patient not taking: Reported on 01/16/2015) 1 Inhaler 2  . hyoscyamine (ANASPAZ) 0.125 MG TBDP disintergrating tablet Place 1 tablet (0.125 mg total) under the tongue every 6 (six) hours as needed for cramping. (Patient not taking: Reported on 01/23/2015) 10 tablet 0  . prochlorperazine (COMPAZINE) 10 MG tablet Take 1 tablet (10 mg total) by mouth every 6 (six) hours as needed for nausea or vomiting. (Patient not taking: Reported on 01/03/2015) 30 tablet 0  . tobramycin-dexamethasone (TOBRADEX) ophthalmic solution Place 1 drop into both eyes every 4 (four) hours while awake. (Patient not taking: Reported  on 12/06/2014) 5 mL 0   No current facility-administered medications for this visit.    OBJECTIVE: middle-aged white woman in no acute distress Filed Vitals:   01/23/15 0901  BP: 130/63  Pulse: 75  Temp: 97.7 F (36.5 C)  Resp: 18     Body mass index is 18.51 kg/(m^2).    ECOG FS:1 - Symptomatic but completely ambulatory  Sclerae unicteric, pupils round and equal Oropharynx clear and moist-- no thrush or other lesions No cervical or supraclavicular adenopathy Lungs no rales or rhonchi Heart regular rate and rhythm Abd soft, nontender, positive bowel sounds MSK no focal spinal tenderness, no upper extremity lymphedema Neuro: nonfocal, well oriented, appropriate affect Breasts: deferred  LAB RESULTS:  CMP     Component Value Date/Time   NA 139 01/16/2015 0814   NA 140 11/01/2014 1023   K 3.7 01/16/2015 0814   K 4.2 11/01/2014 1023   CL 104 11/01/2014 1023   CO2 25 01/16/2015 0814   CO2 26 11/01/2014 1023   GLUCOSE 169* 01/16/2015 0814   GLUCOSE 93 11/01/2014 1023   BUN 12.3 01/16/2015 0814   BUN 10 11/01/2014 1023   CREATININE 0.8 01/16/2015 0814   CREATININE 0.85 11/01/2014 1023   CALCIUM 10.4 01/16/2015 0814   CALCIUM 9.9 11/01/2014 1023   PROT 6.6 01/16/2015 0814   ALBUMIN 4.2 01/16/2015 0814   AST 14 01/16/2015 0814   ALT 20 01/16/2015 0814   ALKPHOS 80 01/16/2015 0814   BILITOT 0.49 01/16/2015 0814   GFRNONAA >60 11/01/2014 1023   GFRAA >60 11/01/2014 1023    INo results found for: SPEP, UPEP  Lab Results  Component Value Date   WBC 4.6 01/23/2015   NEUTROABS 2.2 01/23/2015   HGB 12.2 01/23/2015   HCT 36.2 01/23/2015   MCV 98.0 01/23/2015   PLT 143* 01/23/2015      Chemistry      Component Value Date/Time   NA 139 01/16/2015 0814   NA 140 11/01/2014 1023   K 3.7 01/16/2015 0814   K 4.2 11/01/2014 1023   CL 104 11/01/2014 1023   CO2 25 01/16/2015 0814   CO2 26 11/01/2014 1023   BUN 12.3 01/16/2015 0814   BUN 10 11/01/2014 1023    CREATININE 0.8 01/16/2015 0814   CREATININE 0.85 11/01/2014 1023      Component Value Date/Time   CALCIUM 10.4 01/16/2015 0814   CALCIUM 9.9 11/01/2014 1023   ALKPHOS 80 01/16/2015 0814   AST 14 01/16/2015 0814   ALT 20 01/16/2015 0814   BILITOT 0.49 01/16/2015 0814       No results found for: LABCA2  No components found for: MQKMM381  No results for input(s): INR in the last 168 hours.  Urinalysis    Component Value Date/Time   LABSPEC 1.020 12/21/2014 1405   PHURINE 5.0 12/21/2014 1405   GLUCOSEU Negative 12/21/2014 1405   HGBUR Negative 12/21/2014 1405   BILIRUBINUR Negative 12/21/2014 1405   KETONESUR Negative 12/21/2014 1405   PROTEINUR < 30 12/21/2014 1405   UROBILINOGEN 0.2 12/21/2014 1405   NITRITE Negative 12/21/2014 1405   LEUKOCYTESUR Small 12/21/2014 1405    STUDIES: Dg Chest 2 View  01/01/2015   CLINICAL DATA:  Breast cancer, chemotherapy. Increase cough and congestion.  EXAM: CHEST  2 VIEW  COMPARISON:  11/02/2014  FINDINGS: Left Port-A-Cath tip now flipped up into the right innominate vein, new since prior study. Mild hyperinflation of the lungs. Lungs are clear. No effusions. Heart is normal size. No acute bony abnormality.  IMPRESSION: Mild hyperinflation.  No active cardiopulmonary disease.  Left Port-A-Cath tip clips of and courses into the right innominate vein, new since prior study.   Electronically Signed   By: Rolm Baptise M.D.   On: 01/01/2015 13:05    ASSESSMENT: 69 y.o.  woman status post right breast biopsy 10/16/2014 for a clinical T3 N0, stage IIA invasive ductal carcinoma, grade 2 or 3, estrogen and progesterone receptor positive, with an MIB-1 of 20%, and HER-2 amplified, with a signals ratio of 3.60  (1) chemotherapy with anti-HER-2 immunotherapy to start 11/05/2014. This will consist of carboplatin, docetaxel, trastuzumab and pertuzumab given every 21 days 6, with Neulasta support  (2) trastuzumab will be continued to complete 1  year  (a) echocardiogram 10/31/2014 shows an ejection fraction of 55-60%.  (3) definitive surgery to follow chemotherapy  (4) adjuvant radiation to follow surgery as appropriate  (5) anti-estrogens to follow completion of local treatments.  PLAN: Jiselle had a rougher time this cycle, but I think if she continues the claritin for 5-7 days next time and takes tylenol 685m 3-4 times daily, her bone pain should be lessened. The labs were reviewed in detail and were entirely stable. She will have a repeat echocardiogram tomorrow.  BXoeywill return in 2 weeks for cycle 5 of docetaxel and carboplatin.  She understands and agrees with this plan. She knows the goal of treatment in her case is cure. She has been encouraged to call with any issues that might arise before her next visit here.  HLaurie Panda NP   01/23/2015 9:12 AM

## 2015-01-23 NOTE — Telephone Encounter (Signed)
Called and left a message with echo appointment and she will get it on an avs today

## 2015-01-24 ENCOUNTER — Ambulatory Visit (HOSPITAL_COMMUNITY)
Admission: RE | Admit: 2015-01-24 | Discharge: 2015-01-24 | Disposition: A | Discharge status: Home / Self Care | Payer: Medicare Other | Source: Ambulatory Visit | Attending: Nurse Practitioner | Admitting: Nurse Practitioner

## 2015-01-24 DIAGNOSIS — C50411 Malignant neoplasm of upper-outer quadrant of right female breast: Secondary | ICD-10-CM | POA: Insufficient documentation

## 2015-01-24 DIAGNOSIS — Z5181 Encounter for therapeutic drug level monitoring: Secondary | ICD-10-CM | POA: Insufficient documentation

## 2015-01-24 DIAGNOSIS — Z79899 Other long term (current) drug therapy: Secondary | ICD-10-CM

## 2015-01-24 NOTE — Progress Notes (Signed)
Echocardiogram 2D Echocardiogram has been performed.  Diana Dunn 01/24/2015, 10:13 AM

## 2015-02-05 ENCOUNTER — Telehealth: Payer: Self-pay | Admitting: *Deleted

## 2015-02-05 NOTE — Telephone Encounter (Signed)
"  I have tearing of my eye.  Do I take this paper to the pharmacy or does nurse."  Call routed to collaborative voicemail who handles refills.  Called patient instructing her to call pharmacy.  With this call learned she "never did have prescription for Tobradex filled 11-09-2014.  Eyes have just started tearing and wants to know if it is a valid prescription to take in to her pharmacy.  Informed her this hasn't expired.

## 2015-02-07 ENCOUNTER — Encounter: Payer: Self-pay | Admitting: *Deleted

## 2015-02-07 ENCOUNTER — Ambulatory Visit (HOSPITAL_BASED_OUTPATIENT_CLINIC_OR_DEPARTMENT_OTHER): Payer: Medicare Other | Admitting: Nurse Practitioner

## 2015-02-07 ENCOUNTER — Ambulatory Visit (HOSPITAL_BASED_OUTPATIENT_CLINIC_OR_DEPARTMENT_OTHER): Payer: Medicare Other

## 2015-02-07 ENCOUNTER — Encounter: Payer: Self-pay | Admitting: Nurse Practitioner

## 2015-02-07 ENCOUNTER — Other Ambulatory Visit (HOSPITAL_BASED_OUTPATIENT_CLINIC_OR_DEPARTMENT_OTHER): Payer: Medicare Other

## 2015-02-07 VITALS — BP 139/65 | HR 70 | Temp 98.1°F | Resp 18 | Ht 66.5 in | Wt 117.2 lb

## 2015-02-07 DIAGNOSIS — C50811 Malignant neoplasm of overlapping sites of right female breast: Secondary | ICD-10-CM

## 2015-02-07 DIAGNOSIS — Z5112 Encounter for antineoplastic immunotherapy: Secondary | ICD-10-CM

## 2015-02-07 DIAGNOSIS — Z170 Estrogen receptor positive status [ER+]: Secondary | ICD-10-CM

## 2015-02-07 DIAGNOSIS — C50411 Malignant neoplasm of upper-outer quadrant of right female breast: Secondary | ICD-10-CM

## 2015-02-07 DIAGNOSIS — Z5111 Encounter for antineoplastic chemotherapy: Secondary | ICD-10-CM

## 2015-02-07 LAB — CBC WITH DIFFERENTIAL/PLATELET
BASO%: 0 % (ref 0.0–2.0)
BASOS ABS: 0 10*3/uL (ref 0.0–0.1)
EOS ABS: 0 10*3/uL (ref 0.0–0.5)
EOS%: 0 % (ref 0.0–7.0)
HEMATOCRIT: 34.5 % — AB (ref 34.8–46.6)
HEMOGLOBIN: 11.6 g/dL (ref 11.6–15.9)
LYMPH#: 0.5 10*3/uL — AB (ref 0.9–3.3)
LYMPH%: 14 % (ref 14.0–49.7)
MCH: 33.8 pg (ref 25.1–34.0)
MCHC: 33.6 g/dL (ref 31.5–36.0)
MCV: 100.6 fL (ref 79.5–101.0)
MONO#: 0.4 10*3/uL (ref 0.1–0.9)
MONO%: 10.1 % (ref 0.0–14.0)
NEUT#: 2.9 10*3/uL (ref 1.5–6.5)
NEUT%: 75.9 % (ref 38.4–76.8)
PLATELETS: 176 10*3/uL (ref 145–400)
RBC: 3.43 10*6/uL — ABNORMAL LOW (ref 3.70–5.45)
RDW: 15 % — AB (ref 11.2–14.5)
WBC: 3.9 10*3/uL (ref 3.9–10.3)

## 2015-02-07 LAB — COMPREHENSIVE METABOLIC PANEL (CC13)
ALK PHOS: 71 U/L (ref 40–150)
ALT: 22 U/L (ref 0–55)
AST: 13 U/L (ref 5–34)
Albumin: 4 g/dL (ref 3.5–5.0)
Anion Gap: 10 mEq/L (ref 3–11)
BUN: 10.9 mg/dL (ref 7.0–26.0)
CALCIUM: 10.4 mg/dL (ref 8.4–10.4)
CO2: 24 mEq/L (ref 22–29)
Chloride: 107 mEq/L (ref 98–109)
Creatinine: 0.8 mg/dL (ref 0.6–1.1)
EGFR: 76 mL/min/{1.73_m2} — AB (ref >90)
Glucose: 138 mg/dl (ref 70–140)
POTASSIUM: 3.9 meq/L (ref 3.5–5.1)
Sodium: 141 mEq/L (ref 136–145)
Total Bilirubin: 0.48 mg/dL (ref 0.20–1.20)
Total Protein: 6.1 g/dL — ABNORMAL LOW (ref 6.4–8.3)

## 2015-02-07 MED ORDER — ACETAMINOPHEN 325 MG PO TABS
ORAL_TABLET | ORAL | Status: AC
  Filled 2015-02-07: qty 2

## 2015-02-07 MED ORDER — CARBOPLATIN CHEMO INJECTION 450 MG/45ML
390.0000 mg | Freq: Once | INTRAVENOUS | Status: AC
  Administered 2015-02-07: 390 mg via INTRAVENOUS
  Filled 2015-02-07: qty 39

## 2015-02-07 MED ORDER — HEPARIN SOD (PORK) LOCK FLUSH 100 UNIT/ML IV SOLN
500.0000 [IU] | Freq: Once | INTRAVENOUS | Status: AC | PRN
  Administered 2015-02-07: 500 [IU]
  Filled 2015-02-07: qty 5

## 2015-02-07 MED ORDER — DIPHENHYDRAMINE HCL 25 MG PO CAPS
ORAL_CAPSULE | ORAL | Status: AC
  Filled 2015-02-07: qty 1

## 2015-02-07 MED ORDER — SODIUM CHLORIDE 0.9 % IV SOLN
Freq: Once | INTRAVENOUS | Status: AC
  Administered 2015-02-07: 10:00:00 via INTRAVENOUS

## 2015-02-07 MED ORDER — SODIUM CHLORIDE 0.9 % IV SOLN
420.0000 mg | Freq: Once | INTRAVENOUS | Status: AC
  Administered 2015-02-07: 420 mg via INTRAVENOUS
  Filled 2015-02-07: qty 14

## 2015-02-07 MED ORDER — DIPHENHYDRAMINE HCL 25 MG PO CAPS
25.0000 mg | ORAL_CAPSULE | Freq: Once | ORAL | Status: AC
  Administered 2015-02-07: 25 mg via ORAL

## 2015-02-07 MED ORDER — SODIUM CHLORIDE 0.9 % IV SOLN
Freq: Once | INTRAVENOUS | Status: AC
  Administered 2015-02-07: 10:00:00 via INTRAVENOUS
  Filled 2015-02-07: qty 8

## 2015-02-07 MED ORDER — SODIUM CHLORIDE 0.9 % IV SOLN
6.0000 mg/kg | Freq: Once | INTRAVENOUS | Status: AC
  Administered 2015-02-07: 357 mg via INTRAVENOUS
  Filled 2015-02-07: qty 17

## 2015-02-07 MED ORDER — SODIUM CHLORIDE 0.9 % IJ SOLN
10.0000 mL | INTRAMUSCULAR | Status: DC | PRN
  Administered 2015-02-07: 10 mL
  Filled 2015-02-07: qty 10

## 2015-02-07 MED ORDER — ACETAMINOPHEN 325 MG PO TABS
650.0000 mg | ORAL_TABLET | Freq: Once | ORAL | Status: AC
  Administered 2015-02-07: 650 mg via ORAL

## 2015-02-07 MED ORDER — DOCETAXEL CHEMO INJECTION 160 MG/16ML
75.0000 mg/m2 | Freq: Once | INTRAVENOUS | Status: AC
  Administered 2015-02-07: 130 mg via INTRAVENOUS
  Filled 2015-02-07: qty 13

## 2015-02-07 NOTE — Progress Notes (Signed)
[facility]  [phone] [phone]     ID: Diana Dunn DOB: 07-29-1945  MR#: [id number]  JOI#:[id number]  Patient Care Team: Harle Battiest, MD as PCP - General (Obstetrics and Gynecology) Autumn Messing III, MD as Consulting Physician (General Surgery) Chauncey Cruel, MD as Consulting Physician (Oncology) Eppie Gibson, MD as Attending Physician (Radiation Oncology) Mauro Kaufmann, RN as Registered Nurse Rockwell Germany, RN as Registered Nurse Holley Bouche, NP as Nurse Practitioner (Nurse Practitioner) Lona Kettle, MD (Family Medicine) PCP: Harle Battiest, MD OTHER MD:  CHIEF COMPLAINT: Triple positive breast cancer  CURRENT TREATMENT: Chemotherapy and anti-HER-2 immunotherapy  BREAST CANCER HISTORY: From the original intake note:  Diana Dunn had not had a mammogram for approximately 4 years. Sometime mid 2015 she had an area of redness and tenderness in the right breast and she brought this to her physician's attention. She was treated with antibiotics and this completely cleared.Marland Kitchen  Approximately mid April, she again developed redness over the right breast. This was very focal, and it did not look "as red" as the previous time. She brought it to Dr. Harrington Challenger is attention, and was treated with antibiotics. Bilateral mammography with tomosynthesis and right breast ultrasonography was also obtained on 09/19/2014. There was a focal opacity with irregular margins in the upper outer right breast with some central calcifications. There was mild architectural distortion associated with this. On exam Dr. [name] noted a firm palpable tender mass lateral to the right areola with overlying erythema. On ultrasonography there was an irregular hypoechoic mass measuring 2.4 cm.  Repeat right breast ultrasonography after the patient completed her antibiotic course was performed 10/04/2014. On exam there was still a palpable lobulated mass lateral to the right nipple and  by ultrasound this measured 2.6 cm. It was felt to be a possible abscess and a second course of antibiotics, now with Septra, was undertaken. After that treatment, repeat ultrasonography 10/16/2014 continued to show the palpable mass and ultrasonography now found the mass to measure 2.9 cm maximally.  Accordingly biopsy of the mass in question was obtained that same day, 10/16/2014, and showed ([id number]) and invasive ductal carcinoma, grade 2 or 3, estrogen receptor 90% positive, progesterone receptor 80% positive, both with strong staining intensity, with an MIB-1 of 20%, and with HER-2 amplification, the signals ratio being 3.60 and the number per cell 4.50.  Bilateral breast MRI 10/23/2014 showed a breast density to be category B. In the right breast there was an irregular enhancing mass at the 9:30 o'clock position measuring 3.1 cm. There were no additional worrisome masses in the right breast, no findings of concern in the left breast, and no abnormal appearing lymph nodes.  The patient's subsequent history is as detailed below  INTERVAL HISTORY: Diana Dunn returns today for follow-up of her HER-2 positive breast cancer. Today is day 1, cycle 5 of 6 planned cycles of carboplatin, docetaxel, trastuzumab and pertuzumab, with neulasta for granulocyte support.   REVIEW OF SYSTEMS: Diana Dunn has few complaints today. Since her last visit, her eyes became dry but teared excessively. She got a contact stuck in her right eye that had to be fished out by her ophthalmologist. She is using tobradex drops and wearing her glasses full time now. Her vision is unchanged. She has had no more sensitivity to her fingertips. Her appetite and energy level are improved. She denies fevers, chills, nausea, vomiting, or changes in bowel or bladder habits. A detailed review of systems is otherwise stable.  PAST MEDICAL HISTORY: Past Medical History  Diagnosis Date  . Breast cancer of upper-outer quadrant of right female  breast 10/19/2014  . Breast cancer   . History of hiatal hernia   . IBS (irritable bowel syndrome)   . Lactose intolerance   . Cancer     RIGHT BREAST CANCER  Irritable bowel symptoms  PAST SURGICAL HISTORY: Past Surgical History  Procedure Laterality Date  . Breast surgery      BIOPSY OF BREAST  . Colonoscopy    . Portacath placement Left 11/02/2014    Procedure: INSERTION PORT-A-CATH;  Surgeon: Autumn Messing III, MD;  Location: [facility];  Service: General;  Laterality: Left;    FAMILY HISTORY No family history on file. The patient's father died at the age of 43 from a myocardial infarction. The patient's mother died at the age of 66 following a stroke. The patient had 2 brothers. One had Down's syndrome. She had no sisters. The only breast cancer in the family was the patient's father's only sister was diagnosed with breast cancer in her 2s. There is no history of ovarian cancer in the family to the patient's knowledge   GYNECOLOGIC HISTORY:  No LMP recorded. Patient is postmenopausal.  menarche age 59, first live birth age 35, she is Diana Dunn P2. She went through the change of life in late 1999. She did not take hormone replacement. She took birth control pills for less than 2 years in her 21H, with no complications   SOCIAL HISTORY:  Diana Dunn is my neighbor. 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 Diana Dunn died from widely metastatic malignant melanoma within 4 months of diagnosis some years ago. The patient lives with her [name] and his wife, who is expecting their first child late November 2016. The other son, Diana Dunn, lives in [place]. Both sons are appraised her's. The patient has 2 grandchildren and as just noted one on the way. The patient attends the [employer]: Not in place. At the 10/24/2014 visit the patient was given the appropriate documents 2 complete and notarize at her discretion   HEALTH  MAINTENANCE: Social History  Substance Use Topics  . Smoking status: Former Smoker    Types: Cigarettes    Quit date: 08/03/2014  . Smokeless tobacco: Not on file  . Alcohol Use: 3.6 oz/week    6 Glasses of wine per week     Colonoscopy: 2000?  PAP:  Bone density: On wrist, remote   Lipid panel:  Allergies  Allergen Reactions  . Aspirin     Stomach pain  . Aspirin Nausea Only    GI upset/pain    Current Outpatient Prescriptions  Medication Sig Dispense Refill  . dexamethasone (DECADRON) 4 MG tablet Take two tablets (8 mg) twice a day w food the day before chemo, then the day after chemo and the day after that; last dose the morning of the 4th day counting from chemo as day 1 40 tablet 1  . fluconazole (DIFLUCAN) 100 MG tablet Take one tablet daily starting day four from chemo and continuing for four days or until thrush resolves 30 tablet 1  . lidocaine-prilocaine (EMLA) cream Apply 1 application topically as needed. 30 g 0  . LORazepam (ATIVAN) 0.5 MG tablet Take 1 tablet (0.5 mg total) by mouth at bedtime. 30 tablet 0  . ondansetron (ZOFRAN) 8 MG tablet Take one tablet twice a day starting  the evening of chemo and continuing through the evening of day 3 40 tablet 1  . tobramycin-dexamethasone (TOBRADEX) ophthalmic solution Place 1 drop into both eyes every 4 (four) hours while awake. 5 mL 0  . albuterol (PROVENTIL HFA;VENTOLIN HFA) 108 (90 BASE) MCG/ACT inhaler Inhale 1-2 puffs into the lungs every 6 (six) hours as needed for wheezing or shortness of breath. (Patient not taking: Reported on 01/16/2015) 1 Inhaler 2  . hyoscyamine (ANASPAZ) 0.125 MG TBDP disintergrating tablet Place 1 tablet (0.125 mg total) under the tongue every 6 (six) hours as needed for cramping. (Patient not taking: Reported on 01/23/2015) 10 tablet 0  . prochlorperazine (COMPAZINE) 10 MG tablet Take 1 tablet (10 mg total) by mouth every 6 (six) hours as needed for nausea or vomiting. (Patient not taking:  Reported on 01/03/2015) 30 tablet 0   No current facility-administered medications for this visit.    OBJECTIVE: middle-aged white woman in no acute distress Filed Vitals:   02/07/15 0919  BP: 139/65  Pulse: 70  Temp: 98.1 F (36.7 C)  Resp: 18     Body mass index is 18.64 kg/(m^2).    ECOG FS:1 - Symptomatic but completely ambulatory  Skin: warm, dry  HEENT: sclerae anicteric, conjunctivae pink, oropharynx clear. No thrush or mucositis.  Lymph Nodes: No cervical or supraclavicular lymphadenopathy  Lungs: clear to auscultation bilaterally, no rales, wheezes, or rhonci  Heart: regular rate and rhythm  Abdomen: round, soft, non tender, positive bowel sounds  Musculoskeletal: No focal spinal tenderness, no peripheral edema  Neuro: non focal, well oriented, positive affect  Breasts; deferred  LAB RESULTS:  CMP     Component Value Date/Time   NA 141 02/07/2015 0907   NA 140 11/01/2014 1023   K 3.9 02/07/2015 0907   K 4.2 11/01/2014 1023   CL 104 11/01/2014 1023   CO2 24 02/07/2015 0907   CO2 26 11/01/2014 1023   GLUCOSE 138 02/07/2015 0907   GLUCOSE 93 11/01/2014 1023   BUN 10.9 02/07/2015 0907   BUN 10 11/01/2014 1023   CREATININE 0.8 02/07/2015 0907   CREATININE 0.85 11/01/2014 1023   CALCIUM 10.4 02/07/2015 0907   CALCIUM 9.9 11/01/2014 1023   PROT 6.1* 02/07/2015 0907   ALBUMIN 4.0 02/07/2015 0907   AST 13 02/07/2015 0907   ALT 22 02/07/2015 0907   ALKPHOS 71 02/07/2015 0907   BILITOT 0.48 02/07/2015 0907   GFRNONAA >60 11/01/2014 1023   GFRAA >60 11/01/2014 1023    INo results found for: SPEP, UPEP  Lab Results  Component Value Date   WBC 3.9 02/07/2015   NEUTROABS 2.9 02/07/2015   HGB 11.6 02/07/2015   HCT 34.5* 02/07/2015   MCV 100.6 02/07/2015   PLT 176 02/07/2015      Chemistry      Component Value Date/Time   NA 141 02/07/2015 0907   NA 140 11/01/2014 1023   K 3.9 02/07/2015 0907   K 4.2 11/01/2014 1023   CL 104 11/01/2014 1023   CO2 24  02/07/2015 0907   CO2 26 11/01/2014 1023   BUN 10.9 02/07/2015 0907   BUN 10 11/01/2014 1023   CREATININE 0.8 02/07/2015 0907   CREATININE 0.85 11/01/2014 1023      Component Value Date/Time   CALCIUM 10.4 02/07/2015 0907   CALCIUM 9.9 11/01/2014 1023   ALKPHOS 71 02/07/2015 0907   AST 13 02/07/2015 0907   ALT 22 02/07/2015 0907   BILITOT 0.48 02/07/2015 4854  No results found for: LABCA2  No components found for: QJJHE174  No results for input(s): INR in the last 168 hours.  Urinalysis    Component Value Date/Time   LABSPEC 1.020 12/21/2014 1405   PHURINE 5.0 12/21/2014 1405   GLUCOSEU Negative 12/21/2014 1405   HGBUR Negative 12/21/2014 1405   BILIRUBINUR Negative 12/21/2014 1405   KETONESUR Negative 12/21/2014 1405   PROTEINUR < 30 12/21/2014 1405   UROBILINOGEN 0.2 12/21/2014 1405   NITRITE Negative 12/21/2014 1405   LEUKOCYTESUR Small 12/21/2014 1405    STUDIES: No results found.  ASSESSMENT: 69 y.o. [place] woman status post right breast biopsy 10/16/2014 for a clinical T3 N0, stage IIA invasive ductal carcinoma, grade 2 or 3, estrogen and progesterone receptor positive, with an MIB-1 of 20%, and HER-2 amplified, with a signals ratio of 3.60  (1) chemotherapy with anti-HER-2 immunotherapy to start 11/05/2014. This will consist of carboplatin, docetaxel, trastuzumab and pertuzumab given every 21 days 6, with Neulasta support  (2) trastuzumab will be continued to complete 1 year  (a) echocardiogram 008/04/2015 shows an ejection fraction of 55%  (3) definitive surgery to follow chemotherapy  (4) adjuvant radiation to follow surgery as appropriate  (5) anti-estrogens to follow completion of local treatments.  PLAN: Towanda looks and feels well today. The labs were reviewed in detail and were entirely stable. She will proceed with cycle 5 of carboplatin, docetaxel, trastuzumab, and pertuzumab as planned today. She has a repeat echocardiogram 2 weeks  ago that showed a well preserved ejection fraction.   Diana Dunn will return in 1 week for labs and a nadir visit.  She understands and agrees with this plan. She knows the goal of treatment in her case is cure. She has been encouraged to call with any issues that might arise before her next visit here.  Diana Panda, NP   02/07/2015 9:50 AM

## 2015-02-07 NOTE — Patient Instructions (Signed)
[facility] Discharge Instructions for Patients Receiving Chemotherapy  Today you received the following chemotherapy agents Herceptin/Perjeta/Taxotere/Carboplatin  To help prevent nausea and vomiting after your treatment, we encourage you to take your nausea medication as prescribed.  If you develop nausea and vomiting that is not controlled by your nausea medication, call the clinic.   BELOW ARE SYMPTOMS THAT SHOULD BE REPORTED IMMEDIATELY:  *FEVER GREATER THAN 100.5 F  *CHILLS WITH OR WITHOUT FEVER  NAUSEA AND VOMITING THAT IS NOT CONTROLLED WITH YOUR NAUSEA MEDICATION  *UNUSUAL SHORTNESS OF BREATH  *UNUSUAL BRUISING OR BLEEDING  TENDERNESS IN MOUTH AND THROAT WITH OR WITHOUT PRESENCE OF ULCERS  *URINARY PROBLEMS  *BOWEL PROBLEMS  UNUSUAL RASH Items with * indicate a potential emergency and should be followed up as soon as possible.  Feel free to call the clinic you have any questions or concerns. The clinic phone number is [phone].  Please show the CHEMO ALERT CARD at check-in to the Emergency Department and triage nurse.   

## 2015-02-09 ENCOUNTER — Ambulatory Visit (HOSPITAL_BASED_OUTPATIENT_CLINIC_OR_DEPARTMENT_OTHER): Payer: Medicare Other

## 2015-02-09 VITALS — BP 120/82 | HR 67 | Temp 99.9°F | Resp 17

## 2015-02-09 DIAGNOSIS — C50411 Malignant neoplasm of upper-outer quadrant of right female breast: Secondary | ICD-10-CM

## 2015-02-09 DIAGNOSIS — C50811 Malignant neoplasm of overlapping sites of right female breast: Secondary | ICD-10-CM

## 2015-02-09 DIAGNOSIS — Z5189 Encounter for other specified aftercare: Secondary | ICD-10-CM

## 2015-02-09 MED ORDER — PEGFILGRASTIM INJECTION 6 MG/0.6ML ~~LOC~~
6.0000 mg | PREFILLED_SYRINGE | Freq: Once | SUBCUTANEOUS | Status: AC
  Administered 2015-02-09: 6 mg via SUBCUTANEOUS

## 2015-02-14 ENCOUNTER — Encounter: Payer: Self-pay | Admitting: Nurse Practitioner

## 2015-02-14 ENCOUNTER — Ambulatory Visit (HOSPITAL_BASED_OUTPATIENT_CLINIC_OR_DEPARTMENT_OTHER): Payer: Medicare Other | Admitting: Nurse Practitioner

## 2015-02-14 ENCOUNTER — Other Ambulatory Visit (HOSPITAL_BASED_OUTPATIENT_CLINIC_OR_DEPARTMENT_OTHER): Payer: Medicare Other

## 2015-02-14 VITALS — BP 127/70 | HR 64 | Temp 97.9°F | Resp 98 | Ht <= 58 in | Wt 112.6 lb

## 2015-02-14 DIAGNOSIS — B370 Candidal stomatitis: Secondary | ICD-10-CM | POA: Diagnosis not present

## 2015-02-14 DIAGNOSIS — C50811 Malignant neoplasm of overlapping sites of right female breast: Secondary | ICD-10-CM

## 2015-02-14 DIAGNOSIS — Z170 Estrogen receptor positive status [ER+]: Secondary | ICD-10-CM

## 2015-02-14 DIAGNOSIS — C50411 Malignant neoplasm of upper-outer quadrant of right female breast: Secondary | ICD-10-CM

## 2015-02-14 DIAGNOSIS — B3781 Candidal esophagitis: Secondary | ICD-10-CM

## 2015-02-14 LAB — COMPREHENSIVE METABOLIC PANEL (CC13)
ALK PHOS: 76 U/L (ref 40–150)
ALT: 24 U/L (ref 0–55)
AST: 13 U/L (ref 5–34)
Albumin: 4 g/dL (ref 3.5–5.0)
Anion Gap: 8 mEq/L (ref 3–11)
BILIRUBIN TOTAL: 0.79 mg/dL (ref 0.20–1.20)
BUN: 8.6 mg/dL (ref 7.0–26.0)
CO2: 27 meq/L (ref 22–29)
Calcium: 9.9 mg/dL (ref 8.4–10.4)
Chloride: 104 mEq/L (ref 98–109)
Creatinine: 0.7 mg/dL (ref 0.6–1.1)
EGFR: 87 mL/min/{1.73_m2} — ABNORMAL LOW (ref >90)
GLUCOSE: 82 mg/dL (ref 70–140)
Potassium: 4 mEq/L (ref 3.5–5.1)
SODIUM: 139 meq/L (ref 136–145)
TOTAL PROTEIN: 6.2 g/dL — AB (ref 6.4–8.3)

## 2015-02-14 LAB — CBC WITH DIFFERENTIAL/PLATELET
BASO%: 0 % (ref 0.0–2.0)
Basophils Absolute: 0 10*3/uL (ref 0.0–0.1)
EOS ABS: 0 10*3/uL (ref 0.0–0.5)
EOS%: 0 % (ref 0.0–7.0)
HCT: 35.1 % (ref 34.8–46.6)
HEMOGLOBIN: 11.8 g/dL (ref 11.6–15.9)
LYMPH%: 42.1 % (ref 14.0–49.7)
MCH: 34.1 pg — ABNORMAL HIGH (ref 25.1–34.0)
MCHC: 33.6 g/dL (ref 31.5–36.0)
MCV: 101.4 fL — AB (ref 79.5–101.0)
MONO#: 0.7 10*3/uL (ref 0.1–0.9)
MONO%: 26.6 % — AB (ref 0.0–14.0)
NEUT#: 0.9 10*3/uL — ABNORMAL LOW (ref 1.5–6.5)
NEUT%: 31.3 % — ABNORMAL LOW (ref 38.4–76.8)
Platelets: 127 10*3/uL — ABNORMAL LOW (ref 145–400)
RBC: 3.46 10*6/uL — ABNORMAL LOW (ref 3.70–5.45)
RDW: 13.8 % (ref 11.2–14.5)
WBC: 2.7 10*3/uL — AB (ref 3.9–10.3)
lymph#: 1.1 10*3/uL (ref 0.9–3.3)

## 2015-02-14 NOTE — Progress Notes (Signed)
[place]  [phone] [phone]) [phone]     ID: Diana Dunn DOB: December 05, 1945  MR#: [id number]  XBM#:[id number]  Patient Care Team: Diana Battiest, MD as PCP - General (Obstetrics and Gynecology) Autumn Messing III, MD as Consulting Physician (General Surgery) Chauncey Cruel, MD as Consulting Physician (Oncology) Eppie Gibson, MD as Attending Physician (Radiation Oncology) Mauro Kaufmann, RN as Registered Nurse Rockwell Germany, RN as Registered Nurse Holley Bouche, NP as Nurse Practitioner (Nurse Practitioner) Lona Kettle, MD (Family Medicine) PCP: Diana Battiest, MD OTHER MD:  CHIEF COMPLAINT: Triple positive breast cancer  CURRENT TREATMENT: Chemotherapy and anti-HER-2 immunotherapy  BREAST CANCER HISTORY: From the original intake note:  Diana Dunn had not had a mammogram for approximately 4 years. Sometime mid 2015 she had an area of redness and tenderness in the right breast and she brought this to her physician's attention. She was treated with antibiotics and this completely cleared.Marland Kitchen  Approximately mid April, she again developed redness over the right breast. This was very focal, and it did not look "as red" as the previous time. She brought it to Dr. Harrington Challenger is attention, and was treated with antibiotics. Bilateral mammography with tomosynthesis and right breast ultrasonography was also obtained on 09/19/2014. There was a focal opacity with irregular margins in the upper outer right breast with some central calcifications. There was mild architectural distortion associated with this. On exam Dr. [name] noted a firm palpable tender mass lateral to the right areola with overlying erythema. On ultrasonography there was an irregular hypoechoic mass measuring 2.4 cm.  Repeat right breast ultrasonography after the patient completed her antibiotic course was performed 10/04/2014. On exam there was still a palpable lobulated mass lateral to the right nipple and  by ultrasound this measured 2.6 cm. It was felt to be a possible abscess and a second course of antibiotics, now with Septra, was undertaken. After that treatment, repeat ultrasonography 10/16/2014 continued to show the palpable mass and ultrasonography now found the mass to measure 2.9 cm maximally.  Accordingly biopsy of the mass in question was obtained that same day, 10/16/2014, and showed ([id number]) and invasive ductal carcinoma, grade 2 or 3, estrogen receptor 90% positive, progesterone receptor 80% positive, both with strong staining intensity, with an MIB-1 of 20%, and with HER-2 amplification, the signals ratio being 3.60 and the number per cell 4.50.  Bilateral breast MRI 10/23/2014 showed a breast density to be category B. In the right breast there was an irregular enhancing mass at the 9:30 o'clock position measuring 3.1 cm. There were no additional worrisome masses in the right breast, no findings of concern in the left breast, and no abnormal appearing lymph nodes.  The patient's subsequent history is as detailed below  INTERVAL HISTORY: Diana Dunn returns today for follow-up of her HER-2 positive breast cancer. Today is day 8, cycle 5 of 6 planned cycles of carboplatin, docetaxel, trastuzumab and pertuzumab, with neulasta for granulocyte support.   REVIEW OF SYSTEMS: Diana Dunn feels "yucky" today. She has had more fatigue this cycle. She denies fevers or chills. Her mild nausea is managed with antiemetics. She has been using miralax daily to move her bowels. She complains that her taste has been more off than usual. She is using fluconazole daily to clear her thrush. Her fingertips are sensitive again. She had to wear gloves to floss, but denies numbness or tingling. A detailed review of systems is otherwise stable.  PAST MEDICAL HISTORY: Past Medical History  Diagnosis Date  .  Breast cancer of upper-outer quadrant of right female breast 10/19/2014  . Breast cancer   . History of hiatal  hernia   . IBS (irritable bowel syndrome)   . Lactose intolerance   . Cancer     RIGHT BREAST CANCER  Irritable bowel symptoms  PAST SURGICAL HISTORY: Past Surgical History  Procedure Laterality Date  . Breast surgery      BIOPSY OF BREAST  . Colonoscopy    . Portacath placement Left 11/02/2014    Procedure: INSERTION PORT-A-CATH;  Surgeon: Autumn Messing III, MD;  Location: [facility];  Service: General;  Laterality: Left;    FAMILY HISTORY No family history on file. The patient's father died at the age of 72 from a myocardial infarction. The patient's mother died at the age of 51 following a stroke. The patient had 2 brothers. One had Down's syndrome. She had no sisters. The only breast cancer in the family was the patient's father's only sister was diagnosed with breast cancer in her 11s. There is no history of ovarian cancer in the family to the patient's knowledge   GYNECOLOGIC HISTORY:  No LMP recorded. Patient is postmenopausal.  menarche age 69, first live birth age 77, she is [place] P2. She went through the change of life in late 1999. She did not take hormone replacement. She took birth control pills for less than 2 years in her 16X, with no complications   SOCIAL HISTORY:  Diana Dunn is my neighbor. 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 Bill died from widely metastatic malignant melanoma within 4 months of diagnosis some years ago. The patient lives with her son [name] and his wife, who is expecting their first child late November 2016. The other son, [name], lives in [place]. Both sons are appraised her's. The patient has 2 grandchildren and as just noted one on the way. The patient attends the [employer]: Not in place. At the 10/24/2014 visit the patient was given the appropriate documents 2 complete and notarize at her discretion   HEALTH MAINTENANCE: Social History  Substance Use Topics  . Smoking  status: Former Smoker    Types: Cigarettes    Quit date: 08/03/2014  . Smokeless tobacco: Not on file  . Alcohol Use: 3.6 oz/week    6 Glasses of wine per week     Colonoscopy: 2000?  PAP:  Bone density: On wrist, remote   Lipid panel:  Allergies  Allergen Reactions  . Aspirin     Stomach pain  . Aspirin Nausea Only    GI upset/pain    Current Outpatient Prescriptions  Medication Sig Dispense Refill  . dexamethasone (DECADRON) 4 MG tablet Take two tablets (8 mg) twice a day w food the day before chemo, then the day after chemo and the day after that; last dose the morning of the 4th day counting from chemo as day 1 40 tablet 1  . fluconazole (DIFLUCAN) 100 MG tablet Take one tablet daily starting day four from chemo and continuing for four days or until thrush resolves 30 tablet 1  . lidocaine-prilocaine (EMLA) cream Apply 1 application topically as needed. 30 g 0  . LORazepam (ATIVAN) 0.5 MG tablet Take 1 tablet (0.5 mg total) by mouth at bedtime. 30 tablet 0  . ondansetron (ZOFRAN) 8 MG tablet Take one tablet twice a day starting the evening of chemo and continuing through the evening of day  3 40 tablet 1  . tobramycin-dexamethasone (TOBRADEX) ophthalmic solution Place 1 drop into both eyes every 4 (four) hours while awake. 5 mL 0  . albuterol (PROVENTIL HFA;VENTOLIN HFA) 108 (90 BASE) MCG/ACT inhaler Inhale 1-2 puffs into the lungs every 6 (six) hours as needed for wheezing or shortness of breath. (Patient not taking: Reported on 01/16/2015) 1 Inhaler 2  . hyoscyamine (ANASPAZ) 0.125 MG TBDP disintergrating tablet Place 1 tablet (0.125 mg total) under the tongue every 6 (six) hours as needed for cramping. (Patient not taking: Reported on 01/23/2015) 10 tablet 0  . prochlorperazine (COMPAZINE) 10 MG tablet Take 1 tablet (10 mg total) by mouth every 6 (six) hours as needed for nausea or vomiting. (Patient not taking: Reported on 01/03/2015) 30 tablet 0   No current facility-administered  medications for this visit.    OBJECTIVE: middle-aged white woman in no acute distress Filed Vitals:   02/14/15 0913  BP: 127/70  Pulse: 64  Temp: 97.9 F (36.6 C)  Resp: 98     Body mass index is 24.8 kg/(m^2).    ECOG FS:1 - Symptomatic but completely ambulatory  Sclerae unicteric, pupils round and equal Oropharynx clear and moist-- no thrush or other lesions No cervical or supraclavicular adenopathy Lungs no rales or rhonchi Heart regular rate and rhythm Abd soft, nontender, positive bowel sounds MSK no focal spinal tenderness, no upper extremity lymphedema Neuro: nonfocal, well oriented, appropriate affect Breasts: deferred  LAB RESULTS:  CMP     Component Value Date/Time   NA 139 02/14/2015 0900   NA 140 11/01/2014 1023   K 4.0 02/14/2015 0900   K 4.2 11/01/2014 1023   CL 104 11/01/2014 1023   CO2 27 02/14/2015 0900   CO2 26 11/01/2014 1023   GLUCOSE 82 02/14/2015 0900   GLUCOSE 93 11/01/2014 1023   BUN 8.6 02/14/2015 0900   BUN 10 11/01/2014 1023   CREATININE 0.7 02/14/2015 0900   CREATININE 0.85 11/01/2014 1023   CALCIUM 9.9 02/14/2015 0900   CALCIUM 9.9 11/01/2014 1023   PROT 6.2* 02/14/2015 0900   ALBUMIN 4.0 02/14/2015 0900   AST 13 02/14/2015 0900   ALT 24 02/14/2015 0900   ALKPHOS 76 02/14/2015 0900   BILITOT 0.79 02/14/2015 0900   GFRNONAA >60 11/01/2014 1023   GFRAA >60 11/01/2014 1023    INo results found for: SPEP, UPEP  Lab Results  Component Value Date   WBC 2.7* 02/14/2015   NEUTROABS 0.9* 02/14/2015   HGB 11.8 02/14/2015   HCT 35.1 02/14/2015   MCV 101.4* 02/14/2015   PLT 127* 02/14/2015      Chemistry      Component Value Date/Time   NA 139 02/14/2015 0900   NA 140 11/01/2014 1023   K 4.0 02/14/2015 0900   K 4.2 11/01/2014 1023   CL 104 11/01/2014 1023   CO2 27 02/14/2015 0900   CO2 26 11/01/2014 1023   BUN 8.6 02/14/2015 0900   BUN 10 11/01/2014 1023   CREATININE 0.7 02/14/2015 0900   CREATININE 0.85 11/01/2014 1023        Component Value Date/Time   CALCIUM 9.9 02/14/2015 0900   CALCIUM 9.9 11/01/2014 1023   ALKPHOS 76 02/14/2015 0900   AST 13 02/14/2015 0900   ALT 24 02/14/2015 0900   BILITOT 0.79 02/14/2015 0900       No results found for: LABCA2  No components found for: LABCA125  No results for input(s): INR in the last 168 hours.  Urinalysis  Component Value Date/Time   LABSPEC 1.020 12/21/2014 1405   PHURINE 5.0 12/21/2014 1405   GLUCOSEU Negative 12/21/2014 1405   HGBUR Negative 12/21/2014 1405   BILIRUBINUR Negative 12/21/2014 1405   KETONESUR Negative 12/21/2014 1405   PROTEINUR < 30 12/21/2014 1405   UROBILINOGEN 0.2 12/21/2014 1405   NITRITE Negative 12/21/2014 1405   LEUKOCYTESUR Small 12/21/2014 1405    STUDIES: No results found.  ASSESSMENT: 69 y.o. [place] woman status post right breast biopsy 10/16/2014 for a clinical T3 N0, stage IIA invasive ductal carcinoma, grade 2 or 3, estrogen and progesterone receptor positive, with an MIB-1 of 20%, and HER-2 amplified, with a signals ratio of 3.60  (1) chemotherapy with anti-HER-2 immunotherapy to start 11/05/2014. This will consist of carboplatin, docetaxel, trastuzumab and pertuzumab given every 21 days 6, with Neulasta support  (2) trastuzumab will be continued to complete 1 year  (a) echocardiogram 008/04/2015 shows an ejection fraction of 55%  (3) definitive surgery to follow chemotherapy  (4) adjuvant radiation to follow surgery as appropriate  (5) anti-estrogens to follow completion of local treatments.  PLAN: Diana Dunn is having a rougher time with chemo this cycle. The labs were reviewed in detail and were stable however. She will continue her fluconazole until her thrush clears.   She meets with Dr. [name] next week. I am unsure if this is her official consult visit for surgery as she has not had an MRI yet, but she will report back with details after it is over.  Diana Dunn will return in 2 weeks for her  6th and final cycle of carboplatin, docetaxel, trastuzumab and pertuzumab.  She understands and agrees with this plan. She knows the goal of treatment in her case is cure. She has been encouraged to call with any issues that might arise before her next visit here.  Laurie Panda, NP   02/14/2015 9:52 AM

## 2015-02-19 DIAGNOSIS — C50411 Malignant neoplasm of upper-outer quadrant of right female breast: Secondary | ICD-10-CM | POA: Diagnosis not present

## 2015-02-28 ENCOUNTER — Ambulatory Visit (HOSPITAL_BASED_OUTPATIENT_CLINIC_OR_DEPARTMENT_OTHER): Payer: Medicare Other

## 2015-02-28 ENCOUNTER — Other Ambulatory Visit (HOSPITAL_BASED_OUTPATIENT_CLINIC_OR_DEPARTMENT_OTHER): Payer: Medicare Other

## 2015-02-28 ENCOUNTER — Other Ambulatory Visit: Payer: Self-pay | Admitting: Oncology

## 2015-02-28 ENCOUNTER — Ambulatory Visit (HOSPITAL_BASED_OUTPATIENT_CLINIC_OR_DEPARTMENT_OTHER): Payer: Medicare Other | Admitting: Nurse Practitioner

## 2015-02-28 ENCOUNTER — Encounter: Payer: Self-pay | Admitting: Nurse Practitioner

## 2015-02-28 ENCOUNTER — Encounter: Payer: Self-pay | Admitting: *Deleted

## 2015-02-28 ENCOUNTER — Other Ambulatory Visit: Payer: Self-pay | Admitting: *Deleted

## 2015-02-28 VITALS — BP 126/66 | HR 72 | Temp 97.7°F | Resp 18 | Ht <= 58 in | Wt 114.0 lb

## 2015-02-28 DIAGNOSIS — Z5112 Encounter for antineoplastic immunotherapy: Secondary | ICD-10-CM

## 2015-02-28 DIAGNOSIS — C50411 Malignant neoplasm of upper-outer quadrant of right female breast: Secondary | ICD-10-CM

## 2015-02-28 DIAGNOSIS — Z5111 Encounter for antineoplastic chemotherapy: Secondary | ICD-10-CM

## 2015-02-28 DIAGNOSIS — C50811 Malignant neoplasm of overlapping sites of right female breast: Secondary | ICD-10-CM

## 2015-02-28 LAB — CBC WITH DIFFERENTIAL/PLATELET
BASO%: 0 % (ref 0.0–2.0)
BASOS ABS: 0 10*3/uL (ref 0.0–0.1)
EOS ABS: 0 10*3/uL (ref 0.0–0.5)
EOS%: 0 % (ref 0.0–7.0)
HCT: 34.8 % (ref 34.8–46.6)
HGB: 11.7 g/dL (ref 11.6–15.9)
LYMPH%: 14.3 % (ref 14.0–49.7)
MCH: 34.3 pg — AB (ref 25.1–34.0)
MCHC: 33.6 g/dL (ref 31.5–36.0)
MCV: 102.1 fL — AB (ref 79.5–101.0)
MONO#: 0.5 10*3/uL (ref 0.1–0.9)
MONO%: 9.6 % (ref 0.0–14.0)
NEUT#: 3.9 10*3/uL (ref 1.5–6.5)
NEUT%: 76.1 % (ref 38.4–76.8)
PLATELETS: 190 10*3/uL (ref 145–400)
RBC: 3.41 10*6/uL — AB (ref 3.70–5.45)
RDW: 13.9 % (ref 11.2–14.5)
WBC: 5.1 10*3/uL (ref 3.9–10.3)
lymph#: 0.7 10*3/uL — ABNORMAL LOW (ref 0.9–3.3)

## 2015-02-28 LAB — COMPREHENSIVE METABOLIC PANEL (CC13)
ALT: 14 U/L (ref 0–55)
ANION GAP: 8 meq/L (ref 3–11)
AST: 12 U/L (ref 5–34)
Albumin: 4.1 g/dL (ref 3.5–5.0)
Alkaline Phosphatase: 66 U/L (ref 40–150)
BUN: 15.3 mg/dL (ref 7.0–26.0)
CHLORIDE: 107 meq/L (ref 98–109)
CO2: 26 meq/L (ref 22–29)
Calcium: 10.4 mg/dL (ref 8.4–10.4)
Creatinine: 0.7 mg/dL (ref 0.6–1.1)
EGFR: 85 mL/min/{1.73_m2} — AB (ref >90)
GLUCOSE: 132 mg/dL (ref 70–140)
POTASSIUM: 3.9 meq/L (ref 3.5–5.1)
SODIUM: 141 meq/L (ref 136–145)
Total Bilirubin: 0.41 mg/dL (ref 0.20–1.20)
Total Protein: 6.3 g/dL — ABNORMAL LOW (ref 6.4–8.3)

## 2015-02-28 MED ORDER — SODIUM CHLORIDE 0.9 % IV SOLN
360.0000 mg | Freq: Once | INTRAVENOUS | Status: AC
  Administered 2015-02-28: 360 mg via INTRAVENOUS
  Filled 2015-02-28: qty 36

## 2015-02-28 MED ORDER — ACETAMINOPHEN 325 MG PO TABS
ORAL_TABLET | ORAL | Status: AC
Start: 2015-02-28 — End: 2015-02-28
  Filled 2015-02-28: qty 2

## 2015-02-28 MED ORDER — SODIUM CHLORIDE 0.9 % IV SOLN
Freq: Once | INTRAVENOUS | Status: AC
  Administered 2015-02-28: 10:00:00 via INTRAVENOUS

## 2015-02-28 MED ORDER — SODIUM CHLORIDE 0.9 % IV SOLN
420.0000 mg | Freq: Once | INTRAVENOUS | Status: AC
  Administered 2015-02-28: 420 mg via INTRAVENOUS
  Filled 2015-02-28: qty 14

## 2015-02-28 MED ORDER — TRASTUZUMAB CHEMO INJECTION 440 MG
6.0000 mg/kg | Freq: Once | INTRAVENOUS | Status: AC
  Administered 2015-02-28: 357 mg via INTRAVENOUS
  Filled 2015-02-28: qty 17

## 2015-02-28 MED ORDER — DIPHENHYDRAMINE HCL 25 MG PO CAPS
25.0000 mg | ORAL_CAPSULE | Freq: Once | ORAL | Status: AC
  Administered 2015-02-28: 25 mg via ORAL

## 2015-02-28 MED ORDER — DOCETAXEL CHEMO INJECTION 160 MG/16ML
115.0000 mg | Freq: Once | INTRAVENOUS | Status: AC
  Administered 2015-02-28: 115 mg via INTRAVENOUS
  Filled 2015-02-28: qty 11.5

## 2015-02-28 MED ORDER — HEPARIN SOD (PORK) LOCK FLUSH 100 UNIT/ML IV SOLN
500.0000 [IU] | Freq: Once | INTRAVENOUS | Status: AC | PRN
  Administered 2015-02-28: 500 [IU]
  Filled 2015-02-28: qty 5

## 2015-02-28 MED ORDER — SODIUM CHLORIDE 0.9 % IJ SOLN
10.0000 mL | INTRAMUSCULAR | Status: DC | PRN
  Administered 2015-02-28: 10 mL
  Filled 2015-02-28: qty 10

## 2015-02-28 MED ORDER — DIPHENHYDRAMINE HCL 25 MG PO CAPS
ORAL_CAPSULE | ORAL | Status: AC
  Filled 2015-02-28: qty 1

## 2015-02-28 MED ORDER — ACETAMINOPHEN 325 MG PO TABS
650.0000 mg | ORAL_TABLET | Freq: Once | ORAL | Status: AC
  Administered 2015-02-28: 650 mg via ORAL

## 2015-02-28 MED ORDER — SODIUM CHLORIDE 0.9 % IV SOLN
Freq: Once | INTRAVENOUS | Status: AC
  Administered 2015-02-28: 12:00:00 via INTRAVENOUS
  Filled 2015-02-28: qty 8

## 2015-02-28 MED ORDER — DEXAMETHASONE 4 MG PO TABS: ORAL_TABLET | ORAL | Status: DC

## 2015-02-28 NOTE — Progress Notes (Signed)
[place]  [phone] [phone]     ID: Diana Dunn DOB: 04/19/1946  MR#: [id number]  MSX#:[id number]  Patient Care Team: Diana Battiest, MD as PCP - General (Obstetrics and Gynecology) Diana Messing III, MD as Consulting Physician (General Surgery) Diana Cruel, MD as Consulting Physician (Oncology) Diana Gibson, MD as Attending Physician (Radiation Oncology) Diana Kaufmann, RN as Registered Nurse Diana Germany, RN as Registered Nurse Diana Bouche, NP as Nurse Practitioner (Nurse Practitioner) Diana Kettle, MD (Family Medicine) PCP: Diana Battiest, MD OTHER MD:  CHIEF COMPLAINT: Triple positive breast cancer  CURRENT TREATMENT: Chemotherapy and anti-HER-2 immunotherapy  BREAST CANCER HISTORY: From the original intake note:  Diana Dunn had not had a mammogram for approximately 4 years. Sometime mid 2015 she had an area of redness and tenderness in the right breast and she brought this to her physician's attention. She was treated with antibiotics and this completely cleared.Marland Kitchen  Approximately mid April, she again developed redness over the right breast. This was very focal, and it did not look "as red" as the previous time. She brought it to Diana Dunn Dunn attention, and was treated with antibiotics. Bilateral mammography with tomosynthesis and right breast ultrasonography was also obtained on 09/19/2014. There was a focal opacity with irregular margins in the upper outer right breast with some central calcifications. There was mild architectural distortion associated with this. On exam Dr. Autumn Dunn noted a firm palpable tender mass lateral to the right areola with overlying erythema. On ultrasonography there was an irregular hypoechoic mass measuring 2.4 cm.  Repeat right breast ultrasonography after the patient completed her antibiotic course was performed 10/04/2014. On exam there was still a palpable lobulated mass lateral to the right nipple and  by ultrasound this measured 2.6 cm. It was felt to be a possible abscess and a second course of antibiotics, now with Septra, was undertaken. After that treatment, repeat ultrasonography 10/16/2014 continued to show the palpable mass and ultrasonography now found the mass to measure 2.9 cm maximally.  Accordingly biopsy of the mass in question was obtained that same day, 10/16/2014, and showed ([id number]) and invasive ductal carcinoma, grade 2 or 3, estrogen receptor 90% positive, progesterone receptor 80% positive, both with strong staining intensity, with an Diana Dunn of 20%, and with HER-2 amplification, the signals ratio being 3.60 and the number per cell 4.50.  Bilateral breast MRI 10/23/2014 showed a breast density to be category B. In the right breast there was an irregular enhancing mass at the 9:30 o'clock position measuring 3.1 cm. There were no additional worrisome masses in the right breast, no findings of concern in the left breast, and no abnormal appearing lymph nodes.  The patient's subsequent history Dunn as detailed below  INTERVAL HISTORY: Diana Dunn returns today for follow-up of her HER-2 positive breast cancer. Today Dunn day 1, cycle 6 of 6 planned cycles of carboplatin, docetaxel, trastuzumab and pertuzumab, with neulasta for granulocyte support.   REVIEW OF SYSTEMS: Diana Dunn Dunn doing well today. In her off week she has recovered so much. She denies fevers, chills ,nausea, or vomiting. She takes miralax PRN to stay regular. Her appetite Dunn back up despite taste changes. Her fluconazole has cleared her thrush. She denies mouth sores or rashes. The sensitivity to her fingertips has improved. A detailed review of systems Dunn otherwise stable.  PAST MEDICAL HISTORY: Past Medical History  Diagnosis Date  . Breast cancer of upper-outer quadrant of right female breast 10/19/2014  . Breast  cancer   . History of hiatal hernia   . IBS (irritable bowel Dunn)   . Lactose intolerance   .  Cancer     RIGHT BREAST CANCER  Irritable bowel symptoms  PAST SURGICAL HISTORY: Past Surgical History  Procedure Laterality Date  . Breast surgery      BIOPSY OF BREAST  . Colonoscopy    . Portacath placement Left 11/02/2014    Procedure: INSERTION PORT-A-CATH;  Surgeon: Diana Messing III, MD;  Location: [facility];  Service: General;  Laterality: Left;    FAMILY HISTORY No family history on file. The patient's father died at the age of 78 from a myocardial infarction. The patient's mother died at the age of 29 following a stroke. The patient had 2 brothers. One had Diana Dunn. She had no sisters. The only breast cancer in the family was the patient's father's only sister was diagnosed with breast cancer in her 46s. There Dunn no history of ovarian cancer in the family to the patient's knowledge   GYNECOLOGIC HISTORY:  No LMP recorded. Patient Dunn postmenopausal.  menarche age 26, first live birth age 50, she Dunn Jackson P2. She went through the change of life in late 1999. She did not take hormone replacement. 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 Diana Dunn died from widely metastatic malignant melanoma within 4 months of diagnosis some years ago. The patient lives with her [name] and his wife, who Dunn expecting their first child late November 2016. The other son, Diana Dunn, lives in [place]. Both sons are appraised her's. The patient has 2 grandchildren and as just noted one on the way. The patient attends the [place]: Not in place. At the 10/24/2014 visit the patient was given the appropriate documents 2 complete and notarize at her discretion   HEALTH MAINTENANCE: Social History  Substance Use Topics  . Smoking status: Former Smoker    Types: Cigarettes    Quit date: 08/03/2014  .  Smokeless tobacco: Not on file  . Alcohol Use: 3.6 oz/week    6 Glasses of wine per week     Colonoscopy: 2000?  PAP:  Bone density: On wrist, remote   Lipid panel:  Allergies  Allergen Reactions  . Aspirin     Stomach pain  . Aspirin Nausea Only    GI upset/pain    Current Outpatient Prescriptions  Medication Sig Dispense Refill  . dexamethasone (DECADRON) 4 MG tablet Take two tablets (8 mg) twice a day w food the day before chemo, then the day after chemo and the day after that; last dose the morning of the 4th day counting from chemo as day 1 40 tablet 1  . fluconazole (DIFLUCAN) 100 MG tablet Take one tablet daily starting day four from chemo and continuing for four days or until thrush resolves 30 tablet 1  . lidocaine-prilocaine (EMLA) cream Apply 1 application topically as needed. 30 g 0  . ondansetron (ZOFRAN) 8 MG tablet Take one tablet twice a day starting the evening of chemo and continuing through the evening of day 3 40 tablet 1  . tobramycin-dexamethasone (TOBRADEX) ophthalmic solution Place 1 drop into both eyes every 4 (four) hours while awake. 5 mL 0  . albuterol (PROVENTIL HFA;VENTOLIN HFA) 108 (90  BASE) MCG/ACT inhaler Inhale 1-2 puffs into the lungs every 6 (six) hours as needed for wheezing or shortness of breath. (Patient not taking: Reported on 01/16/2015) 1 Inhaler 2  . hyoscyamine (ANASPAZ) 0.125 MG TBDP disintergrating tablet Place 1 tablet (0.125 mg total) under the tongue every 6 (six) hours as needed for cramping. (Patient not taking: Reported on 01/23/2015) 10 tablet 0  . LORazepam (ATIVAN) 0.5 MG tablet Take 1 tablet (0.5 mg total) by mouth at bedtime. (Patient not taking: Reported on 02/28/2015) 30 tablet 0  . prochlorperazine (COMPAZINE) 10 MG tablet Take 1 tablet (10 mg total) by mouth every 6 (six) hours as needed for nausea or vomiting. (Patient not taking: Reported on 01/03/2015) 30 tablet 0   No current facility-administered medications for this visit.     Facility-Administered Medications Ordered in Other Visits  Medication Dose Route Frequency Provider Last Rate Last Dose  . CARBOplatin (PARAPLATIN) 390 mg in sodium chloride 0.9 % 100 mL chemo infusion  390 mg Intravenous Once Diana Cruel, MD      . DOCEtaxel (TAXOTERE) 130 mg in dextrose 5 % 250 mL chemo infusion  75 mg/m2 (Treatment Plan Actual) Intravenous Once Diana Cruel, MD      . heparin lock flush 100 unit/mL  500 Units Intracatheter Once PRN Diana Cruel, MD      . ondansetron (ZOFRAN) 16 mg, dexamethasone (DECADRON) 20 mg in sodium chloride 0.9 % 50 mL IVPB   Intravenous Once Diana Cruel, MD      . pertuzumab (PERJETA) 420 mg in sodium chloride 0.9 % 250 mL chemo infusion  420 mg Intravenous Once Diana Cruel, MD      . sodium chloride 0.9 % injection 10 mL  10 mL Intracatheter PRN Diana Cruel, MD      . trastuzumab (HERCEPTIN) 357 mg in sodium chloride 0.9 % 250 mL chemo infusion  6 mg/kg (Treatment Plan Actual) Intravenous Once Diana Cruel, MD        OBJECTIVE: middle-aged white woman in no acute distress Filed Vitals:   02/28/15 0939  BP: 126/66  Pulse: 72  Temp: 97.7 F (36.5 C)  Resp: 18     Body mass index Dunn 25.11 kg/(m^2).    ECOG FS:1 - Symptomatic but completely ambulatory  Skin: warm, dry  HEENT: sclerae anicteric, conjunctivae pink, oropharynx clear. No thrush or mucositis.  Lymph Nodes: No cervical or supraclavicular lymphadenopathy  Lungs: clear to auscultation bilaterally, no rales, wheezes, or rhonci  Heart: regular rate and rhythm  Abdomen: round, soft, non tender, positive bowel sounds  Musculoskeletal: No focal spinal tenderness, no peripheral edema  Neuro: non focal, well oriented, positive affect  Breasts: deferred  LAB RESULTS:  CMP     Component Value Date/Time   NA 141 02/28/2015 0842   NA 140 11/01/2014 1023   K 3.9 02/28/2015 0842   K 4.2 11/01/2014 1023   CL 104 11/01/2014 1023   CO2 26  02/28/2015 0842   CO2 26 11/01/2014 1023   GLUCOSE 132 02/28/2015 0842   GLUCOSE 93 11/01/2014 1023   BUN 15.3 02/28/2015 0842   BUN 10 11/01/2014 1023   CREATININE 0.7 02/28/2015 0842   CREATININE 0.85 11/01/2014 1023   CALCIUM 10.4 02/28/2015 0842   CALCIUM 9.9 11/01/2014 1023   PROT 6.3* 02/28/2015 0842   ALBUMIN 4.1 02/28/2015 0842   AST 12 02/28/2015 0842   ALT 14 02/28/2015 0842   ALKPHOS 66 02/28/2015 0842  BILITOT 0.41 02/28/2015 0842   GFRNONAA >60 11/01/2014 1023   GFRAA >60 11/01/2014 1023    INo results found for: SPEP, UPEP  Lab Results  Component Value Date   WBC 5.1 02/28/2015   NEUTROABS 3.9 02/28/2015   HGB 11.7 02/28/2015   HCT 34.8 02/28/2015   MCV 102.1* 02/28/2015   PLT 190 02/28/2015      Chemistry      Component Value Date/Time   NA 141 02/28/2015 0842   NA 140 11/01/2014 1023   K 3.9 02/28/2015 0842   K 4.2 11/01/2014 1023   CL 104 11/01/2014 1023   CO2 26 02/28/2015 0842   CO2 26 11/01/2014 1023   BUN 15.3 02/28/2015 0842   BUN 10 11/01/2014 1023   CREATININE 0.7 02/28/2015 0842   CREATININE 0.85 11/01/2014 1023      Component Value Date/Time   CALCIUM 10.4 02/28/2015 0842   CALCIUM 9.9 11/01/2014 1023   ALKPHOS 66 02/28/2015 0842   AST 12 02/28/2015 0842   ALT 14 02/28/2015 0842   BILITOT 0.41 02/28/2015 0842       No results found for: LABCA2  No components found for: LABCA125  No results for input(s): INR in the last 168 hours.  Urinalysis    Component Value Date/Time   LABSPEC 1.020 12/21/2014 1405   PHURINE 5.0 12/21/2014 1405   GLUCOSEU Negative 12/21/2014 1405   HGBUR Negative 12/21/2014 1405   BILIRUBINUR Negative 12/21/2014 1405   KETONESUR Negative 12/21/2014 1405   PROTEINUR < 30 12/21/2014 1405   UROBILINOGEN 0.2 12/21/2014 1405   NITRITE Negative 12/21/2014 1405   LEUKOCYTESUR Small 12/21/2014 1405    STUDIES: No results found.  ASSESSMENT: 69 y.o. [place] woman status post right breast biopsy  10/16/2014 for a clinical T3 N0, stage IIA invasive ductal carcinoma, grade 2 or 3, estrogen and progesterone receptor positive, with an Diana Dunn of 20%, and HER-2 amplified, with a signals ratio of 3.60  (1) chemotherapy with anti-HER-2 immunotherapy to start 11/05/2014. This will consist of carboplatin, docetaxel, trastuzumab and pertuzumab given every 21 days 6, with Neulasta support  (2) trastuzumab will be continued to complete 1 year  (a) echocardiogram 008/04/2015 shows an ejection fraction of 55%  (3) definitive surgery to follow chemotherapy  (4) adjuvant radiation to follow surgery as appropriate  (5) anti-estrogens to follow completion of local treatments.  PLAN: Livianna Dunn excited to be completing chemotherapy today. The labs were reviewed in detail and were stable. She will proceed with her 6th and final cycle of carboplatin, doecteaxel, trasuzumab, and pertuzumab.  She met with Dr. [name] last week, and she Dunn leaning towards a mastectomy.   Rondalyn will return in 2 weeks for a follow up visit with Dr. Jana Hakim. Prior to this visit she will have a final breast MRI.  She understands and agrees with this plan. She knows the goal of treatment in her case Dunn cure. She has been encouraged to call with any issues that might arise before her next visit here.  Laurie Panda, NP   02/28/2015 10:03 AM

## 2015-02-28 NOTE — Patient Instructions (Signed)
[facility] Discharge Instructions for Patients Receiving Chemotherapy  Today you received the following chemotherapy agents herceptin, perjeta, taxotere, carboplatin  To help prevent nausea and vomiting after your treatment, we encourage you to take your nausea medication    If you develop nausea and vomiting that is not controlled by your nausea medication, call the clinic.   BELOW ARE SYMPTOMS THAT SHOULD BE REPORTED IMMEDIATELY:  *FEVER GREATER THAN 100.5 F  *CHILLS WITH OR WITHOUT FEVER  NAUSEA AND VOMITING THAT IS NOT CONTROLLED WITH YOUR NAUSEA MEDICATION  *UNUSUAL SHORTNESS OF BREATH  *UNUSUAL BRUISING OR BLEEDING  TENDERNESS IN MOUTH AND THROAT WITH OR WITHOUT PRESENCE OF ULCERS  *URINARY PROBLEMS  *BOWEL PROBLEMS  UNUSUAL RASH Items with * indicate a potential emergency and should be followed up as soon as possible.  Feel free to call the clinic you have any questions or concerns. The clinic phone number is [phone].  Please show the CHEMO ALERT CARD at check-in to the Emergency Department and triage nurse.   

## 2015-03-02 ENCOUNTER — Ambulatory Visit (HOSPITAL_BASED_OUTPATIENT_CLINIC_OR_DEPARTMENT_OTHER): Payer: Medicare Other

## 2015-03-02 VITALS — BP 148/68 | HR 80 | Temp 97.5°F | Resp 16

## 2015-03-02 DIAGNOSIS — Z5189 Encounter for other specified aftercare: Secondary | ICD-10-CM

## 2015-03-02 DIAGNOSIS — C50411 Malignant neoplasm of upper-outer quadrant of right female breast: Secondary | ICD-10-CM | POA: Diagnosis not present

## 2015-03-02 MED ORDER — PEGFILGRASTIM INJECTION 6 MG/0.6ML ~~LOC~~
6.0000 mg | PREFILLED_SYRINGE | Freq: Once | SUBCUTANEOUS | Status: AC
  Administered 2015-03-02: 6 mg via SUBCUTANEOUS

## 2015-03-02 NOTE — Patient Instructions (Signed)
Pegfilgrastim injection What is this medicine? PEGFILGRASTIM (peg fil GRA stim) is a long-acting granulocyte colony-stimulating factor that stimulates the growth of neutrophils, a type of white blood cell important in the body's fight against infection. It is used to reduce the incidence of fever and infection in patients with certain types of cancer who are receiving chemotherapy that affects the bone marrow. This medicine may be used for other purposes; ask your health care provider or pharmacist if you have questions. COMMON BRAND NAME(S): Neulasta What should I tell my health care provider before I take this medicine? They need to know if you have any of these conditions: -latex allergy -ongoing radiation therapy -sickle cell disease -skin reactions to acrylic adhesives (On-Body Injector only) -an unusual or allergic reaction to pegfilgrastim, filgrastim, other medicines, foods, dyes, or preservatives -pregnant or trying to get pregnant -breast-feeding How should I use this medicine? This medicine is for injection under the skin. If you get this medicine at home, you will be taught how to prepare and give the pre-filled syringe or how to use the On-body Injector. Refer to the patient Instructions for Use for detailed instructions. Use exactly as directed. Take your medicine at regular intervals. Do not take your medicine more often than directed. It is important that you put your used needles and syringes in a special sharps container. Do not put them in a trash can. If you do not have a sharps container, call your pharmacist or healthcare provider to get one. Talk to your pediatrician regarding the use of this medicine in children. Special care may be needed. Overdosage: If you think you have taken too much of this medicine contact a poison control center or emergency room at once. NOTE: This medicine is only for you. Do not share this medicine with others. What if I miss a dose? It is  important not to miss your dose. Call your doctor or health care professional if you miss your dose. If you miss a dose due to an On-body Injector failure or leakage, a new dose should be administered as soon as possible using a single prefilled syringe for manual use. What may interact with this medicine? Interactions have not been studied. Give your health care provider a list of all the medicines, herbs, non-prescription drugs, or dietary supplements you use. Also tell them if you smoke, drink alcohol, or use illegal drugs. Some items may interact with your medicine. This list may not describe all possible interactions. Give your health care provider a list of all the medicines, herbs, non-prescription drugs, or dietary supplements you use. Also tell them if you smoke, drink alcohol, or use illegal drugs. Some items may interact with your medicine. What should I watch for while using this medicine? You may need blood work done while you are taking this medicine. If you are going to need a MRI, CT scan, or other procedure, tell your doctor that you are using this medicine (On-Body Injector only). What side effects may I notice from receiving this medicine? Side effects that you should report to your doctor or health care professional as soon as possible: -allergic reactions like skin rash, itching or hives, swelling of the face, lips, or tongue -dizziness -fever -pain, redness, or irritation at site where injected -pinpoint red spots on the skin -shortness of breath or breathing problems -stomach or side pain, or pain at the shoulder -swelling -tiredness -trouble passing urine Side effects that usually do not require medical attention (report to your doctor   or health care professional if they continue or are bothersome): -bone pain -muscle pain This list may not describe all possible side effects. Call your doctor for medical advice about side effects. You may report side effects to FDA at  1-800-FDA-1088. Where should I keep my medicine? Keep out of the reach of children. Store pre-filled syringes in a refrigerator between 2 and 8 degrees C (36 and 46 degrees F). Do not freeze. Keep in carton to protect from light. Throw away this medicine if it is left out of the refrigerator for more than 48 hours. Throw away any unused medicine after the expiration date. NOTE: This sheet is a summary. It may not cover all possible information. If you have questions about this medicine, talk to your doctor, pharmacist, or health care provider.  2015, Elsevier/Gold Standard. (2013-08-31 16:14:05)  

## 2015-03-06 ENCOUNTER — Ambulatory Visit
Admission: RE | Admit: 2015-03-06 | Discharge: 2015-03-06 | Disposition: A | Discharge status: Home / Self Care | Payer: Medicare Other | Source: Ambulatory Visit | Attending: Nurse Practitioner | Admitting: Nurse Practitioner

## 2015-03-06 DIAGNOSIS — C50911 Malignant neoplasm of unspecified site of right female breast: Secondary | ICD-10-CM | POA: Diagnosis not present

## 2015-03-06 DIAGNOSIS — C50411 Malignant neoplasm of upper-outer quadrant of right female breast: Secondary | ICD-10-CM

## 2015-03-06 MED ORDER — GADOBENATE DIMEGLUMINE 529 MG/ML IV SOLN
10.0000 mL | Freq: Once | INTRAVENOUS | Status: AC | PRN
  Administered 2015-03-06: 10 mL via INTRAVENOUS

## 2015-03-08 ENCOUNTER — Other Ambulatory Visit: Payer: Self-pay | Admitting: General Surgery

## 2015-03-08 DIAGNOSIS — C50411 Malignant neoplasm of upper-outer quadrant of right female breast: Secondary | ICD-10-CM

## 2015-03-13 ENCOUNTER — Other Ambulatory Visit (HOSPITAL_BASED_OUTPATIENT_CLINIC_OR_DEPARTMENT_OTHER): Payer: Medicare Other

## 2015-03-13 ENCOUNTER — Other Ambulatory Visit: Payer: Self-pay | Admitting: Nurse Practitioner

## 2015-03-13 ENCOUNTER — Ambulatory Visit (HOSPITAL_BASED_OUTPATIENT_CLINIC_OR_DEPARTMENT_OTHER): Payer: Medicare Other | Admitting: Oncology

## 2015-03-13 ENCOUNTER — Telehealth: Payer: Self-pay | Admitting: Oncology

## 2015-03-13 VITALS — BP 139/72 | HR 73 | Temp 97.9°F | Resp 18 | Ht <= 58 in | Wt 111.1 lb

## 2015-03-13 DIAGNOSIS — C50411 Malignant neoplasm of upper-outer quadrant of right female breast: Secondary | ICD-10-CM | POA: Diagnosis not present

## 2015-03-13 LAB — CBC WITH DIFFERENTIAL/PLATELET
BASO%: 0.1 % (ref 0.0–2.0)
BASOS ABS: 0 10*3/uL (ref 0.0–0.1)
EOS ABS: 0 10*3/uL (ref 0.0–0.5)
EOS%: 0 % (ref 0.0–7.0)
HCT: 35.6 % (ref 34.8–46.6)
HEMOGLOBIN: 11.6 g/dL (ref 11.6–15.9)
LYMPH%: 15.2 % (ref 14.0–49.7)
MCH: 33.7 pg (ref 25.1–34.0)
MCHC: 32.6 g/dL (ref 31.5–36.0)
MCV: 103.5 fL — AB (ref 79.5–101.0)
MONO#: 0.3 10*3/uL (ref 0.1–0.9)
MONO%: 4.3 % (ref 0.0–14.0)
NEUT#: 6.4 10*3/uL (ref 1.5–6.5)
NEUT%: 80.4 % — ABNORMAL HIGH (ref 38.4–76.8)
PLATELETS: 154 10*3/uL (ref 145–400)
RBC: 3.44 10*6/uL — ABNORMAL LOW (ref 3.70–5.45)
RDW: 14 % (ref 11.2–14.5)
WBC: 8 10*3/uL (ref 3.9–10.3)
lymph#: 1.2 10*3/uL (ref 0.9–3.3)

## 2015-03-13 LAB — COMPREHENSIVE METABOLIC PANEL (CC13)
ALBUMIN: 3.8 g/dL (ref 3.5–5.0)
ALK PHOS: 95 U/L (ref 40–150)
ALT: 23 U/L (ref 0–55)
ANION GAP: 7 meq/L (ref 3–11)
AST: 17 U/L (ref 5–34)
BUN: 9.9 mg/dL (ref 7.0–26.0)
CO2: 28 mEq/L (ref 22–29)
Calcium: 9.4 mg/dL (ref 8.4–10.4)
Chloride: 106 mEq/L (ref 98–109)
Creatinine: 0.7 mg/dL (ref 0.6–1.1)
EGFR: 84 mL/min/{1.73_m2} — AB (ref >90)
GLUCOSE: 92 mg/dL (ref 70–140)
POTASSIUM: 3.4 meq/L — AB (ref 3.5–5.1)
Sodium: 141 mEq/L (ref 136–145)
Total Bilirubin: 0.45 mg/dL (ref 0.20–1.20)
Total Protein: 5.8 g/dL — ABNORMAL LOW (ref 6.4–8.3)

## 2015-03-13 NOTE — Telephone Encounter (Signed)
Gave asv & calendar for October thru January

## 2015-03-13 NOTE — Progress Notes (Signed)
[facility]  [phone]) [phone] Fax:([phone]) [phone]     ID: LAURABELLE GORCZYCA DOB: 1945/09/21  MR#: [id number]  FHQ#:[id number]  Patient Care Team: Harle Battiest, MD as PCP - General (Obstetrics and Gynecology) Autumn Messing III, MD as Consulting Physician (General Surgery) Chauncey Cruel, MD as Consulting Physician (Oncology) Eppie Gibson, MD as Attending Physician (Radiation Oncology) Mauro Kaufmann, RN as Registered Nurse Rockwell Germany, RN as Registered Nurse Holley Bouche, NP as Nurse Practitioner (Nurse Practitioner) Lona Kettle, MD (Family Medicine) PCP: Harle Battiest, MD OTHER MD:  CHIEF COMPLAINT: Triple positive breast cancer  CURRENT TREATMENT: anti-HER-2 immunotherapy  BREAST CANCER HISTORY: From the original intake note:  Tierra had not had a mammogram for approximately 4 years. Sometime mid 2015 she had an area of redness and tenderness in the right breast and she brought this to her physician's attention. She was treated with antibiotics and this completely cleared.Marland Kitchen  Approximately mid April, she again developed redness over the right breast. This was very focal, and it did not look "as red" as the previous time. She brought it to Dr. Harrington Challenger is attention, and was treated with antibiotics. Bilateral mammography with tomosynthesis and right breast ultrasonography was also obtained on 09/19/2014. There was a focal opacity with irregular margins in the upper outer right breast with some central calcifications. There was mild architectural distortion associated with this. On exam Dr. [name] noted a firm palpable tender mass lateral to the right areola with overlying erythema. On ultrasonography there was an irregular hypoechoic mass measuring 2.4 cm.  Repeat right breast ultrasonography after the patient completed her antibiotic course was performed 10/04/2014. On exam there was still a palpable lobulated mass lateral to the right nipple and by ultrasound  this measured 2.6 cm. It was felt to be a possible abscess and a second course of antibiotics, now with Septra, was undertaken. After that treatment, repeat ultrasonography 10/16/2014 continued to show the palpable mass and ultrasonography now found the mass to measure 2.9 cm maximally.  Accordingly biopsy of the mass in question was obtained that same day, 10/16/2014, and showed ([id number]) and invasive ductal carcinoma, grade 2 or 3, estrogen receptor 90% positive, progesterone receptor 80% positive, both with strong staining intensity, with an MIB-1 of 20%, and with HER-2 amplification, the signals ratio being 3.60 and the number per cell 4.50.  Bilateral breast MRI 10/23/2014 showed a breast density to be category B. In the right breast there was an irregular enhancing mass at the 9:30 o'clock position measuring 3.1 cm. There were no additional worrisome masses in the right breast, no findings of concern in the left breast, and no abnormal appearing lymph nodes.  The patient's subsequent history is as detailed below  INTERVAL HISTORY: Orianna returns today for follow-up of her HER-2 positive breast cancer accompanied by her son [name]. Today is day 8 cycle 6 of her six planned cycles of carboplatin, docetaxel, trastuzumab and pertuzumab.  REVIEW OF SYSTEMS: Reviewing her tolerance, it was "hardt". She really did not recover until the third week after treatment. She tried to walk but she felt very weak and while she still did take some short walks, she also took naps during the day. She lost weight in this process likely because her sense of taste is so changed. She had minimal mouth sores. She had some nausea but no vomiting. She did take her anti-medics exactly as prescribed. She never had fever, or constipation problems. She had mild diarrhea after each  pertuzumab treatment lasting about 3-4 days, well controlled. She still has watering of her eyes, though, left greater than right. She is still  using the Tobradex drops. A detailed review of systems was otherwise stable  PAST MEDICAL HISTORY: Past Medical History  Diagnosis Date  . Breast cancer of upper-outer quadrant of right female breast 10/19/2014  . Breast cancer   . History of hiatal hernia   . IBS (irritable bowel syndrome)   . Lactose intolerance   . Cancer     RIGHT BREAST CANCER  Irritable bowel symptoms  PAST SURGICAL HISTORY: Past Surgical History  Procedure Laterality Date  . Breast surgery      BIOPSY OF BREAST  . Colonoscopy    . Portacath placement Left 11/02/2014    Procedure: INSERTION PORT-A-CATH;  Surgeon: Autumn Messing III, MD;  Location: [facility];  Service: General;  Laterality: Left;    FAMILY HISTORY No family history on file. The patient's father died at the age of 8 from a myocardial infarction. The patient's mother died at the age of 42 following a stroke. The patient had 2 brothers. One had Down's syndrome. She had no sisters. The only breast cancer in the family was the patient's father's only sister was diagnosed with breast cancer in her 31s. There is no history of ovarian cancer in the family to the patient's knowledge   GYNECOLOGIC HISTORY:  No LMP recorded. Patient is postmenopausal.  menarche age 69, first live birth age 31, she is [place] P2. She went through the change of life in late 1999. She did not take hormone replacement. She took birth control pills for less than 2 years in her 12Y, with no complications   SOCIAL HISTORY:  Jamiesha is my neighbor. 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 Bill died from widely metastatic malignant melanoma within 4 months of diagnosis some years ago. The patient lives with her son [name] and his wife, who is expecting their first child late November 2016. The other son, [name], lives in [place]. Both sons are appraised her's. The patient has 2 grandchildren and as just noted one on the way. The patient attends the  [place]: Not in place. At the 10/24/2014 visit the patient was given the appropriate documents 2 complete and notarize at her discretion   HEALTH MAINTENANCE: Social History  Substance Use Topics  . Smoking status: Former Smoker    Types: Cigarettes    Quit date: 08/03/2014  . Smokeless tobacco: Not on file  . Alcohol Use: 3.6 oz/week    6 Glasses of wine per week     Colonoscopy: 2000?  PAP:  Bone density: On wrist, remote   Lipid panel:  Allergies  Allergen Reactions  . Aspirin     Stomach pain  . Aspirin Nausea Only    GI upset/pain    Current Outpatient Prescriptions  Medication Sig Dispense Refill  . albuterol (PROVENTIL HFA;VENTOLIN HFA) 108 (90 BASE) MCG/ACT inhaler Inhale 1-2 puffs into the lungs every 6 (six) hours as needed for wheezing or shortness of breath. (Patient not taking: Reported on 01/16/2015) 1 Inhaler 2  . dexamethasone (DECADRON) 4 MG tablet Take two tablets (8 mg) twice a day w food the day before chemo, then the day after chemo and the day after that; last dose the morning of the 4th day counting from chemo as day 1 10 tablet 0  .  fluconazole (DIFLUCAN) 100 MG tablet Take one tablet daily starting day four from chemo and continuing for four days or until thrush resolves 30 tablet 1  . hyoscyamine (ANASPAZ) 0.125 MG TBDP disintergrating tablet Place 1 tablet (0.125 mg total) under the tongue every 6 (six) hours as needed for cramping. (Patient not taking: Reported on 01/23/2015) 10 tablet 0  . lidocaine-prilocaine (EMLA) cream Apply 1 application topically as needed. 30 g 0  . LORazepam (ATIVAN) 0.5 MG tablet Take 1 tablet (0.5 mg total) by mouth at bedtime. (Patient not taking: Reported on 02/28/2015) 30 tablet 0  . ondansetron (ZOFRAN) 8 MG tablet Take one tablet twice a day starting the evening of chemo and continuing through the evening of day 3 40 tablet 1  . prochlorperazine (COMPAZINE) 10 MG tablet Take 1 tablet (10  mg total) by mouth every 6 (six) hours as needed for nausea or vomiting. (Patient not taking: Reported on 01/03/2015) 30 tablet 0  . tobramycin-dexamethasone (TOBRADEX) ophthalmic solution Place 1 drop into both eyes every 4 (four) hours while awake. 5 mL 0   No current facility-administered medications for this visit.    OBJECTIVE: middle-aged white woman who appears stated age 29 Vitals:   03/13/15 1046  BP: 139/72  Pulse: 73  Temp: 97.9 F (36.6 C)  Resp: 18     Body mass index is 24.47 kg/(m^2).    ECOG FS:1 - Symptomatic but completely ambulatory Filed Weights   03/13/15 1046  Weight: 111 lb 1.6 oz (50.395 kg)   baseline weight 122 pounds  Sclerae unicteric, pupils round and equal Oropharynx clear and moist-- no thrush or other lesions No cervical or supraclavicular adenopathy Lungs no rales or rhonchi Heart regular rate and rhythm Abd soft, nontender, positive bowel sounds MSK no focal spinal tenderness, no upper extremity lymphedema Neuro: nonfocal, well oriented, appropriate affect Breasts: Deferred   LAB RESULTS:  CMP     Component Value Date/Time   NA 141 02/28/2015 0842   NA 140 11/01/2014 1023   K 3.9 02/28/2015 0842   K 4.2 11/01/2014 1023   CL 104 11/01/2014 1023   CO2 26 02/28/2015 0842   CO2 26 11/01/2014 1023   GLUCOSE 132 02/28/2015 0842   GLUCOSE 93 11/01/2014 1023   BUN 15.3 02/28/2015 0842   BUN 10 11/01/2014 1023   CREATININE 0.7 02/28/2015 0842   CREATININE 0.85 11/01/2014 1023   CALCIUM 10.4 02/28/2015 0842   CALCIUM 9.9 11/01/2014 1023   PROT 6.3* 02/28/2015 0842   ALBUMIN 4.1 02/28/2015 0842   AST 12 02/28/2015 0842   ALT 14 02/28/2015 0842   ALKPHOS 66 02/28/2015 0842   BILITOT 0.41 02/28/2015 0842   GFRNONAA >60 11/01/2014 1023   GFRAA >60 11/01/2014 1023    INo results found for: SPEP, UPEP  Lab Results  Component Value Date   WBC 8.0 03/13/2015   NEUTROABS 6.4 03/13/2015   HGB 11.6 03/13/2015   HCT 35.6 03/13/2015    MCV 103.5* 03/13/2015   PLT 154 03/13/2015      Chemistry      Component Value Date/Time   NA 141 02/28/2015 0842   NA 140 11/01/2014 1023   K 3.9 02/28/2015 0842   K 4.2 11/01/2014 1023   CL 104 11/01/2014 1023   CO2 26 02/28/2015 0842   CO2 26 11/01/2014 1023   BUN 15.3 02/28/2015 0842   BUN 10 11/01/2014 1023   CREATININE 0.7 02/28/2015 0842   CREATININE 0.85 11/01/2014 1023  Component Value Date/Time   CALCIUM 10.4 02/28/2015 0842   CALCIUM 9.9 11/01/2014 1023   ALKPHOS 66 02/28/2015 0842   AST 12 02/28/2015 0842   ALT 14 02/28/2015 0842   BILITOT 0.41 02/28/2015 0842       No results found for: LABCA2  No components found for: LABCA125  No results for input(s): INR in the last 168 hours.  Urinalysis    Component Value Date/Time   LABSPEC 1.020 12/21/2014 1405   PHURINE 5.0 12/21/2014 1405   GLUCOSEU Negative 12/21/2014 1405   HGBUR Negative 12/21/2014 1405   BILIRUBINUR Negative 12/21/2014 1405   KETONESUR Negative 12/21/2014 1405   PROTEINUR < 30 12/21/2014 1405   UROBILINOGEN 0.2 12/21/2014 1405   NITRITE Negative 12/21/2014 1405   LEUKOCYTESUR Small 12/21/2014 1405    STUDIES: Mr Breast Bilateral W Wo Contrast  03/06/2015   CLINICAL DATA:  69 year old female with biopsy-proven right breast cancer - evaluate neoadjuvant therapy.  LABS:  None performed today  EXAM: BILATERAL BREAST MRI WITH AND WITHOUT CONTRAST  TECHNIQUE: Multiplanar, multisequence MR images of both breasts were obtained prior to and following the intravenous administration of 10 ml of MultiHance.  THREE-DIMENSIONAL MR IMAGE RENDERING ON INDEPENDENT WORKSTATION:  Three-dimensional MR images were rendered by post-processing of the original MR data on an independent workstation. The three-dimensional MR images were interpreted, and findings are reported in the following complete MRI report for this study. Three dimensional images were evaluated at the independent DynaCad workstation   COMPARISON:  10/23/2014 MR.  Prior mammograms and ultrasounds  FINDINGS: Breast composition: c. Heterogeneous fibroglandular tissue.  Background parenchymal enhancement: Moderate.  Right breast: A 1 x 1 x 1.9 cm area of non masslike enhancement is now identified in the outer/ upper-outer right breast (middle third) in the area of known breast cancer. On prior MR, this malignancy was masslike and solid measuring 3.1 x 2.2 x 1.6 cm. Adjacent biopsy clip artifact is noted. No other abnormal areas of enhancement within the right breast noted.  Left breast: No mass or abnormal enhancement.  Lymph nodes: No abnormal appearing lymph nodes.  Ancillary findings:  None.  IMPRESSION: Treatment response with residual 1 x 1 x 1.9 cm non masslike enhancement in the outer/ upper-outer right breast, corresponding to known malignancy. Previously, this was masslike and solid measuring 3.1 x 2.2 x 1.6 cm.  No MR evidence of left breast malignancy or abnormal appearing lymph nodes.  RECOMMENDATION: Treatment plan  BI-RADS CATEGORY  6: Known biopsy-proven malignancy.   Electronically Signed   By: Margarette Canada M.D.   On: 03/06/2015 16:29    ASSESSMENT: 69 y.o. [place] woman status post right breast biopsy 10/16/2014 for a clinical T3 N0, stage IIA invasive ductal carcinoma, grade 2 or 3, estrogen and progesterone receptor positive, with an MIB-1 of 20%, and HER-2 amplified, with a signals ratio of 3.60  (1) chemotherapy and anti-HER-2 immunotherapy started 11/05/2014, consisting of carboplatin, docetaxel, trastuzumab and pertuzumab given every 21 days 6, with Neulasta support-- completed 02/28/2015  (2) trastuzumab will be continued to complete 1 year  (a) echocardiogram 01/24/2015 shows an ejection fraction of 55%  (3) definitive surgery to follow chemotherapy  (4) adjuvant radiation to follow surgery as appropriate  (5) anti-estrogens to follow completion of local treatments.  PLAN: Jeanae has completed her  chemotherapy. She generally tolerated it remarkably well. Of course she will be continuing trastuzumab alone every 3 weeks until she completes a year on that medication.  We reviewed her  MRI results which are very favorable. I am hoping the area we can still see in the right breast is going to proved to be noninvasive residual disease.  She has already an appointment to meet with Dr. [name] and the likely surgical date will be mid October. Accordingly she will return to see me a little after that so we can review her pathology results.  Of course she will need a repeat echocardiogram late November.  In the meantime I have encouraged her to resume her prior exercise program. She is eating a high calorie diet in an attempt to regain the few pounds she has lost. Nothing wrong with that so long as she is more active as well.  She knows to call for any problems that may develop before her next visit here.  Chauncey Cruel, MD   03/13/2015 11:11 AM

## 2015-03-14 DIAGNOSIS — C50411 Malignant neoplasm of upper-outer quadrant of right female breast: Secondary | ICD-10-CM | POA: Diagnosis not present

## 2015-03-15 ENCOUNTER — Other Ambulatory Visit: Payer: Self-pay | Admitting: General Surgery

## 2015-03-15 DIAGNOSIS — C50411 Malignant neoplasm of upper-outer quadrant of right female breast: Secondary | ICD-10-CM

## 2015-03-18 ENCOUNTER — Other Ambulatory Visit: Payer: Self-pay | Admitting: Nurse Practitioner

## 2015-03-20 ENCOUNTER — Other Ambulatory Visit: Payer: Self-pay | Admitting: Oncology

## 2015-03-21 ENCOUNTER — Encounter (HOSPITAL_BASED_OUTPATIENT_CLINIC_OR_DEPARTMENT_OTHER): Payer: Self-pay | Admitting: *Deleted

## 2015-03-21 ENCOUNTER — Ambulatory Visit (HOSPITAL_BASED_OUTPATIENT_CLINIC_OR_DEPARTMENT_OTHER): Payer: Medicare Other

## 2015-03-21 ENCOUNTER — Encounter: Payer: Self-pay | Admitting: Nutrition

## 2015-03-21 ENCOUNTER — Other Ambulatory Visit (HOSPITAL_BASED_OUTPATIENT_CLINIC_OR_DEPARTMENT_OTHER): Payer: Medicare Other

## 2015-03-21 ENCOUNTER — Encounter: Payer: Self-pay | Admitting: *Deleted

## 2015-03-21 VITALS — BP 150/61 | HR 60 | Temp 97.9°F | Resp 18

## 2015-03-21 DIAGNOSIS — Z5112 Encounter for antineoplastic immunotherapy: Secondary | ICD-10-CM

## 2015-03-21 DIAGNOSIS — C50411 Malignant neoplasm of upper-outer quadrant of right female breast: Secondary | ICD-10-CM

## 2015-03-21 LAB — CBC WITH DIFFERENTIAL/PLATELET
BASO%: 0.3 % (ref 0.0–2.0)
Basophils Absolute: 0 10*3/uL (ref 0.0–0.1)
EOS%: 0 % (ref 0.0–7.0)
Eosinophils Absolute: 0 10*3/uL (ref 0.0–0.5)
HEMATOCRIT: 35 % (ref 34.8–46.6)
HEMOGLOBIN: 11.4 g/dL — AB (ref 11.6–15.9)
LYMPH#: 0.9 10*3/uL (ref 0.9–3.3)
LYMPH%: 23.1 % (ref 14.0–49.7)
MCH: 33.9 pg (ref 25.1–34.0)
MCHC: 32.6 g/dL (ref 31.5–36.0)
MCV: 104.2 fL — ABNORMAL HIGH (ref 79.5–101.0)
MONO#: 0.4 10*3/uL (ref 0.1–0.9)
MONO%: 9.4 % (ref 0.0–14.0)
NEUT%: 67.2 % (ref 38.4–76.8)
NEUTROS ABS: 2.6 10*3/uL (ref 1.5–6.5)
Platelets: 156 10*3/uL (ref 145–400)
RBC: 3.36 10*6/uL — ABNORMAL LOW (ref 3.70–5.45)
RDW: 13.8 % (ref 11.2–14.5)
WBC: 3.9 10*3/uL (ref 3.9–10.3)

## 2015-03-21 LAB — COMPREHENSIVE METABOLIC PANEL (CC13)
ALBUMIN: 3.7 g/dL (ref 3.5–5.0)
ALK PHOS: 67 U/L (ref 40–150)
ALT: 16 U/L (ref 0–55)
AST: 16 U/L (ref 5–34)
Anion Gap: 7 mEq/L (ref 3–11)
BUN: 10.2 mg/dL (ref 7.0–26.0)
CALCIUM: 9.4 mg/dL (ref 8.4–10.4)
CO2: 25 mEq/L (ref 22–29)
CREATININE: 0.7 mg/dL (ref 0.6–1.1)
Chloride: 108 mEq/L (ref 98–109)
EGFR: >90 mL/min/{1.73_m2} (ref >90)
GLUCOSE: 92 mg/dL (ref 70–140)
Potassium: 3.9 mEq/L (ref 3.5–5.1)
SODIUM: 141 meq/L (ref 136–145)
Total Bilirubin: 0.42 mg/dL (ref 0.20–1.20)
Total Protein: 5.6 g/dL — ABNORMAL LOW (ref 6.4–8.3)

## 2015-03-21 MED ORDER — ACETAMINOPHEN 325 MG PO TABS
650.0000 mg | ORAL_TABLET | Freq: Once | ORAL | Status: AC
  Administered 2015-03-21: 650 mg via ORAL

## 2015-03-21 MED ORDER — ACETAMINOPHEN 325 MG PO TABS
ORAL_TABLET | ORAL | Status: AC
  Filled 2015-03-21: qty 2

## 2015-03-21 MED ORDER — DIPHENHYDRAMINE HCL 25 MG PO CAPS
25.0000 mg | ORAL_CAPSULE | Freq: Once | ORAL | Status: AC
  Administered 2015-03-21: 25 mg via ORAL

## 2015-03-21 MED ORDER — SODIUM CHLORIDE 0.9 % IV SOLN
Freq: Once | INTRAVENOUS | Status: AC
  Administered 2015-03-21: 12:00:00 via INTRAVENOUS

## 2015-03-21 MED ORDER — DIPHENHYDRAMINE HCL 25 MG PO CAPS
ORAL_CAPSULE | ORAL | Status: AC
  Filled 2015-03-21: qty 1

## 2015-03-21 MED ORDER — TRASTUZUMAB CHEMO INJECTION 440 MG
6.0000 mg/kg | Freq: Once | INTRAVENOUS | Status: AC
  Administered 2015-03-21: 294 mg via INTRAVENOUS
  Filled 2015-03-21: qty 14

## 2015-03-21 MED ORDER — SODIUM CHLORIDE 0.9 % IJ SOLN
10.0000 mL | INTRAMUSCULAR | Status: DC | PRN
  Administered 2015-03-21: 10 mL
  Filled 2015-03-21: qty 10

## 2015-03-21 MED ORDER — HEPARIN SOD (PORK) LOCK FLUSH 100 UNIT/ML IV SOLN
500.0000 [IU] | Freq: Once | INTRAVENOUS | Status: AC | PRN
  Administered 2015-03-21: 500 [IU]
  Filled 2015-03-21: qty 5

## 2015-03-21 NOTE — Patient Instructions (Addendum)
[facility] Discharge Instructions for Patients  Today you received the following: Herceptin   To help prevent nausea and vomiting after your treatment, we encourage you to take your nausea medication as directed.   If you develop nausea and vomiting that is not controlled by your nausea medication, call the clinic.   BELOW ARE SYMPTOMS THAT SHOULD BE REPORTED IMMEDIATELY:  *FEVER GREATER THAN 100.5 F  *CHILLS WITH OR WITHOUT FEVER  NAUSEA AND VOMITING THAT IS NOT CONTROLLED WITH YOUR NAUSEA MEDICATION  *UNUSUAL SHORTNESS OF BREATH  *UNUSUAL BRUISING OR BLEEDING  TENDERNESS IN MOUTH AND THROAT WITH OR WITHOUT PRESENCE OF ULCERS  *URINARY PROBLEMS  *BOWEL PROBLEMS  UNUSUAL RASH Items with * indicate a potential emergency and should be followed up as soon as possible.  Feel free to call the clinic you have any questions or concerns. The clinic phone number is [phone].  Please show the CHEMO ALERT CARD at check-in to the Emergency Department and triage nurse.   

## 2015-03-25 ENCOUNTER — Ambulatory Visit
Admission: RE | Admit: 2015-03-25 | Discharge: 2015-03-25 | Disposition: A | Discharge status: Home / Self Care | Payer: Medicare Other | Source: Ambulatory Visit | Attending: General Surgery | Admitting: General Surgery

## 2015-03-25 DIAGNOSIS — D0511 Intraductal carcinoma in situ of right breast: Secondary | ICD-10-CM | POA: Diagnosis not present

## 2015-03-25 DIAGNOSIS — Z01818 Encounter for other preprocedural examination: Secondary | ICD-10-CM | POA: Diagnosis not present

## 2015-03-25 DIAGNOSIS — C50411 Malignant neoplasm of upper-outer quadrant of right female breast: Secondary | ICD-10-CM

## 2015-03-28 ENCOUNTER — Ambulatory Visit
Admission: RE | Admit: 2015-03-28 | Discharge: 2015-03-28 | Disposition: A | Discharge status: Home / Self Care | Payer: Medicare Other | Source: Ambulatory Visit | Attending: General Surgery | Admitting: General Surgery

## 2015-03-28 ENCOUNTER — Ambulatory Visit (HOSPITAL_BASED_OUTPATIENT_CLINIC_OR_DEPARTMENT_OTHER): Payer: Medicare Other | Admitting: Anesthesiology

## 2015-03-28 ENCOUNTER — Encounter (HOSPITAL_BASED_OUTPATIENT_CLINIC_OR_DEPARTMENT_OTHER)
Admission: RE | Disposition: A | Discharge status: Home / Self Care | Payer: Self-pay | Source: Ambulatory Visit | Attending: General Surgery

## 2015-03-28 ENCOUNTER — Ambulatory Visit (HOSPITAL_COMMUNITY)
Admission: RE | Admit: 2015-03-28 | Discharge: 2015-03-28 | Disposition: A | Discharge status: Home / Self Care | Payer: Medicare Other | Source: Ambulatory Visit | Attending: General Surgery | Admitting: General Surgery

## 2015-03-28 ENCOUNTER — Ambulatory Visit (HOSPITAL_BASED_OUTPATIENT_CLINIC_OR_DEPARTMENT_OTHER)
Admission: RE | Admit: 2015-03-28 | Discharge: 2015-03-28 | Disposition: A | Discharge status: Home / Self Care | Payer: Medicare Other | Source: Ambulatory Visit | Attending: General Surgery | Admitting: General Surgery

## 2015-03-28 ENCOUNTER — Encounter (HOSPITAL_BASED_OUTPATIENT_CLINIC_OR_DEPARTMENT_OTHER): Payer: Self-pay | Admitting: *Deleted

## 2015-03-28 DIAGNOSIS — Z170 Estrogen receptor positive status [ER+]: Secondary | ICD-10-CM | POA: Diagnosis not present

## 2015-03-28 DIAGNOSIS — Z79899 Other long term (current) drug therapy: Secondary | ICD-10-CM | POA: Diagnosis not present

## 2015-03-28 DIAGNOSIS — C50411 Malignant neoplasm of upper-outer quadrant of right female breast: Secondary | ICD-10-CM | POA: Diagnosis not present

## 2015-03-28 DIAGNOSIS — R079 Chest pain, unspecified: Secondary | ICD-10-CM | POA: Diagnosis not present

## 2015-03-28 DIAGNOSIS — C50911 Malignant neoplasm of unspecified site of right female breast: Secondary | ICD-10-CM | POA: Diagnosis not present

## 2015-03-28 DIAGNOSIS — Z9011 Acquired absence of right breast and nipple: Secondary | ICD-10-CM | POA: Diagnosis not present

## 2015-03-28 DIAGNOSIS — Z9221 Personal history of antineoplastic chemotherapy: Secondary | ICD-10-CM | POA: Diagnosis not present

## 2015-03-28 DIAGNOSIS — Z87891 Personal history of nicotine dependence: Secondary | ICD-10-CM | POA: Insufficient documentation

## 2015-03-28 DIAGNOSIS — G8918 Other acute postprocedural pain: Secondary | ICD-10-CM | POA: Diagnosis not present

## 2015-03-28 HISTORY — PX: BREAST LUMPECTOMY: SHX2

## 2015-03-28 HISTORY — PX: RADIOACTIVE SEED GUIDED PARTIAL MASTECTOMY WITH AXILLARY SENTINEL LYMPH NODE BIOPSY: SHX6520

## 2015-03-28 SURGERY — RADIOACTIVE SEED GUIDED PARTIAL MASTECTOMY WITH AXILLARY SENTINEL LYMPH NODE BIOPSY
Anesthesia: General | Site: Breast | Laterality: Right

## 2015-03-28 MED ORDER — PHENYLEPHRINE HCL 10 MG/ML IJ SOLN
INTRAMUSCULAR | Status: DC | PRN
  Administered 2015-03-28: 40 ug via INTRAVENOUS

## 2015-03-28 MED ORDER — TECHNETIUM TC 99M SULFUR COLLOID FILTERED
1.0000 | Freq: Once | INTRAVENOUS | Status: AC | PRN
  Administered 2015-03-28: 1 via INTRADERMAL

## 2015-03-28 MED ORDER — MIDAZOLAM HCL 2 MG/2ML IJ SOLN
INTRAMUSCULAR | Status: AC
  Filled 2015-03-28: qty 2

## 2015-03-28 MED ORDER — MIDAZOLAM HCL 2 MG/2ML IJ SOLN
1.0000 mg | INTRAMUSCULAR | Status: DC | PRN
  Administered 2015-03-28 (×2): 1 mg via INTRAVENOUS

## 2015-03-28 MED ORDER — PROPOFOL 500 MG/50ML IV EMUL
INTRAVENOUS | Status: AC
  Filled 2015-03-28: qty 50

## 2015-03-28 MED ORDER — METHYLENE BLUE 1 % INJ SOLN
INTRAMUSCULAR | Status: AC
  Filled 2015-03-28: qty 10

## 2015-03-28 MED ORDER — ONDANSETRON HCL 4 MG/2ML IJ SOLN
INTRAMUSCULAR | Status: AC
  Filled 2015-03-28: qty 2

## 2015-03-28 MED ORDER — LACTATED RINGERS IV SOLN
INTRAVENOUS | Status: DC
  Administered 2015-03-28 (×2): via INTRAVENOUS

## 2015-03-28 MED ORDER — LACTATED RINGERS IV SOLN: INTRAVENOUS | Status: DC

## 2015-03-28 MED ORDER — PROPOFOL 10 MG/ML IV BOLUS
INTRAVENOUS | Status: DC | PRN
  Administered 2015-03-28: 150 mg via INTRAVENOUS

## 2015-03-28 MED ORDER — BUPIVACAINE-EPINEPHRINE (PF) 0.5% -1:200000 IJ SOLN
INTRAMUSCULAR | Status: DC | PRN
  Administered 2015-03-28: 25 mL

## 2015-03-28 MED ORDER — DEXAMETHASONE SODIUM PHOSPHATE 10 MG/ML IJ SOLN
INTRAMUSCULAR | Status: AC
  Filled 2015-03-28: qty 1

## 2015-03-28 MED ORDER — 0.9 % SODIUM CHLORIDE (POUR BTL) OPTIME
TOPICAL | Status: DC | PRN
  Administered 2015-03-28: 400 mL

## 2015-03-28 MED ORDER — CEFAZOLIN SODIUM-DEXTROSE 2-3 GM-% IV SOLR
INTRAVENOUS | Status: AC
  Filled 2015-03-28: qty 50

## 2015-03-28 MED ORDER — FENTANYL CITRATE (PF) 100 MCG/2ML IJ SOLN
50.0000 ug | INTRAMUSCULAR | Status: AC | PRN
  Administered 2015-03-28: 50 ug via INTRAVENOUS
  Administered 2015-03-28 (×2): 25 ug via INTRAVENOUS

## 2015-03-28 MED ORDER — PROMETHAZINE HCL 25 MG/ML IJ SOLN: 6.2500 mg | INTRAMUSCULAR | Status: DC | PRN

## 2015-03-28 MED ORDER — HYDROMORPHONE HCL 1 MG/ML IJ SOLN
0.2500 mg | INTRAMUSCULAR | Status: DC | PRN
  Administered 2015-03-28 (×2): 0.5 mg via INTRAVENOUS

## 2015-03-28 MED ORDER — LIDOCAINE HCL (CARDIAC) 20 MG/ML IV SOLN
INTRAVENOUS | Status: AC
  Filled 2015-03-28: qty 5

## 2015-03-28 MED ORDER — ONDANSETRON HCL 4 MG/2ML IJ SOLN
INTRAMUSCULAR | Status: DC | PRN
  Administered 2015-03-28: 4 mg via INTRAVENOUS

## 2015-03-28 MED ORDER — BUPIVACAINE HCL (PF) 0.25 % IJ SOLN
INTRAMUSCULAR | Status: DC | PRN
  Administered 2015-03-28: 10 mL

## 2015-03-28 MED ORDER — BUPIVACAINE HCL (PF) 0.25 % IJ SOLN
INTRAMUSCULAR | Status: AC
  Filled 2015-03-28: qty 30

## 2015-03-28 MED ORDER — GLYCOPYRROLATE 0.2 MG/ML IJ SOLN: 0.2000 mg | Freq: Once | INTRAMUSCULAR | Status: DC | PRN

## 2015-03-28 MED ORDER — FENTANYL CITRATE (PF) 100 MCG/2ML IJ SOLN
INTRAMUSCULAR | Status: AC
  Filled 2015-03-28: qty 2

## 2015-03-28 MED ORDER — CHLORHEXIDINE GLUCONATE 4 % EX LIQD: 1.0000 "application " | Freq: Once | CUTANEOUS | Status: DC

## 2015-03-28 MED ORDER — CEFAZOLIN SODIUM-DEXTROSE 2-3 GM-% IV SOLR
2.0000 g | INTRAVENOUS | Status: AC
  Administered 2015-03-28: 2 g via INTRAVENOUS

## 2015-03-28 MED ORDER — MEPERIDINE HCL 25 MG/ML IJ SOLN: 6.2500 mg | INTRAMUSCULAR | Status: DC | PRN

## 2015-03-28 MED ORDER — DEXAMETHASONE SODIUM PHOSPHATE 4 MG/ML IJ SOLN
INTRAMUSCULAR | Status: DC | PRN
  Administered 2015-03-28: 8 mg via INTRAVENOUS

## 2015-03-28 MED ORDER — HYDROMORPHONE HCL 1 MG/ML IJ SOLN
INTRAMUSCULAR | Status: AC
  Filled 2015-03-28: qty 1

## 2015-03-28 MED ORDER — OXYCODONE-ACETAMINOPHEN 5-325 MG PO TABS: 1.0000 | ORAL_TABLET | ORAL | Status: DC | PRN

## 2015-03-28 MED ORDER — FENTANYL CITRATE (PF) 100 MCG/2ML IJ SOLN
INTRAMUSCULAR | Status: AC
  Filled 2015-03-28: qty 4

## 2015-03-28 MED ORDER — SCOPOLAMINE 1 MG/3DAYS TD PT72: 1.0000 | MEDICATED_PATCH | Freq: Once | TRANSDERMAL | Status: DC | PRN

## 2015-03-28 MED ORDER — LIDOCAINE HCL (CARDIAC) 20 MG/ML IV SOLN
INTRAVENOUS | Status: DC | PRN
  Administered 2015-03-28: 50 mg via INTRAVENOUS

## 2015-03-28 SURGICAL SUPPLY — 46 items
APPLIER CLIP 9.375 MED OPEN (MISCELLANEOUS) ×3
BINDER BREAST MEDIUM (GAUZE/BANDAGES/DRESSINGS) ×3 IMPLANT
BLADE SURG 15 STRL LF DISP TIS (BLADE) ×1 IMPLANT
BLADE SURG 15 STRL SS (BLADE) ×2
CANISTER SUC SOCK COL 7IN (MISCELLANEOUS) IMPLANT
CANISTER SUCT 1200ML W/VALVE (MISCELLANEOUS) IMPLANT
CHLORAPREP W/TINT 26ML (MISCELLANEOUS) ×3 IMPLANT
CLIP APPLIE 9.375 MED OPEN (MISCELLANEOUS) ×1 IMPLANT
COVER BACK TABLE 60X90IN (DRAPES) ×3 IMPLANT
COVER MAYO STAND STRL (DRAPES) ×3 IMPLANT
COVER PROBE W GEL 5X96 (DRAPES) ×3 IMPLANT
DECANTER SPIKE VIAL GLASS SM (MISCELLANEOUS) IMPLANT
DEVICE DUBIN W/COMP PLATE 8390 (MISCELLANEOUS) ×3 IMPLANT
DRAPE LAPAROSCOPIC ABDOMINAL (DRAPES) ×3 IMPLANT
DRAPE UTILITY XL STRL (DRAPES) ×3 IMPLANT
ELECT COATED BLADE 2.86 ST (ELECTRODE) ×3 IMPLANT
ELECT REM PT RETURN 9FT ADLT (ELECTROSURGICAL) ×3
ELECTRODE REM PT RTRN 9FT ADLT (ELECTROSURGICAL) ×1 IMPLANT
GAUZE SPONGE 4X4 12PLY STRL (GAUZE/BANDAGES/DRESSINGS) ×3 IMPLANT
GLOVE BIO SURGEON STRL SZ 6.5 (GLOVE) ×2 IMPLANT
GLOVE BIO SURGEON STRL SZ7.5 (GLOVE) ×3 IMPLANT
GLOVE BIO SURGEONS STRL SZ 6.5 (GLOVE) ×1
GLOVE BIOGEL PI IND STRL 7.0 (GLOVE) ×1 IMPLANT
GLOVE BIOGEL PI INDICATOR 7.0 (GLOVE) ×2
GLOVE EXAM NITRILE LRG STRL (GLOVE) ×3 IMPLANT
GOWN STRL REUS W/ TWL LRG LVL3 (GOWN DISPOSABLE) ×2 IMPLANT
GOWN STRL REUS W/TWL LRG LVL3 (GOWN DISPOSABLE) ×4
KIT MARKER MARGIN INK (KITS) ×3 IMPLANT
LIQUID BAND (GAUZE/BANDAGES/DRESSINGS) ×3 IMPLANT
NDL SAFETY ECLIPSE 18X1.5 (NEEDLE) IMPLANT
NEEDLE HYPO 18GX1.5 SHARP (NEEDLE)
NEEDLE HYPO 25X1 1.5 SAFETY (NEEDLE) ×3 IMPLANT
NS IRRIG 1000ML POUR BTL (IV SOLUTION) ×3 IMPLANT
PACK BASIN DAY SURGERY FS (CUSTOM PROCEDURE TRAY) ×3 IMPLANT
PENCIL BUTTON HOLSTER BLD 10FT (ELECTRODE) ×3 IMPLANT
SLEEVE SCD COMPRESS KNEE MED (MISCELLANEOUS) ×3 IMPLANT
SPONGE LAP 18X18 X RAY DECT (DISPOSABLE) ×3 IMPLANT
SUT MON AB 4-0 PC3 18 (SUTURE) ×6 IMPLANT
SUT SILK 2 0 SH (SUTURE) IMPLANT
SUT VICRYL 3-0 CR8 SH (SUTURE) ×3 IMPLANT
SYR CONTROL 10ML LL (SYRINGE) ×3 IMPLANT
TOWEL OR 17X24 6PK STRL BLUE (TOWEL DISPOSABLE) ×3 IMPLANT
TOWEL OR NON WOVEN STRL DISP B (DISPOSABLE) ×3 IMPLANT
TUBE CONNECTING 20'X1/4 (TUBING) ×1
TUBE CONNECTING 20X1/4 (TUBING) ×2 IMPLANT
YANKAUER SUCT BULB TIP NO VENT (SUCTIONS) ×3 IMPLANT

## 2015-03-28 NOTE — Anesthesia Preprocedure Evaluation (Addendum)
Anesthesia Evaluation  Patient identified by MRN, date of birth, ID band Patient awake    Reviewed: Allergy & Precautions, NPO status , Patient's Chart, lab work & pertinent test results  Airway Mallampati: II  TM Distance: >3 FB Neck ROM: Full    Dental  (+) Teeth Intact   Pulmonary former smoker,    breath sounds clear to auscultation       Cardiovascular negative cardio ROS   Rhythm:Regular Rate:Normal     Neuro/Psych negative neurological ROS  negative psych ROS   GI/Hepatic Neg liver ROS, hiatal hernia,   Endo/Other  negative endocrine ROS  Renal/GU negative Renal ROS  negative genitourinary   Musculoskeletal negative musculoskeletal ROS (+)   Abdominal   Peds negative pediatric ROS (+)  Hematology negative hematology ROS (+)   Anesthesia Other Findings   Reproductive/Obstetrics negative OB ROS                            Lab Results  Component Value Date   WBC 3.9 03/21/2015   HGB 11.4* 03/21/2015   HCT 35.0 03/21/2015   MCV 104.2* 03/21/2015   PLT 156 03/21/2015   Lab Results  Component Value Date   CREATININE 0.7 03/21/2015   BUN 10.2 03/21/2015   NA 141 03/21/2015   K 3.9 03/21/2015   CL 104 11/01/2014   CO2 25 03/21/2015   No results found for: INR, PROTIME   Anesthesia Physical Anesthesia Plan  ASA: II  Anesthesia Plan: General   Post-op Pain Management: GA combined w/ Regional for post-op pain   Induction: Intravenous  Airway Management Planned: LMA  Additional Equipment:   Intra-op Plan:   Post-operative Plan: Extubation in OR  Informed Consent: I have reviewed the patients History and Physical, chart, labs and discussed the procedure including the risks, benefits and alternatives for the proposed anesthesia with the patient or authorized representative who has indicated his/her understanding and acceptance.   Dental advisory given  Plan  Discussed with: CRNA  Anesthesia Plan Comments:         Anesthesia Quick Evaluation

## 2015-03-28 NOTE — Anesthesia Procedure Notes (Addendum)
Anesthesia Regional Block:  Pectoralis block  Pre-Anesthetic Checklist: ,, timeout performed, Correct Patient, Correct Site, Correct Laterality, Correct Procedure, Correct Position, site marked, Risks and benefits discussed,  Surgical consent,  Pre-op evaluation,  At surgeon's request and post-op pain management  Laterality: Right  Prep: chloraprep       Needles:  Injection technique: Single-shot  Needle Type: Stimiplex     Needle Length: 5cm 5 cm Needle Gauge: 22 and 22 G    Additional Needles:  Procedures: ultrasound guided (picture in chart) Pectoralis block  Nerve Stimulator or Paresthesia:  Response: biceps flexion,   Additional Responses:   Narrative:  Start time: 03/28/2015 8:16 AM End time: 03/28/2015 8:22 AM Injection made incrementally with aspirations every 5 mL.  Performed by: Personally  Anesthesiologist: Suella Broad D  Additional Notes: Functioning IV was confirmed and monitors were applied.  A 61mm 22ga Stimuplex needle was used. Sterile prep and drape,hand hygiene and sterile gloves were used.  Negative aspiration and negative test dose prior to incremental administration of local anesthetic. The patient tolerated the procedure well.      Procedure Name: LMA Insertion Date/Time: 03/28/2015 9:18 AM Performed by: Melynda Ripple D Pre-anesthesia Checklist: Patient identified, Emergency Drugs available, Suction available and Patient being monitored Patient Re-evaluated:Patient Re-evaluated prior to inductionOxygen Delivery Method: Circle System Utilized Preoxygenation: Pre-oxygenation with 100% oxygen Intubation Type: IV induction Ventilation: Mask ventilation without difficulty LMA: LMA inserted LMA Size: 3.0 Number of attempts: 1 Airway Equipment and Method: Bite block Placement Confirmation: positive ETCO2 Tube secured with: Tape Dental Injury: Teeth and Oropharynx as per pre-operative assessment

## 2015-03-28 NOTE — Op Note (Signed)
03/28/2015  10:55 AM  PATIENT:  Diana Dunn  69 y.o. female  PRE-OPERATIVE DIAGNOSIS:  Right breast cancer  POST-OPERATIVE DIAGNOSIS:  Right breast cancer  PROCEDURE:  Procedure(s): RADIOACTIVE SEED GUIDED PARTIAL MASTECTOMY WITH AXILLARY SENTINEL LYMPH NODE BIOPSY (Right)  SURGEON:  Surgeon(s) and Role:    * Jovita Kussmaul, MD - Primary  PHYSICIAN ASSISTANT:   ASSISTANTS: none   ANESTHESIA:   general  EBL:  Total I/O In: 1000 [I.V.:1000] Out: -   BLOOD ADMINISTERED:none  DRAINS: none   LOCAL MEDICATIONS USED:  MARCAINE     SPECIMEN:  Source of Specimen:  right breast tissue and sentinel node  DISPOSITION OF SPECIMEN:  PATHOLOGY  COUNTS:  YES  TOURNIQUET:  * No tourniquets in log *  DICTATION: .Dragon Dictation   After informed consent was obtained the patient was brought to the operating room and placed in the supine position on the operating room table. After adequate induction of general anesthesia the patient's right chest, breast, and axillary area were prepped with ChloraPrep, allowed to dry, and draped in usual sterile manner. Earlier in the day the patient underwent injection of 1 mCi of technetium sulfur colloid in the subareolar position on the right. Previously an I-125 seed was placed in the outer aspect of the right breast at the edge of the areola to mark an area of invasive breast cancer. The neoprobe was initially set to technetium and an area of increased radioactivity was identified in the right axilla. A small transversely oriented incision was made with a 15 blade knife overlying this area. The incision was carried through the skin and subcutaneous tissue sharply with electrocautery until the axilla was entered. A [place] retractor was deployed. Using the neoprobe to direct blunt hemostat dissection I was able to identify the lymph node with increased radioactivity. This lymph node was excised sharply with electrocautery and the lymphatics and  small vessels around it were controlled with clips. Ex vivo counts on this sentinel node were approximately 150. No other hot or palpable lymph nodes were identified in the right axilla. The area was infiltrated with quarter percent Marcaine. The deep layer of the incision was closed with interrupted 3-0 Vicryl stitches. The skin was then closed with a running 4-0 Monocryl subcuticular stitch. Attention was then turned to the right breast. A curvilinear incision was made along the lateral aspect of the areola of the right breast with a 15 blade knife. The incision was carried through the skin and subcutaneous tissue sharply with the electrocautery. While using the neoprobe set to I-125 to frequently checked the position of the radioactive seed a circular portion of breast tissue was excised sharply around the radioactive seed. Once the specimen was removed it was oriented with the appropriate paint colors. A specimen radiograph was obtained that showed the clip and seed to be in the center of the specimen. There was no residual I-125 radioactivity in the breast. The specimen was then sent to pathology for further evaluation. The cavity was marked with clips and hemostasis was achieved using the Bovie electrocautery. The breast tissue had to be mobilized off the chest wall posteriorly and away from the skin anteriorly in order to bring the breast tissue together to minimize the seroma cavity. The cavity was closed with layers of interrupted 3-0 Vicryl stitches. The skin was then closed with interrupted 4-0 Monocryl subcuticular stitches. Dermabond dressings were applied. Gauze dressings were then applied when the Dermabond was dry along with a breast  binder. The patient tolerated the procedure well. At the end of the case all needle sponge and instrument counts were correct. The patient was then awakened and taken to recovery in stable condition.  PLAN OF CARE: Discharge to home after PACU  PATIENT DISPOSITION:   PACU - hemodynamically stable.   Delay start of Pharmacological VTE agent (>24hrs) due to surgical blood loss or risk of bleeding: not applicable

## 2015-03-28 NOTE — Anesthesia Postprocedure Evaluation (Addendum)
  Anesthesia Post-op Note  Patient: Diana Dunn  Procedure(s) Performed: Procedure(s): RADIOACTIVE SEED GUIDED PARTIAL MASTECTOMY WITH AXILLARY SENTINEL LYMPH NODE BIOPSY (Right)  Patient Location: PACU  Anesthesia Type:GA combined with regional for post-op pain  Level of Consciousness: awake, alert  and oriented  Airway and Oxygen Therapy: Patient Spontanous Breathing  Post-op Pain: none  Post-op Assessment: Post-op Vital signs reviewed and Patient's Cardiovascular Status Stable              Post-op Vital Signs: Reviewed and stable  Last Vitals:  Filed Vitals:   03/28/15 1215  BP: 139/63  Pulse: 63  Temp: 36.4 C  Resp: 20    Complications: No apparent anesthesia complications

## 2015-03-28 NOTE — Progress Notes (Signed)
Assisted Dr. Smith Robert with right, ultrasound guided, pectoralis block. Side rails up, monitors on throughout procedure. See vital signs in flow sheet. Tolerated Procedure well.

## 2015-03-28 NOTE — H&P (Signed)
Diana Dunn 03/14/2015 4:00 PM Location: [facility] Patient #: [id number] DOB: December 21, 1945 Widowed / Language: Diana Dunn / Race: White Female  History of Present Illness [name]. [name] MD; 03/14/2015 4:43 PM) Patient words: breast f/u.  The patient is a 69 year old female who presents for a follow-up for Breast cancer. The patient is a 69 year old white female who originally had a 3 cm cancer in the outer aspect of the right breast. She was a triple positive with a Ki-67 of 20%. She received neoadjuvant chemotherapy and has tolerated it relatively well. She has definitely had a response to the treatment. She returns today to talk about the options for treatment again and then proceed with surgery scheduling   Allergies (Sonya Bynum, CMA; 03/14/2015 4:00 PM) Aspirin EC *ANALGESICS - NonNarcotic*  Medication History (Sonya Bynum, CMA; 03/14/2015 4:01 PM) Decadron (4MG Tablet, Oral) Active. Diflucan (100MG Tablet, Oral) Active. Anaspaz (0.125MG Tablet Disperse, Oral) Active. Levsin (0.125MG Tablet, Oral) Active. Lidocaine HCl (2% Gel, External) Active. Ativan (0.5MG Tablet, Oral) Active. Zofran (8MG Tablet, Oral) Active. Compazine (10MG Tablet, Oral) Active. Medications Reconciled  Review of Systems [name] S. [name] MD; 03/14/2015 4:43 PM) Skin Not Present- Change in Wart/Mole, Dryness, Hives, Jaundice, New Lesions, Non-Healing Wounds, Rash and Ulcer. HEENT Not Present- Earache, Hearing Loss, Hoarseness, Nose Bleed, Oral Ulcers, Ringing in the Ears, Seasonal Allergies, Sinus Pain, Sore Throat, Visual Disturbances, Wears glasses/contact lenses and Yellow Eyes. Respiratory Not Present- Bloody sputum, Chronic Cough, Difficulty Breathing, Snoring and Wheezing. Breast Present- Breast Mass. Not Present- Breast Pain, Nipple Discharge and Skin Changes. Cardiovascular Not Present- Chest Pain, Difficulty Breathing Lying Down, Leg Cramps, Palpitations, Rapid Heart Rate,  Shortness of Breath and Swelling of Extremities. Gastrointestinal Present- Change in Bowel Habits, Indigestion and Nausea. Not Present- Abdominal Pain, Bloating, Bloody Stool, Chronic diarrhea, Constipation, Difficulty Swallowing, Excessive gas, Gets full quickly at meals, Hemorrhoids, Rectal Pain and Vomiting. Female Genitourinary Not Present- Frequency, Nocturia, Painful Urination, Pelvic Pain and Urgency. Musculoskeletal Not Present- Back Pain, Joint Pain, Joint Stiffness, Muscle Pain, Muscle Weakness and Swelling of Extremities. Neurological Not Present- Decreased Memory, Fainting, Headaches, Numbness, Seizures, Tingling, Tremor, Trouble walking and Weakness. Psychiatric Not Present- Anxiety, Bipolar, Change in Sleep Pattern, Depression, Fearful and Frequent crying. Endocrine Not Present- Cold Intolerance, Excessive Hunger, Hair Changes, Heat Intolerance, Hot flashes and New Diabetes. Hematology Not Present- Easy Bruising, Excessive bleeding, Gland problems, HIV and Persistent Infections.   Vitals (Sonya Bynum CMA; 03/14/2015 4:00 PM) 03/14/2015 4:00 PM Weight: 111 lb Height: 66in Body Surface Area: 1.53 m Body Mass Index: 17.92 kg/m Temp.: 73F(Temporal)  Pulse: 76 (Regular)  BP: 120/78 (Sitting, Left Arm, Standard)    Physical Exam [name] S. [name] MD; 03/14/2015 4:43 PM) General Mental Status-Alert. General Appearance-Consistent with stated age. Hydration-Well hydrated. Voice-Normal.  Head and Neck Head-normocephalic, atraumatic with no lesions or palpable masses. Trachea-midline. Thyroid Gland Characteristics - normal size and consistency.  Eye Eyeball - Bilateral-Extraocular movements intact. Sclera/Conjunctiva - Bilateral-No scleral icterus.  Chest and Lung Exam Chest and lung exam reveals -quiet, even and easy respiratory effort with no use of accessory muscles and on auscultation, normal breath sounds, no adventitious sounds and normal vocal  resonance. Inspection Chest Wall - Normal. Back - normal.  Breast Note: There is a 2 cm palpable fullness in the lateral right breast near the edge of the areolar. There is no palpable axillary, supraclavicular, or cervical lymphadenopathy.   Cardiovascular Cardiovascular examination reveals -normal heart sounds, regular rate and rhythm with no  murmurs and normal pedal pulses bilaterally.  Abdomen Inspection Inspection of the abdomen reveals - No Hernias. Skin - Scar - no surgical scars. Palpation/Percussion Palpation and Percussion of the abdomen reveal - Soft, Non Tender, No Rebound tenderness, No Rigidity (guarding) and No hepatosplenomegaly. Auscultation Auscultation of the abdomen reveals - Bowel sounds normal.  Neurologic Neurologic evaluation reveals -alert and oriented x 3 with no impairment of recent or remote memory. Mental Status-Normal.  Musculoskeletal Normal Exam - Left-Upper Extremity Strength Normal and Lower Extremity Strength Normal. Normal Exam - Right-Upper Extremity Strength Normal and Lower Extremity Strength Normal.  Lymphatic Head & Neck  General Head & Neck Lymphatics: Bilateral - Description - Normal. Axillary  General Axillary Region: Bilateral - Description - Normal. Tenderness - Non Tender. Femoral & Inguinal  Generalized Femoral & Inguinal Lymphatics: Bilateral - Description - Normal. Tenderness - Non Tender.    Assessment & Plan [name] S. 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 She is now ready to schedule surgery. I have talked her in detail about the different options for treatment and at this point she favors breast conservation. I think this is a very reasonable way of treating her breast cancer. I have discussed with her in detail the risks and  benefits of the operation to remove the cancer as well as some of the technical aspects and she understands and wishes to proceed. I will plan for a right breast radioactive seed localized lumpectomy and sentinel node mapping.     Signed by Luella Cook, MD (03/14/2015 4:43 PM)

## 2015-03-28 NOTE — Interval H&P Note (Signed)
History and Physical Interval Note:  03/28/2015 8:28 AM  Diana Dunn  has presented today for surgery, with the diagnosis of RIGHT BREAST CANCER  The various methods of treatment have been discussed with the patient and family. After consideration of risks, benefits and other options for treatment, the patient has consented to  Procedure(s): RADIOACTIVE SEED GUIDED PARTIAL MASTECTOMY WITH AXILLARY SENTINEL LYMPH NODE BIOPSY (Right) as a surgical intervention .  The patient's history has been reviewed, patient examined, no change in status, stable for surgery.  I have reviewed the patient's chart and labs.  Questions were answered to the patient's satisfaction.     TOTH III,PAUL S

## 2015-03-28 NOTE — Progress Notes (Signed)
nuc med injection performed by radiology staff. Additional fentanyl given after first injection (uncomfortable for pt) and remaining injections more tolerable after additional med given. Emotional support provided. Will call carol (sis in law) to bedside and update.

## 2015-03-28 NOTE — Discharge Instructions (Signed)

## 2015-03-28 NOTE — Transfer of Care (Signed)
Immediate Anesthesia Transfer of Care Note  Patient: Diana Dunn  Procedure(s) Performed: Procedure(s): RADIOACTIVE SEED GUIDED PARTIAL MASTECTOMY WITH AXILLARY SENTINEL LYMPH NODE BIOPSY (Right)  Patient Location: PACU  Anesthesia Type:GA combined with regional for post-op pain  Level of Consciousness: sedated  Airway & Oxygen Therapy: Patient Spontanous Breathing and Patient connected to face mask oxygen  Post-op Assessment: Report given to RN and Post -op Vital signs reviewed and stable  Post vital signs: Reviewed and stable  Last Vitals:  Filed Vitals:   03/28/15 0900  BP:   Pulse: 62  Temp:   Resp: 16    Complications: No apparent anesthesia complications

## 2015-03-29 ENCOUNTER — Encounter (HOSPITAL_BASED_OUTPATIENT_CLINIC_OR_DEPARTMENT_OTHER): Payer: Self-pay | Admitting: General Surgery

## 2015-03-29 NOTE — Addendum Note (Signed)
Addendum  created 03/29/15 2589 by Ernesta Amble Julyan Gales, CRNA   Modules edited: Charges VN

## 2015-04-09 NOTE — Progress Notes (Signed)
Location of Breast Cancer: Right Breast  Histology per Pathology Report:   03/28/2015 Diagnosis 1. Lymph node, sentinel, biopsy, Right axillary #1 - ONE BENIGN LYMPH NODE WITH NO TUMOR SEEN (0/1). 2. Breast, lumpectomy, Right - INVASIVE GRADE II DUCTAL CARCINOMA, SPANNING 2 CM IN GREATEST DIMENSION. 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 - INVASIVE TUMOR IS CLOSE TO INFERIOR / ANTERIOR MARGIN (<0.1 CM) BUT MARGINS ARE NEGATIVE.  Receptor Status: ER(POS +), PR (POS +), Her2-neu (POS +)  Did patient present with symptoms (if so, please note symptoms) or was this found on screening mammography?:    Approximately mid April, she again developed redness over the right breast. This was very focal, and it did not look "as red" as the previous time. She brought it to Dr. Ross is attention, and was treated with antibiotics. Bilateral mammography with tomosynthesis and right breast ultrasonography was also obtained on 09/19/2014. There was a focal opacity with irregular margins in the upper outer right breast with some central calcifications. There was mild architectural distortion associated with this. On exam Dr. Ormond noted a firm palpable tender mass lateral to the right areola with overlying erythema. On ultrasonography there was an irregular hypoechoic mass measuring 2.4 cm.  Repeat right breast ultrasonography after the patient completed her antibiotic course was performed 10/04/2014. On exam there was still a palpable lobulated mass lateral to the right nipple and by ultrasound this measured 2.6 cm. It was felt to be a possible abscess and a second course of antibiotics, now with Septra, was undertaken. After that treatment, repeat ultrasonography 10/16/2014 continued to show the palpable mass and ultrasonography now found the mass to measure 2.9 cm maximally.  Accordingly biopsy of the mass in question was obtained that same day.  Past/Anticipated interventions by surgeon, if any:  RADIOACTIVE SEED GUIDED PARTIAL MASTECTOMY WITH AXILLARY SENTINEL LYMPH NODE BIOPSY (Right), 03/28/2015 per Dr. 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   Lymphedema issues, if any:  No  Pain issues, if any: She complains of pain in her surgical site, states it is more sore than painful. She is taking tylenol for the soreness with relief.   SAFETY ISSUES:  Prior radiation? No  Pacemaker/ICD? No  Possible current pregnancy? No  Is the patient on methotrexate? No  Current Complaints / other details:  She has two scars from her surgery. The right side of her nipple, and her axilla area. The incisions are covered in dermabond, with a normal healing appearance.     Malmfelt, Jennifer L, RN 04/09/2015,2:39 PM   

## 2015-04-11 ENCOUNTER — Ambulatory Visit (HOSPITAL_BASED_OUTPATIENT_CLINIC_OR_DEPARTMENT_OTHER): Payer: Medicare Other

## 2015-04-11 ENCOUNTER — Other Ambulatory Visit (HOSPITAL_BASED_OUTPATIENT_CLINIC_OR_DEPARTMENT_OTHER): Payer: Medicare Other

## 2015-04-11 ENCOUNTER — Encounter: Payer: Self-pay | Admitting: *Deleted

## 2015-04-11 ENCOUNTER — Telehealth: Payer: Self-pay

## 2015-04-11 VITALS — BP 143/63 | HR 61 | Temp 97.7°F | Resp 20

## 2015-04-11 DIAGNOSIS — Z5112 Encounter for antineoplastic immunotherapy: Secondary | ICD-10-CM | POA: Diagnosis not present

## 2015-04-11 DIAGNOSIS — C50411 Malignant neoplasm of upper-outer quadrant of right female breast: Secondary | ICD-10-CM

## 2015-04-11 DIAGNOSIS — C50811 Malignant neoplasm of overlapping sites of right female breast: Secondary | ICD-10-CM

## 2015-04-11 LAB — CBC WITH DIFFERENTIAL/PLATELET
BASO%: 0.2 % (ref 0.0–2.0)
BASOS ABS: 0 10*3/uL (ref 0.0–0.1)
EOS ABS: 0 10*3/uL (ref 0.0–0.5)
EOS%: 0.2 % (ref 0.0–7.0)
HCT: 39.6 % (ref 34.8–46.6)
HEMOGLOBIN: 12.9 g/dL (ref 11.6–15.9)
LYMPH%: 24.2 % (ref 14.0–49.7)
MCH: 33.4 pg (ref 25.1–34.0)
MCHC: 32.6 g/dL (ref 31.5–36.0)
MCV: 102.6 fL — AB (ref 79.5–101.0)
MONO#: 0.4 10*3/uL (ref 0.1–0.9)
MONO%: 7.8 % (ref 0.0–14.0)
NEUT#: 3 10*3/uL (ref 1.5–6.5)
NEUT%: 67.6 % (ref 38.4–76.8)
Platelets: 226 10*3/uL (ref 145–400)
RBC: 3.86 10*6/uL (ref 3.70–5.45)
RDW: 12.5 % (ref 11.2–14.5)
WBC: 4.5 10*3/uL (ref 3.9–10.3)
lymph#: 1.1 10*3/uL (ref 0.9–3.3)

## 2015-04-11 LAB — COMPREHENSIVE METABOLIC PANEL (CC13)
ALBUMIN: 4 g/dL (ref 3.5–5.0)
ALK PHOS: 78 U/L (ref 40–150)
ALT: 17 U/L (ref 0–55)
AST: 16 U/L (ref 5–34)
Anion Gap: 8 mEq/L (ref 3–11)
BUN: 9.7 mg/dL (ref 7.0–26.0)
CALCIUM: 9.9 mg/dL (ref 8.4–10.4)
CHLORIDE: 106 meq/L (ref 98–109)
CO2: 26 mEq/L (ref 22–29)
Creatinine: 0.7 mg/dL (ref 0.6–1.1)
EGFR: 85 mL/min/{1.73_m2} — AB (ref >90)
Glucose: 80 mg/dl (ref 70–140)
POTASSIUM: 3.8 meq/L (ref 3.5–5.1)
SODIUM: 141 meq/L (ref 136–145)
Total Bilirubin: 0.43 mg/dL (ref 0.20–1.20)
Total Protein: 6.4 g/dL (ref 6.4–8.3)

## 2015-04-11 MED ORDER — DIPHENHYDRAMINE HCL 25 MG PO CAPS
ORAL_CAPSULE | ORAL | Status: AC
  Filled 2015-04-11: qty 1

## 2015-04-11 MED ORDER — DIPHENHYDRAMINE HCL 25 MG PO CAPS
25.0000 mg | ORAL_CAPSULE | Freq: Once | ORAL | Status: AC
  Administered 2015-04-11: 25 mg via ORAL

## 2015-04-11 MED ORDER — HEPARIN SOD (PORK) LOCK FLUSH 100 UNIT/ML IV SOLN
500.0000 [IU] | Freq: Once | INTRAVENOUS | Status: AC | PRN
  Administered 2015-04-11: 500 [IU]
  Filled 2015-04-11: qty 5

## 2015-04-11 MED ORDER — ACETAMINOPHEN 325 MG PO TABS
650.0000 mg | ORAL_TABLET | Freq: Once | ORAL | Status: AC
  Administered 2015-04-11: 650 mg via ORAL

## 2015-04-11 MED ORDER — SODIUM CHLORIDE 0.9 % IJ SOLN
10.0000 mL | INTRAMUSCULAR | Status: DC | PRN
  Administered 2015-04-11: 10 mL
  Filled 2015-04-11: qty 10

## 2015-04-11 MED ORDER — SODIUM CHLORIDE 0.9 % IV SOLN
Freq: Once | INTRAVENOUS | Status: AC
  Administered 2015-04-11: 11:00:00 via INTRAVENOUS

## 2015-04-11 MED ORDER — ACETAMINOPHEN 325 MG PO TABS
ORAL_TABLET | ORAL | Status: AC
  Filled 2015-04-11: qty 2

## 2015-04-11 MED ORDER — TRASTUZUMAB CHEMO INJECTION 440 MG
6.0000 mg/kg | Freq: Once | INTRAVENOUS | Status: AC
  Administered 2015-04-11: 294 mg via INTRAVENOUS
  Filled 2015-04-11: qty 14

## 2015-04-11 NOTE — Patient Instructions (Signed)
[facility] Discharge Instructions for Patients Receiving Chemotherapy  Today you received the following chemotherapy agents Herceptin.  To help prevent nausea and vomiting after your treatment, we encourage you to take your nausea medication as prescribed by your physician. If you develop nausea and vomiting that is not controlled by your nausea medication, call the clinic.   BELOW ARE SYMPTOMS THAT SHOULD BE REPORTED IMMEDIATELY:  *FEVER GREATER THAN 100.5 F  *CHILLS WITH OR WITHOUT FEVER  NAUSEA AND VOMITING THAT IS NOT CONTROLLED WITH YOUR NAUSEA MEDICATION  *UNUSUAL SHORTNESS OF BREATH  *UNUSUAL BRUISING OR BLEEDING  TENDERNESS IN MOUTH AND THROAT WITH OR WITHOUT PRESENCE OF ULCERS  *URINARY PROBLEMS  *BOWEL PROBLEMS  UNUSUAL RASH Items with * indicate a potential emergency and should be followed up as soon as possible.  Feel free to call the clinic you have any questions or concerns. The clinic phone number is [phone].  Please show the CHEMO ALERT CARD at check-in to the Emergency Department and triage nurse.   

## 2015-04-12 ENCOUNTER — Encounter: Payer: Self-pay | Admitting: Radiation Oncology

## 2015-04-12 ENCOUNTER — Ambulatory Visit
Admission: RE | Admit: 2015-04-12 | Discharge: 2015-04-12 | Disposition: A | Discharge status: Home / Self Care | Payer: Medicare Other | Source: Ambulatory Visit | Attending: Radiation Oncology | Admitting: Radiation Oncology

## 2015-04-12 VITALS — BP 131/76 | HR 54 | Temp 97.8°F | Ht 66.0 in | Wt 114.2 lb

## 2015-04-12 DIAGNOSIS — C50411 Malignant neoplasm of upper-outer quadrant of right female breast: Secondary | ICD-10-CM | POA: Diagnosis not present

## 2015-04-12 DIAGNOSIS — Z510 Encounter for antineoplastic radiation therapy: Secondary | ICD-10-CM | POA: Insufficient documentation

## 2015-04-12 NOTE — Addendum Note (Signed)
Encounter addended by: Ernst Spell, RN on: 04/12/2015  1:05 PM<BR>     Documentation filed: Arn Medal VN

## 2015-04-12 NOTE — Addendum Note (Signed)
Encounter addended by: Ernst Spell, RN on: 04/12/2015  1:01 PM<BR>     Documentation filed: Charges VN

## 2015-04-12 NOTE — Progress Notes (Signed)
Radiation Oncology         [phone] ________________________________  Name: Diana Dunn MRN: [id number]  Date: 04/12/2015  DOB: 12/02/1945  Follow-Up Visit Note  Outpatient  CC: Harle Battiest, MD  Jovita Kussmaul, MD  Diagnosis:      ICD-9-CM ICD-10-CM   1. 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     Since consultation, she underwent neoadjuvant chemotherapy with Herceptin. She underwent lumpectomy and sentinel lymph node biopsy by Dr. [name] on 03/28/15. The single lymph node removed was negative. The right breast tumor was 2.0 cm, with a close inferior/interior margin less than 0.1 cm to invasive disease, but margins were negative. In situ disease margin was negative by 0.4 cm. Her disease was Grade II on final pathology.  The patient was discussed at breast tumor board this week. Agreement was that her margins were close, but acceptable.  The patient reports right breast pain, but describes this more as a soreness. She is taking tylenol for this with relief.  ALLERGIES:  is allergic to aspirin and aspirin.  Meds: Current Outpatient Prescriptions  Medication Sig Dispense Refill  . lidocaine-prilocaine (EMLA) cream Apply 1 application topically as needed. 30 g 0  . oxyCODONE-acetaminophen (ROXICET) 5-325 MG tablet Take 1-2 tablets by mouth every 4 (four) hours as needed. (Patient not taking: Reported on 04/12/2015) 50 tablet 0  . tobramycin-dexamethasone (TOBRADEX) ophthalmic solution Place 1 drop into both eyes every 4 (four) hours while awake. (Patient not taking: Reported on 04/12/2015) 5 mL 0   No current facility-administered medications for this encounter.    Physical  Findings:  height is 5' 6"  (1.676 m) and weight is 114 lb 3.2 oz (51.801 kg). Her temperature is 97.8 F (36.6 C). Her blood pressure is 131/76 and her pulse is 54. .     General: Alert and oriented, in no acute distress HEENT: Head is normocephalic. alopecia. Neck: Neck is supple, no palpable cervical or supraclavicular lymphadenopathy. Heart: Regular in rate and rhythm with no murmurs, rubs, or gallops. Chest: Clear to auscultation bilaterally, with no rhonchi, wheezes, or rales.  Extremities: No cyanosis or edema. Lymphatics: see Neck Exam  Neurologic: No obvious focalities. Speech is fluent.  Psychiatric: Judgment and insight are intact. Affect is appropriate. Breast exam reveals seroma under right lumpectomy scar, healing well.  Lab Findings: Lab Results  Component Value Date   WBC 4.5 04/11/2015   HGB 12.9 04/11/2015   HCT 39.6 04/11/2015   MCV 102.6* 04/11/2015   PLT 226 04/11/2015    Radiographic Findings: Nm Sentinel Node Inj-no Rpt (breast)  03/28/2015  CLINICAL DATA: right breast cancer Sulfur colloid was injected intradermally by the nuclear medicine technologist for breast cancer sentinel node localization.   Mm Breast Surgical Specimen  03/28/2015  CLINICAL DATA:  Right lumpectomy for invasive ductal carcinoma. EXAM: SPECIMEN RADIOGRAPH OF THE RIGHT BREAST COMPARISON:  Previous exam(s). FINDINGS: Status post excision of the right breast. The radioactive seed and biopsy marker clip are present, completely intact, and were marked for pathology. There is also a spiculated mass within the specimen, containing seed and clip. IMPRESSION: Specimen radiograph of the right breast. Electronically Signed   By: Percell Locus.D.  On: 03/28/2015 10:27   Mm Rt Radioactive Seed Loc Mammo Guide  03/25/2015  CLINICAL DATA:  Status post neoadjuvant chemotherapy for a right breast invasive ductal carcinoma. Preoperative radioactive seed localization. EXAM: MAMMOGRAPHIC GUIDED  RADIOACTIVE SEED LOCALIZATION OF THE RIGHT BREAST COMPARISON:  Previous exam(s). FINDINGS: Patient presents for radioactive seed localization prior to right lumpectomy. I met with the patient and we discussed the procedure of seed localization including benefits and alternatives. We discussed the high likelihood of a successful procedure. We discussed the risks of the procedure including infection, bleeding, tissue injury and further surgery. We discussed the low dose of radioactivity involved in the procedure. Informed, written consent was given. The usual time-out protocol was performed immediately prior to the procedure. Using mammographic guidance, sterile technique, 2% lidocaine and an I-125 radioactive seed, the previously placed coil shaped biopsy marker clip in the upper-outer right breast was localized using a lateral approach. The follow-up mammogram images confirm the seed in the expected location and were marked for Dr. Harrington Challenger. Follow-up survey of the patient confirms presence of the radioactive seed. Order number of I-125 seed:  [id number]. Total activity:  0.255 mCi  Reference Date: 02/21/2015 The patient tolerated the procedure well and was released from the [facility]. She was given instructions regarding seed removal. IMPRESSION: Radioactive seed localization right breast. No apparent complications. Electronically Signed   By: Claudie Revering M.D.   On: 03/25/2015 15:00    Impression/Plan: We discussed adjuvant radiotherapy today.  I recommend radiation to her right breast in order to reduce her risk of locoregional recurrence by two-thirds.  The risks, benefits and side effects of this treatment were discussed in detail.  She understands that radiotherapy is associated with skin irritation and fatigue in the acute setting. Late effects can include cosmetic changes and rare injury to internal organs.   She is enthusiastic about proceeding with treatment. A consent form has been   signed and placed  in her chart. Simulation for 04-24-15.  I spent 30 minutes minutes face to face with the patient and more than 50% of that time was spent in counseling and/or coordination of care.  This document serves as a record of services personally performed by Eppie Gibson, MD. It was created on her behalf by Darcus Austin, a trained medical scribe. The creation of this record is based on the scribe's personal observations and the provider's statements to them. This document has been checked and approved by the attending provider.    _____________________________________   Eppie Gibson, MD

## 2015-04-22 DIAGNOSIS — Z23 Encounter for immunization: Secondary | ICD-10-CM | POA: Diagnosis not present

## 2015-04-24 ENCOUNTER — Ambulatory Visit
Admission: RE | Admit: 2015-04-24 | Discharge: 2015-04-24 | Disposition: A | Discharge status: Home / Self Care | Payer: Medicare Other | Source: Ambulatory Visit | Attending: Radiation Oncology | Admitting: Radiation Oncology

## 2015-04-24 DIAGNOSIS — Z510 Encounter for antineoplastic radiation therapy: Secondary | ICD-10-CM | POA: Diagnosis not present

## 2015-04-24 DIAGNOSIS — C50411 Malignant neoplasm of upper-outer quadrant of right female breast: Secondary | ICD-10-CM

## 2015-04-24 NOTE — Progress Notes (Signed)
  Radiation Oncology         [phone] ________________________________  Name: Diana Dunn MRN: [id number]  Date: 04/24/2015  DOB: Apr 01, 1946  SIMULATION AND TREATMENT PLANNING NOTE    Outpatient  DIAGNOSIS:     ICD-9-CM ICD-10-CM   1. Breast cancer of upper-outer quadrant of right female breast ([employer]) 174.4 C50.411     NARRATIVE:  The patient was brought to the [facility].  Identity was confirmed.  All relevant records and images related to the planned course of therapy were reviewed.  The patient freely provided informed written consent to proceed with treatment after reviewing the details related to the planned course of therapy. The consent form was witnessed and verified by the simulation staff.    Then, the patient was set-up in a stable reproducible supine position for radiation therapy with her ipsilateral arm over her head, and her upper body secured in a custom-made Vac-lok device.  CT images were obtained.  Surface markings were placed.  The CT images were loaded into the planning software.    TREATMENT PLANNING NOTE: Treatment planning then occurred.  The radiation prescription was entered and confirmed.     A total of 3 medically necessary complex treatment devices were fabricated and supervised by me: 2 fields with MLCs for custom blocks to protect heart, and lungs;  and, a Vac-lok. MORE COMPLEX DEVICES MAY BE MADE IN DOSIMETRY FOR FIELD IN FIELD BEAMS FOR DOSE HOMOGENEITY.  I have requested : 3D Simulation  I have requested a DVH of the following structures: lungs, heart, lumpectomy cavity.    The patient will receive 50 Gy in 25 fractions to the right breast with 2 tangential fields.   This will be followed by a boost.  Optical Surface Tracking Plan:  Since intensity modulated radiotherapy (IMRT) and 3D conformal radiation treatment methods are predicated on accurate and precise positioning for treatment, intrafraction motion monitoring is  medically necessary to ensure accurate and safe treatment delivery. The ability to quantify intrafraction motion without excessive ionizing radiation dose can only be performed with optical surface tracking. Accordingly, surface imaging offers the opportunity to obtain 3D measurements of patient position throughout IMRT and 3D treatments without excessive radiation exposure. I am ordering optical surface tracking for this patient's upcoming course of radiotherapy.  ________________________________   Reference:  Ursula Alert, J, et al. Surface imaging-based analysis of intrafraction motion for breast radiotherapy patients.Journal of [place], n. 6, nov. 2014. ISSN [id number].  Available at: <http://www.jacmp.org/index.php/jacmp/article/view/4957>.    -----------------------------------  Eppie Gibson, MD

## 2015-04-26 ENCOUNTER — Ambulatory Visit (HOSPITAL_BASED_OUTPATIENT_CLINIC_OR_DEPARTMENT_OTHER): Payer: Medicare Other | Admitting: Oncology

## 2015-04-26 VITALS — BP 145/68 | HR 64 | Temp 97.5°F | Resp 18 | Ht 66.0 in | Wt 111.4 lb

## 2015-04-26 DIAGNOSIS — C50411 Malignant neoplasm of upper-outer quadrant of right female breast: Secondary | ICD-10-CM

## 2015-04-26 DIAGNOSIS — Z170 Estrogen receptor positive status [ER+]: Secondary | ICD-10-CM

## 2015-04-26 NOTE — Progress Notes (Signed)
[facility]  [phone] [phone]     ID: Diana Dunn DOB: 1945/09/15  MR#: [id number]  UYQ#:[id number]  Patient Care Team: Harle Battiest, MD as PCP - General (Obstetrics and Gynecology) Autumn Messing III, MD as Consulting Physician (General Surgery) Chauncey Cruel, MD as Consulting Physician (Oncology) Eppie Gibson, MD as Attending Physician (Radiation Oncology) Mauro Kaufmann, RN as Registered Nurse Rockwell Germany, RN as Registered Nurse Holley Bouche, NP as Nurse Practitioner (Nurse Practitioner) Lona Kettle, MD (Family Medicine) PCP: Harle Battiest, MD OTHER MD:  CHIEF COMPLAINT: Triple positive breast cancer  CURRENT TREATMENT: anti-HER-2 immunotherapy  BREAST CANCER HISTORY: From the original intake note:  Sun had not had a mammogram for approximately 4 years. Sometime mid 2015 she had an area of redness and tenderness in the right breast and she brought this to her physician's attention. She was treated with antibiotics and this completely cleared.Marland Kitchen  Approximately mid April, she again developed redness over the right breast. This was very focal, and it did not look "as red" as the previous time. She brought it to Dr. Harrington Challenger is attention, and was treated with antibiotics. Bilateral mammography with tomosynthesis and right breast ultrasonography was also obtained on 09/19/2014. There was a focal opacity with irregular margins in the upper outer right breast with some central calcifications. There was mild architectural distortion associated with this. On exam Dr. [name] noted a firm palpable tender mass lateral to the right areola with overlying erythema. On ultrasonography there was an irregular hypoechoic mass measuring 2.4 cm.  Repeat right breast ultrasonography after the patient completed her antibiotic course was performed 10/04/2014. On exam there was still a palpable lobulated mass lateral to the right nipple and by ultrasound  this measured 2.6 cm. It was felt to be a possible abscess and a second course of antibiotics, now with Septra, was undertaken. After that treatment, repeat ultrasonography 10/16/2014 continued to show the palpable mass and ultrasonography now found the mass to measure 2.9 cm maximally.  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  Bilateral breast MRI 10/23/2014 showed a breast density to be category B. In the right breast there was an irregular enhancing mass at the 9:30 o'clock position measuring 3.1 cm. There were no additional worrisome masses in the right breast, no findings of concern in the left breast, and no abnormal appearing lymph nodes.  The patient's subsequent history is as detailed below  INTERVAL HISTORY: Diana Dunn returns today for follow-up of her HER-2 positive breast cancer. Since her last surgery here she underwent right lumpectomy and sentinel lymph node sampling, on 03/28/2015. The final pathology ([id number]) showed invasive ductal carcinoma, grade 2, measuring 2 cm. The single sentinel lymph node was clear. Margins were close but negative. --She did well with the surgery, with some soreness in initially, but no swelling, erythema, or fever. She now has more stamina, going up and down stairs all the time, generally feels good, and her sense of taste is much improved. For a bonus Diana Dunn was born a week early at 7.4 pounds. Both Diana Dunn and Kountze doing well.  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 She is somewhat anxious at times, but not depressed. A detailed review  of systems today was otherwise stable  PAST MEDICAL HISTORY: Past Medical History  Diagnosis Date  . Breast cancer of upper-outer quadrant of right female breast ([place]) 10/19/2014  . Breast cancer ([place])   . History of hiatal hernia   . IBS (irritable bowel syndrome)   . Lactose intolerance   . Cancer [facility])     RIGHT BREAST CANCER  Irritable bowel symptoms  PAST SURGICAL HISTORY: Past Surgical History  Procedure Laterality Date  . Breast surgery      BIOPSY OF BREAST  . Colonoscopy    . Portacath placement Left 11/02/2014    Procedure: INSERTION PORT-A-CATH;  Surgeon: Autumn Messing III, MD;  Location: [facility];  Service: General;  Laterality: Left;  . Radioactive seed guided mastectomy with axillary sentinel lymph node biopsy Right 03/28/2015    Procedure: RADIOACTIVE SEED GUIDED PARTIAL MASTECTOMY WITH AXILLARY SENTINEL LYMPH NODE BIOPSY;  Surgeon: Autumn Messing III, MD;  Location: [facility];  Service: General;  Laterality: Right;    FAMILY HISTORY No family history on file. The patient's father died at the age of 67 from a myocardial infarction. The patient's mother died at the age of 67 following a stroke. The patient had 2 brothers. One had Down's syndrome. She had no sisters. The only breast cancer in the family was the patient's father's only sister was diagnosed with breast cancer in her 24s. There is no history of ovarian cancer in the family to the patient's knowledge   GYNECOLOGIC HISTORY:  No LMP recorded. Patient is postmenopausal.  menarche age 69, first live birth age 69, she is [place] P2. She went through the change of life in late 1999. She did not take hormone replacement. She took birth control pills for less than 2 years in her 50K, with no complications   SOCIAL HISTORY:  Cyann is my neighbor. 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 Bill died from widely metastatic malignant melanoma within 4 months of diagnosis some years  ago. The patient lives with her [name] and his wife, who is expecting their first child late November 2016. The other son, [name], lives in [place]. Both sons are appraised her's. The patient has 2 grandchildren and as just noted one on the way. The patient attends the [employer]: Not in place. At the 10/24/2014 visit the patient was given the appropriate documents 2 complete and notarize at her discretion   HEALTH MAINTENANCE: Social History  Substance Use Topics  . Smoking status: Former Smoker    Types: Cigarettes    Quit date: 08/03/2014  . Smokeless tobacco: Not on file  . Alcohol Use: 3.6 oz/week    6 Glasses of wine per week     Colonoscopy: 2000?  PAP:  Bone density: On wrist, remote   Lipid panel:  Allergies  Allergen Reactions  . Aspirin     Stomach pain  . Aspirin Nausea Only    GI upset/pain    Current Outpatient Prescriptions  Medication Sig Dispense Refill  . lidocaine-prilocaine (EMLA) cream Apply 1 application topically as needed. 30 g 0  . oxyCODONE-acetaminophen (ROXICET) 5-325 MG tablet Take 1-2 tablets by mouth every 4 (four) hours as needed. (Patient not taking: Reported on 04/12/2015) 50 tablet 0  . tobramycin-dexamethasone (TOBRADEX) ophthalmic solution  Place 1 drop into both eyes every 4 (four) hours while awake. (Patient not taking: Reported on 04/12/2015) 5 mL 0   No current facility-administered medications for this visit.    OBJECTIVE: middle-aged white woman in no acute distress Filed Vitals:   04/26/15 1256  BP: 145/68  Pulse: 64  Temp: 97.5 F (36.4 C)  Resp: 18     Body mass index is 17.99 kg/(m^2).    ECOG FS:1 - Symptomatic but completely ambulatory Filed Weights   04/26/15 1256  Weight: 111 lb 6.4 oz (50.531 kg)   baseline weight 122 pounds  Hair is just beginning to grow back. Sclerae unicteric, EOMs intact Oropharynx clear, dentition in good repair No cervical or supraclavicular  adenopathy Lungs no rales or rhonchi Heart regular rate and rhythm Abd soft, nontender, positive bowel sounds MSK no focal spinal tenderness, no upper extremity lymphedema Neuro: nonfocal, well oriented, appropriate affect Breasts: The right breast is status post recent lumpectomy. The cosmetic result is good. There is some distortion of the breast contour secondary to a seroma. There is no erythema, dehiscence, or nipple change. The right axilla is benign per the left breast is unremarkable.   LAB RESULTS:  CMP     Component Value Date/Time   NA 141 04/11/2015 1019   NA 140 11/01/2014 1023   K 3.8 04/11/2015 1019   K 4.2 11/01/2014 1023   CL 104 11/01/2014 1023   CO2 26 04/11/2015 1019   CO2 26 11/01/2014 1023   GLUCOSE 80 04/11/2015 1019   GLUCOSE 93 11/01/2014 1023   BUN 9.7 04/11/2015 1019   BUN 10 11/01/2014 1023   CREATININE 0.7 04/11/2015 1019   CREATININE 0.85 11/01/2014 1023   CALCIUM 9.9 04/11/2015 1019   CALCIUM 9.9 11/01/2014 1023   PROT 6.4 04/11/2015 1019   ALBUMIN 4.0 04/11/2015 1019   AST 16 04/11/2015 1019   ALT 17 04/11/2015 1019   ALKPHOS 78 04/11/2015 1019   BILITOT 0.43 04/11/2015 1019   GFRNONAA >60 11/01/2014 1023   GFRAA >60 11/01/2014 1023    INo results found for: SPEP, UPEP  Lab Results  Component Value Date   WBC 4.5 04/11/2015   NEUTROABS 3.0 04/11/2015   HGB 12.9 04/11/2015   HCT 39.6 04/11/2015   MCV 102.6* 04/11/2015   PLT 226 04/11/2015      Chemistry      Component Value Date/Time   NA 141 04/11/2015 1019   NA 140 11/01/2014 1023   K 3.8 04/11/2015 1019   K 4.2 11/01/2014 1023   CL 104 11/01/2014 1023   CO2 26 04/11/2015 1019   CO2 26 11/01/2014 1023   BUN 9.7 04/11/2015 1019   BUN 10 11/01/2014 1023   CREATININE 0.7 04/11/2015 1019   CREATININE 0.85 11/01/2014 1023      Component Value Date/Time   CALCIUM 9.9 04/11/2015 1019   CALCIUM 9.9 11/01/2014 1023   ALKPHOS 78 04/11/2015 1019   AST 16 04/11/2015 1019    ALT 17 04/11/2015 1019   BILITOT 0.43 04/11/2015 1019       No results found for: LABCA2  No components found for: LABCA125  No results for input(s): INR in the last 168 hours.  Urinalysis    Component Value Date/Time   LABSPEC 1.020 12/21/2014 1405   PHURINE 5.0 12/21/2014 1405   GLUCOSEU Negative 12/21/2014 1405   HGBUR Negative 12/21/2014 1405   BILIRUBINUR Negative 12/21/2014 1405   KETONESUR Negative 12/21/2014 1405   PROTEINUR <  30 12/21/2014 1405   UROBILINOGEN 0.2 12/21/2014 1405   NITRITE Negative 12/21/2014 1405   LEUKOCYTESUR Small 12/21/2014 1405    STUDIES: Nm Sentinel Node Inj-no Rpt (breast)  03/28/2015  CLINICAL DATA: right breast cancer Sulfur colloid was injected intradermally by the nuclear medicine technologist for breast cancer sentinel node localization.   Mm Breast Surgical Specimen  03/28/2015  CLINICAL DATA:  Right lumpectomy for invasive ductal carcinoma. EXAM: SPECIMEN RADIOGRAPH OF THE RIGHT BREAST COMPARISON:  Previous exam(s). FINDINGS: Status post excision of the right breast. The radioactive seed and biopsy marker clip are present, completely intact, and were marked for pathology. There is also a spiculated mass within the specimen, containing seed and clip. IMPRESSION: Specimen radiograph of the right breast. Electronically Signed   By: Claudie Revering M.D.   On: 03/28/2015 10:27    ASSESSMENT: 69 y.o. [place] woman status post right breast biopsy 10/16/2014 for a clinical T3 N0, stage IIA invasive ductal carcinoma, grade 2 or 3, estrogen and progesterone receptor positive, with an MIB-1 of 20%, and HER-2 amplified, with a signals ratio of 3.60  (1) chemotherapy and anti-HER-2 immunotherapy started 11/05/2014, consisting of carboplatin, docetaxel, trastuzumab and pertuzumab given every 21 days 6, with Neulasta support-- completed 02/28/2015  (2) trastuzumab will be continued to complete 1 year (through may 2017)  (a) echocardiogram  01/24/2015 shows an ejection fraction of 55%  (3) right lumpectomy and sentinel lymph node sampling 03/28/2015 showed a pT1c pN0 residual invasive ductal carcinoma, grade 2, with negative margins.  (4) adjuvant radiation to follow surgery   (5) anti-estrogens to follow completion of local treatments.  PLAN: Jeremiah did well with her surgery and had a very good response. We generally do not expect complete pathologic response his in estrogen receptor positive patients. They optimize systemic therapy by taking anti-estrogens and that will happen in this case when she completes her radiation approximately 2 months from now.  Accordingly I am going to see her again in January. She will need an echocardiogram before the end of this month and that has been ordered.  I have encouraged her to continue an exercise program which will help her avoid or minimize the fatigue she is likely to experience from radiation. I also suggested she start vitamin D now in preparation for optimizing osteoporosis prophylaxis since we will likely start anastrozole in January.  Otherwise we are continuing trastuzumab every 3 weeks beginning next week. She knows to call for any problems that may develop before then.  Chauncey Cruel, MD   04/26/2015 1:02 PM

## 2015-04-29 DIAGNOSIS — Z510 Encounter for antineoplastic radiation therapy: Secondary | ICD-10-CM | POA: Diagnosis not present

## 2015-04-29 DIAGNOSIS — C50411 Malignant neoplasm of upper-outer quadrant of right female breast: Secondary | ICD-10-CM | POA: Diagnosis not present

## 2015-04-30 DIAGNOSIS — Z510 Encounter for antineoplastic radiation therapy: Secondary | ICD-10-CM | POA: Diagnosis not present

## 2015-04-30 DIAGNOSIS — C50411 Malignant neoplasm of upper-outer quadrant of right female breast: Secondary | ICD-10-CM | POA: Diagnosis not present

## 2015-05-01 DIAGNOSIS — Z510 Encounter for antineoplastic radiation therapy: Secondary | ICD-10-CM | POA: Diagnosis not present

## 2015-05-01 DIAGNOSIS — C50411 Malignant neoplasm of upper-outer quadrant of right female breast: Secondary | ICD-10-CM | POA: Diagnosis not present

## 2015-05-02 ENCOUNTER — Ambulatory Visit (HOSPITAL_BASED_OUTPATIENT_CLINIC_OR_DEPARTMENT_OTHER): Payer: Medicare Other

## 2015-05-02 ENCOUNTER — Other Ambulatory Visit (HOSPITAL_BASED_OUTPATIENT_CLINIC_OR_DEPARTMENT_OTHER): Payer: Medicare Other

## 2015-05-02 ENCOUNTER — Ambulatory Visit: Payer: Self-pay | Admitting: Oncology

## 2015-05-02 VITALS — BP 103/86 | HR 56 | Temp 97.9°F | Resp 19

## 2015-05-02 DIAGNOSIS — C50411 Malignant neoplasm of upper-outer quadrant of right female breast: Secondary | ICD-10-CM

## 2015-05-02 DIAGNOSIS — Z5112 Encounter for antineoplastic immunotherapy: Secondary | ICD-10-CM | POA: Diagnosis not present

## 2015-05-02 DIAGNOSIS — Z510 Encounter for antineoplastic radiation therapy: Secondary | ICD-10-CM | POA: Diagnosis not present

## 2015-05-02 LAB — CBC WITH DIFFERENTIAL/PLATELET
BASO%: 0.6 % (ref 0.0–2.0)
Basophils Absolute: 0 10*3/uL (ref 0.0–0.1)
EOS ABS: 0.1 10*3/uL (ref 0.0–0.5)
EOS%: 2.4 % (ref 0.0–7.0)
HCT: 42.4 % (ref 34.8–46.6)
HEMOGLOBIN: 14.1 g/dL (ref 11.6–15.9)
LYMPH%: 24.2 % (ref 14.0–49.7)
MCH: 32.7 pg (ref 25.1–34.0)
MCHC: 33.2 g/dL (ref 31.5–36.0)
MCV: 98.6 fL (ref 79.5–101.0)
MONO#: 0.3 10*3/uL (ref 0.1–0.9)
MONO%: 5.7 % (ref 0.0–14.0)
NEUT%: 67.1 % (ref 38.4–76.8)
NEUTROS ABS: 3.3 10*3/uL (ref 1.5–6.5)
Platelets: 225 10*3/uL (ref 145–400)
RBC: 4.3 10*6/uL (ref 3.70–5.45)
RDW: 11.9 % (ref 11.2–14.5)
WBC: 4.9 10*3/uL (ref 3.9–10.3)
lymph#: 1.2 10*3/uL (ref 0.9–3.3)

## 2015-05-02 LAB — COMPREHENSIVE METABOLIC PANEL (CC13)
ALBUMIN: 4 g/dL (ref 3.5–5.0)
ALT: 15 U/L (ref 0–55)
AST: 17 U/L (ref 5–34)
Alkaline Phosphatase: 79 U/L (ref 40–150)
Anion Gap: 8 mEq/L (ref 3–11)
BUN: 14.6 mg/dL (ref 7.0–26.0)
CO2: 27 meq/L (ref 22–29)
Calcium: 9.8 mg/dL (ref 8.4–10.4)
Chloride: 106 mEq/L (ref 98–109)
Creatinine: 0.8 mg/dL (ref 0.6–1.1)
EGFR: 77 mL/min/{1.73_m2} — ABNORMAL LOW (ref >90)
GLUCOSE: 77 mg/dL (ref 70–140)
Potassium: 4.1 mEq/L (ref 3.5–5.1)
SODIUM: 141 meq/L (ref 136–145)
TOTAL PROTEIN: 6.5 g/dL (ref 6.4–8.3)
Total Bilirubin: 0.41 mg/dL (ref 0.20–1.20)

## 2015-05-02 MED ORDER — SODIUM CHLORIDE 0.9 % IV SOLN
6.0000 mg/kg | Freq: Once | INTRAVENOUS | Status: AC
  Administered 2015-05-02: 294 mg via INTRAVENOUS
  Filled 2015-05-02: qty 14

## 2015-05-02 MED ORDER — HEPARIN SOD (PORK) LOCK FLUSH 100 UNIT/ML IV SOLN
500.0000 [IU] | Freq: Once | INTRAVENOUS | Status: AC | PRN
  Administered 2015-05-02: 500 [IU]
  Filled 2015-05-02: qty 5

## 2015-05-02 MED ORDER — DIPHENHYDRAMINE HCL 25 MG PO CAPS
25.0000 mg | ORAL_CAPSULE | Freq: Once | ORAL | Status: AC
  Administered 2015-05-02: 25 mg via ORAL

## 2015-05-02 MED ORDER — SODIUM CHLORIDE 0.9 % IJ SOLN
10.0000 mL | INTRAMUSCULAR | Status: DC | PRN
  Administered 2015-05-02: 10 mL
  Filled 2015-05-02: qty 10

## 2015-05-02 MED ORDER — ACETAMINOPHEN 325 MG PO TABS
ORAL_TABLET | ORAL | Status: AC
  Filled 2015-05-02: qty 2

## 2015-05-02 MED ORDER — SODIUM CHLORIDE 0.9 % IV SOLN
Freq: Once | INTRAVENOUS | Status: AC
  Administered 2015-05-02: 10:00:00 via INTRAVENOUS

## 2015-05-02 MED ORDER — DIPHENHYDRAMINE HCL 25 MG PO CAPS
ORAL_CAPSULE | ORAL | Status: AC
  Filled 2015-05-02: qty 1

## 2015-05-02 MED ORDER — ACETAMINOPHEN 325 MG PO TABS
650.0000 mg | ORAL_TABLET | Freq: Once | ORAL | Status: AC
  Administered 2015-05-02: 650 mg via ORAL

## 2015-05-02 NOTE — Patient Instructions (Signed)
[facility] Discharge Instructions for Patients Receiving Chemotherapy  Today you received the following chemotherapy agents: Herceptin   To help prevent nausea and vomiting after your treatment, we encourage you to take your nausea medication as directed.    If you develop nausea and vomiting that is not controlled by your nausea medication, call the clinic.   BELOW ARE SYMPTOMS THAT SHOULD BE REPORTED IMMEDIATELY:  *FEVER GREATER THAN 100.5 F  *CHILLS WITH OR WITHOUT FEVER  NAUSEA AND VOMITING THAT IS NOT CONTROLLED WITH YOUR NAUSEA MEDICATION  *UNUSUAL SHORTNESS OF BREATH  *UNUSUAL BRUISING OR BLEEDING  TENDERNESS IN MOUTH AND THROAT WITH OR WITHOUT PRESENCE OF ULCERS  *URINARY PROBLEMS  *BOWEL PROBLEMS  UNUSUAL RASH Items with * indicate a potential emergency and should be followed up as soon as possible.  Feel free to call the clinic you have any questions or concerns. The clinic phone number is [phone].  Please show the CHEMO ALERT CARD at check-in to the Emergency Department and triage nurse.   

## 2015-05-03 ENCOUNTER — Ambulatory Visit: Payer: Self-pay | Admitting: Oncology

## 2015-05-03 DIAGNOSIS — C50411 Malignant neoplasm of upper-outer quadrant of right female breast: Secondary | ICD-10-CM | POA: Diagnosis not present

## 2015-05-03 DIAGNOSIS — Z510 Encounter for antineoplastic radiation therapy: Secondary | ICD-10-CM | POA: Diagnosis not present

## 2015-05-05 ENCOUNTER — Ambulatory Visit
Admission: RE | Admit: 2015-05-05 | Discharge: 2015-05-05 | Disposition: A | Discharge status: Home / Self Care | Payer: Medicare Other | Source: Ambulatory Visit | Attending: Radiation Oncology | Admitting: Radiation Oncology

## 2015-05-05 DIAGNOSIS — C50411 Malignant neoplasm of upper-outer quadrant of right female breast: Secondary | ICD-10-CM | POA: Diagnosis not present

## 2015-05-05 DIAGNOSIS — Z510 Encounter for antineoplastic radiation therapy: Secondary | ICD-10-CM | POA: Diagnosis not present

## 2015-05-06 ENCOUNTER — Encounter: Payer: Self-pay | Admitting: Radiation Oncology

## 2015-05-06 ENCOUNTER — Ambulatory Visit
Admission: RE | Admit: 2015-05-06 | Discharge: 2015-05-06 | Disposition: A | Discharge status: Home / Self Care | Payer: Medicare Other | Source: Ambulatory Visit | Attending: Radiation Oncology | Admitting: Radiation Oncology

## 2015-05-06 VITALS — BP 133/63 | HR 57 | Temp 97.8°F | Ht 66.0 in | Wt 111.8 lb

## 2015-05-06 DIAGNOSIS — Z510 Encounter for antineoplastic radiation therapy: Secondary | ICD-10-CM | POA: Diagnosis not present

## 2015-05-06 DIAGNOSIS — C50411 Malignant neoplasm of upper-outer quadrant of right female breast: Secondary | ICD-10-CM | POA: Diagnosis not present

## 2015-05-06 MED ORDER — ALRA NON-METALLIC DEODORANT (RAD-ONC)
1.0000 "application " | Freq: Once | TOPICAL | Status: AC
  Administered 2015-05-06: 1 via TOPICAL

## 2015-05-06 MED ORDER — RADIAPLEXRX EX GEL
Freq: Once | CUTANEOUS | Status: AC
  Administered 2015-05-06: 17:00:00 via TOPICAL

## 2015-05-06 NOTE — Progress Notes (Signed)
Diana Dunn is here for her 4th fraction of radiation to her Right Breast. She is eating well, and her taste buds have started to come back after her Chemotherapy completed. She reports her energy level has improved, she is walking frequently, and does not get as tired. Her skin on her Right Breast is normal appearing and the incision to her nipple is healed.   BP 133/63 mmHg  Pulse 57  Temp(Src) 97.8 F (36.6 C)  Ht 5\' 6"  (1.676 m)  Wt 111 lb 12.8 oz (50.712 kg)  BMI 18.05 kg/m2

## 2015-05-06 NOTE — Progress Notes (Signed)
Pt here for patient teaching.  Pt given Radiation and You booklet, skin care instructions, Alra deodorant and Radiaplex gel. Pt reports they have not watched the Radiation Therapy Education video and were given the link.  Reviewed areas of pertinence such as fatigue, skin changes, breast tenderness, breast swelling, cough, shortness of breath, earaches and taste changes . Pt able to give teach back of to pat skin, use unscented/gentle soap and drink plenty of water,apply Radiaplex bid, avoid applying anything to skin within 4 hours of treatment, avoid wearing an under wire bra and to use an electric razor if they must shave. Pt verbalizes understanding of information given and will contact nursing with any questions or concerns.

## 2015-05-06 NOTE — Progress Notes (Signed)
   Weekly Management Note:  Outpatient    ICD-9-CM ICD-10-CM   1. Breast cancer of upper-outer quadrant of right female breast (HCC) 174.4 [id number] non-metallic deodorant (ALRA) 1 application     hyaluronate sodium (RADIAPLEXRX) gel    Current Dose:  7.2 Gy  Projected Dose: 60 Gy   Narrative:  The patient presents for routine under treatment assessment.  CBCT/MVCT images/Port film x-rays were reviewed.  The chart was checked. No new issues  Physical Findings:  height is 5\' 6"  (1.676 m) and weight is 111 lb 12.8 oz (50.712 kg). Her temperature is 97.8 F (36.6 C). Her blood pressure is 133/63 and her pulse is 57.   Wt Readings from Last 3 Encounters:  05/06/15 111 lb 12.8 oz (50.712 kg)  04/26/15 111 lb 6.4 oz (50.531 kg)  04/12/15 114 lb 3.2 oz (51.801 kg)   Skin intact, no acute changes  Impression:  The patient is tolerating radiotherapy.  Plan:  Continue radiotherapy as planned.    ________________________________   Eppie Gibson, M.D.

## 2015-05-07 ENCOUNTER — Ambulatory Visit
Admission: RE | Admit: 2015-05-07 | Discharge: 2015-05-07 | Disposition: A | Discharge status: Home / Self Care | Payer: Medicare Other | Source: Ambulatory Visit | Attending: Radiation Oncology | Admitting: Radiation Oncology

## 2015-05-07 DIAGNOSIS — C50411 Malignant neoplasm of upper-outer quadrant of right female breast: Secondary | ICD-10-CM | POA: Diagnosis not present

## 2015-05-07 DIAGNOSIS — Z510 Encounter for antineoplastic radiation therapy: Secondary | ICD-10-CM | POA: Diagnosis not present

## 2015-05-08 ENCOUNTER — Ambulatory Visit
Admission: RE | Admit: 2015-05-08 | Discharge: 2015-05-08 | Disposition: A | Discharge status: Home / Self Care | Payer: Medicare Other | Source: Ambulatory Visit | Attending: Radiation Oncology | Admitting: Radiation Oncology

## 2015-05-08 DIAGNOSIS — C50411 Malignant neoplasm of upper-outer quadrant of right female breast: Secondary | ICD-10-CM | POA: Diagnosis not present

## 2015-05-08 DIAGNOSIS — Z510 Encounter for antineoplastic radiation therapy: Secondary | ICD-10-CM | POA: Diagnosis not present

## 2015-05-13 ENCOUNTER — Ambulatory Visit
Admission: RE | Admit: 2015-05-13 | Discharge: 2015-05-13 | Disposition: A | Discharge status: Home / Self Care | Payer: Medicare Other | Source: Ambulatory Visit | Attending: Radiation Oncology | Admitting: Radiation Oncology

## 2015-05-13 ENCOUNTER — Encounter: Payer: Self-pay | Admitting: Radiation Oncology

## 2015-05-13 VITALS — BP 135/53 | HR 56 | Temp 98.4°F | Ht 66.0 in | Wt 112.1 lb

## 2015-05-13 DIAGNOSIS — Z510 Encounter for antineoplastic radiation therapy: Secondary | ICD-10-CM | POA: Diagnosis not present

## 2015-05-13 DIAGNOSIS — C50411 Malignant neoplasm of upper-outer quadrant of right female breast: Secondary | ICD-10-CM

## 2015-05-13 NOTE — Progress Notes (Signed)
   Weekly Management Note:  Outpatient    ICD-9-CM ICD-10-CM   1. Breast cancer of upper-outer quadrant of right female breast (HCC) 174.4 C50.411     Current Dose:   14 Gy  Projected Dose: 60 Gy   Narrative:  The patient presents for routine under treatment assessment.  CBCT/MVCT images/Port film x-rays were reviewed.  The chart was checked. Diana Dunn has completed 7 fractions to her right breast.  She denies having any pain.  She reports slight fatigue from having company this weekend.   She is using radiaplex gel.  BP 135/53 mmHg  Pulse 56  Temp(Src) 98.4 F (36.9 C) (Oral)  Ht 5\' 6"  (1.676 m)  Wt 112 lb 1.6 oz (50.848 kg)  BMI 18.10 kg/m2   Physical Findings:  height is 5\' 6"  (1.676 m) and weight is 112 lb 1.6 oz (50.848 kg). Her oral temperature is 98.4 F (36.9 C). Her blood pressure is 135/53 and her pulse is 56.    Wt Readings from Last 3 Encounters:  05/13/15 112 lb 1.6 oz (50.848 kg)  05/06/15 111 lb 12.8 oz (50.712 kg)  04/26/15 111 lb 6.4 oz (50.531 kg)   Skin intact, mildly hyperpigmented over right breast  Impression:  The patient is tolerating radiotherapy.  Plan:  Continue radiotherapy as planned.    ________________________________   Eppie Gibson, M.D.

## 2015-05-13 NOTE — Progress Notes (Addendum)
Diana Dunn has completed 7 fractions to her right breast.  She denies having any pain.  She reports slight fatigue from having company this weekend.  The skin on her right breast is intact.  She is using radiaplex gel.  BP 135/53 mmHg  Pulse 56  Temp(Src) 98.4 F (36.9 C) (Oral)  Ht 5\' 6"  (1.676 m)  Wt 112 lb 1.6 oz (50.848 kg)  BMI 18.10 kg/m2

## 2015-05-14 ENCOUNTER — Ambulatory Visit
Admission: RE | Admit: 2015-05-14 | Discharge: 2015-05-14 | Disposition: A | Discharge status: Home / Self Care | Payer: Medicare Other | Source: Ambulatory Visit | Attending: Radiation Oncology | Admitting: Radiation Oncology

## 2015-05-14 DIAGNOSIS — C50411 Malignant neoplasm of upper-outer quadrant of right female breast: Secondary | ICD-10-CM | POA: Diagnosis not present

## 2015-05-14 DIAGNOSIS — Z510 Encounter for antineoplastic radiation therapy: Secondary | ICD-10-CM | POA: Diagnosis not present

## 2015-05-15 ENCOUNTER — Other Ambulatory Visit: Payer: Self-pay

## 2015-05-15 ENCOUNTER — Ambulatory Visit (HOSPITAL_COMMUNITY): Payer: Medicare Other | Attending: Oncology

## 2015-05-15 ENCOUNTER — Ambulatory Visit
Admission: RE | Admit: 2015-05-15 | Discharge: 2015-05-15 | Disposition: A | Discharge status: Home / Self Care | Payer: Medicare Other | Source: Ambulatory Visit | Attending: Radiation Oncology | Admitting: Radiation Oncology

## 2015-05-15 DIAGNOSIS — I517 Cardiomegaly: Secondary | ICD-10-CM | POA: Insufficient documentation

## 2015-05-15 DIAGNOSIS — Z510 Encounter for antineoplastic radiation therapy: Secondary | ICD-10-CM | POA: Diagnosis not present

## 2015-05-15 DIAGNOSIS — C50411 Malignant neoplasm of upper-outer quadrant of right female breast: Secondary | ICD-10-CM | POA: Diagnosis not present

## 2015-05-15 DIAGNOSIS — Z09 Encounter for follow-up examination after completed treatment for conditions other than malignant neoplasm: Secondary | ICD-10-CM | POA: Diagnosis present

## 2015-05-16 ENCOUNTER — Ambulatory Visit
Admission: RE | Admit: 2015-05-16 | Discharge: 2015-05-16 | Disposition: A | Discharge status: Home / Self Care | Payer: Medicare Other | Source: Ambulatory Visit | Attending: Radiation Oncology | Admitting: Radiation Oncology

## 2015-05-16 DIAGNOSIS — C50411 Malignant neoplasm of upper-outer quadrant of right female breast: Secondary | ICD-10-CM | POA: Diagnosis not present

## 2015-05-16 DIAGNOSIS — Z510 Encounter for antineoplastic radiation therapy: Secondary | ICD-10-CM | POA: Diagnosis not present

## 2015-05-17 ENCOUNTER — Ambulatory Visit
Admission: RE | Admit: 2015-05-17 | Discharge: 2015-05-17 | Disposition: A | Discharge status: Home / Self Care | Payer: Medicare Other | Source: Ambulatory Visit | Attending: Radiation Oncology | Admitting: Radiation Oncology

## 2015-05-17 DIAGNOSIS — C50411 Malignant neoplasm of upper-outer quadrant of right female breast: Secondary | ICD-10-CM | POA: Diagnosis not present

## 2015-05-17 DIAGNOSIS — Z510 Encounter for antineoplastic radiation therapy: Secondary | ICD-10-CM | POA: Diagnosis not present

## 2015-05-20 ENCOUNTER — Ambulatory Visit
Admission: RE | Admit: 2015-05-20 | Discharge: 2015-05-20 | Disposition: A | Discharge status: Home / Self Care | Payer: Medicare Other | Source: Ambulatory Visit | Attending: Radiation Oncology | Admitting: Radiation Oncology

## 2015-05-20 ENCOUNTER — Encounter: Payer: Self-pay | Admitting: Radiation Oncology

## 2015-05-20 VITALS — BP 128/69 | HR 57 | Temp 98.0°F | Ht 66.0 in | Wt 111.5 lb

## 2015-05-20 DIAGNOSIS — Z510 Encounter for antineoplastic radiation therapy: Secondary | ICD-10-CM | POA: Diagnosis not present

## 2015-05-20 DIAGNOSIS — C50411 Malignant neoplasm of upper-outer quadrant of right female breast: Secondary | ICD-10-CM

## 2015-05-20 NOTE — Progress Notes (Signed)
   Weekly Management Note:  Outpatient    ICD-9-CM ICD-10-CM   1. Breast cancer of upper-outer quadrant of right female breast (HCC) 174.4 C50.411     Current Dose:   24 Gy  Projected Dose: 60 Gy   Narrative:  The patient presents for routine under treatment assessment.  CBCT/MVCT images/Port film x-rays were reviewed.  The chart was checked. Doing well, slight ithcing. Some fatigue     Physical Findings:  height is 5\' 6"  (1.676 m) and weight is 111 lb 8 oz (50.576 kg). Her temperature is 98 F (36.7 C). Her blood pressure is 128/69 and her pulse is 57.    Wt Readings from Last 3 Encounters:  05/20/15 111 lb 8 oz (50.576 kg)  05/13/15 112 lb 1.6 oz (50.848 kg)  05/06/15 111 lb 12.8 oz (50.712 kg)   Skin intact, slightly dry/ hyperpigmented over right breast  Impression:  The patient is tolerating radiotherapy.  Plan:  Continue radiotherapy as planned.   hydrocortisone 1% cream prn itching  ________________________________   Eppie Gibson, M.D.

## 2015-05-20 NOTE — Progress Notes (Signed)
Diana Dunn is here for her 12th fraction of radiation to her Right Breast. She denies pain at this time, but does admit to some tenderness. Her Right supraclavicle area is red, and she reports it was itching last night. She is using the radiaplex cream as directed. She admits to some fatigue, especially towards the end of last week. She has no other complaints at this time.   BP 128/69 mmHg  Pulse 57  Temp(Src) 98 F (36.7 C)  Ht 5\' 6"  (1.676 m)  Wt 111 lb 8 oz (50.576 kg)  BMI 18.01 kg/m2   Wt Readings from Last 3 Encounters:  05/20/15 111 lb 8 oz (50.576 kg)  05/13/15 112 lb 1.6 oz (50.848 kg)  05/06/15 111 lb 12.8 oz (50.712 kg)

## 2015-05-21 ENCOUNTER — Other Ambulatory Visit: Payer: Self-pay

## 2015-05-21 ENCOUNTER — Ambulatory Visit
Admission: RE | Admit: 2015-05-21 | Discharge: 2015-05-21 | Disposition: A | Discharge status: Home / Self Care | Payer: Medicare Other | Source: Ambulatory Visit | Attending: Radiation Oncology | Admitting: Radiation Oncology

## 2015-05-21 DIAGNOSIS — C50411 Malignant neoplasm of upper-outer quadrant of right female breast: Secondary | ICD-10-CM | POA: Diagnosis not present

## 2015-05-21 DIAGNOSIS — Z510 Encounter for antineoplastic radiation therapy: Secondary | ICD-10-CM | POA: Diagnosis not present

## 2015-05-21 MED ORDER — LIDOCAINE-PRILOCAINE 2.5-2.5 % EX CREA: 1.0000 "application " | TOPICAL_CREAM | CUTANEOUS | Status: DC | PRN

## 2015-05-22 ENCOUNTER — Ambulatory Visit
Admission: RE | Admit: 2015-05-22 | Discharge: 2015-05-22 | Disposition: A | Discharge status: Home / Self Care | Payer: Medicare Other | Source: Ambulatory Visit | Attending: Radiation Oncology | Admitting: Radiation Oncology

## 2015-05-22 ENCOUNTER — Telehealth: Payer: Self-pay | Admitting: *Deleted

## 2015-05-22 ENCOUNTER — Other Ambulatory Visit: Payer: Self-pay | Admitting: *Deleted

## 2015-05-22 DIAGNOSIS — Z510 Encounter for antineoplastic radiation therapy: Secondary | ICD-10-CM | POA: Diagnosis not present

## 2015-05-22 DIAGNOSIS — Z95828 Presence of other vascular implants and grafts: Secondary | ICD-10-CM

## 2015-05-22 DIAGNOSIS — C50411 Malignant neoplasm of upper-outer quadrant of right female breast: Secondary | ICD-10-CM

## 2015-05-22 NOTE — Telephone Encounter (Signed)
Per chart review last chest xray obtained for evaluation of cough noted tip of port in new position vs post placement reading.  Due to pt being scheduled for IV therapy- need to verify tip of port placement with dedicated study.  Discussed above with pt who will come in prior to appointment 12/08 to obtain a CXR.

## 2015-05-23 ENCOUNTER — Ambulatory Visit
Admission: RE | Admit: 2015-05-23 | Discharge: 2015-05-23 | Disposition: A | Discharge status: Home / Self Care | Payer: Medicare Other | Source: Ambulatory Visit | Attending: Radiation Oncology | Admitting: Radiation Oncology

## 2015-05-23 ENCOUNTER — Ambulatory Visit (HOSPITAL_BASED_OUTPATIENT_CLINIC_OR_DEPARTMENT_OTHER): Payer: Medicare Other

## 2015-05-23 ENCOUNTER — Other Ambulatory Visit (HOSPITAL_BASED_OUTPATIENT_CLINIC_OR_DEPARTMENT_OTHER): Payer: Medicare Other

## 2015-05-23 ENCOUNTER — Other Ambulatory Visit: Payer: Self-pay | Admitting: Oncology

## 2015-05-23 ENCOUNTER — Ambulatory Visit (HOSPITAL_COMMUNITY)
Admission: RE | Admit: 2015-05-23 | Discharge: 2015-05-23 | Disposition: A | Discharge status: Home / Self Care | Payer: Medicare Other | Source: Ambulatory Visit | Attending: Oncology | Admitting: Oncology

## 2015-05-23 VITALS — BP 126/67 | HR 58 | Temp 97.4°F | Resp 16

## 2015-05-23 DIAGNOSIS — C50411 Malignant neoplasm of upper-outer quadrant of right female breast: Secondary | ICD-10-CM

## 2015-05-23 DIAGNOSIS — Z5112 Encounter for antineoplastic immunotherapy: Secondary | ICD-10-CM

## 2015-05-23 DIAGNOSIS — C50919 Malignant neoplasm of unspecified site of unspecified female breast: Secondary | ICD-10-CM | POA: Diagnosis not present

## 2015-05-23 DIAGNOSIS — Z95828 Presence of other vascular implants and grafts: Secondary | ICD-10-CM

## 2015-05-23 DIAGNOSIS — Z510 Encounter for antineoplastic radiation therapy: Secondary | ICD-10-CM | POA: Diagnosis not present

## 2015-05-23 LAB — CBC WITH DIFFERENTIAL/PLATELET
BASO%: 0.2 % (ref 0.0–2.0)
BASOS ABS: 0 10*3/uL (ref 0.0–0.1)
EOS%: 1.7 % (ref 0.0–7.0)
Eosinophils Absolute: 0.1 10*3/uL (ref 0.0–0.5)
HEMATOCRIT: 42.3 % (ref 34.8–46.6)
HGB: 14.4 g/dL (ref 11.6–15.9)
LYMPH#: 0.9 10*3/uL (ref 0.9–3.3)
LYMPH%: 18.1 % (ref 14.0–49.7)
MCH: 32.8 pg (ref 25.1–34.0)
MCHC: 34 g/dL (ref 31.5–36.0)
MCV: 96.4 fL (ref 79.5–101.0)
MONO#: 0.3 10*3/uL (ref 0.1–0.9)
MONO%: 6.6 % (ref 0.0–14.0)
NEUT#: 3.4 10*3/uL (ref 1.5–6.5)
NEUT%: 73.4 % (ref 38.4–76.8)
Platelets: 201 10*3/uL (ref 145–400)
RBC: 4.39 10*6/uL (ref 3.70–5.45)
RDW: 11.8 % (ref 11.2–14.5)
WBC: 4.7 10*3/uL (ref 3.9–10.3)

## 2015-05-23 LAB — COMPREHENSIVE METABOLIC PANEL
ALT: 12 U/L (ref 0–55)
AST: 16 U/L (ref 5–34)
Albumin: 4.2 g/dL (ref 3.5–5.0)
Alkaline Phosphatase: 86 U/L (ref 40–150)
Anion Gap: 13 mEq/L — ABNORMAL HIGH (ref 3–11)
BUN: 13.9 mg/dL (ref 7.0–26.0)
CALCIUM: 10 mg/dL (ref 8.4–10.4)
CHLORIDE: 106 meq/L (ref 98–109)
CO2: 24 mEq/L (ref 22–29)
Creatinine: 0.8 mg/dL (ref 0.6–1.1)
EGFR: 73 mL/min/{1.73_m2} — AB (ref >90)
Glucose: 90 mg/dl (ref 70–140)
POTASSIUM: 4.4 meq/L (ref 3.5–5.1)
Sodium: 142 mEq/L (ref 136–145)
Total Bilirubin: 0.58 mg/dL (ref 0.20–1.20)
Total Protein: 6.8 g/dL (ref 6.4–8.3)

## 2015-05-23 MED ORDER — SODIUM CHLORIDE 0.9 % IV SOLN
Freq: Once | INTRAVENOUS | Status: AC
  Administered 2015-05-23: 11:00:00 via INTRAVENOUS

## 2015-05-23 MED ORDER — ACETAMINOPHEN 325 MG PO TABS
650.0000 mg | ORAL_TABLET | Freq: Once | ORAL | Status: AC
Start: 2015-05-23 — End: 2015-05-23
  Administered 2015-05-23: 650 mg via ORAL

## 2015-05-23 MED ORDER — ACETAMINOPHEN 325 MG PO TABS
ORAL_TABLET | ORAL | Status: AC
  Filled 2015-05-23: qty 2

## 2015-05-23 MED ORDER — TRASTUZUMAB CHEMO INJECTION 440 MG
6.0000 mg/kg | Freq: Once | INTRAVENOUS | Status: AC
  Administered 2015-05-23: 294 mg via INTRAVENOUS
  Filled 2015-05-23: qty 14

## 2015-05-23 NOTE — Patient Instructions (Signed)
[facility] Discharge Instructions for Patients Receiving Chemotherapy  Today you received the following chemotherapy agents: Herceptin   To help prevent nausea and vomiting after your treatment, we encourage you to take your nausea medication as directed.    If you develop nausea and vomiting that is not controlled by your nausea medication, call the clinic.   BELOW ARE SYMPTOMS THAT SHOULD BE REPORTED IMMEDIATELY:  *FEVER GREATER THAN 100.5 F  *CHILLS WITH OR WITHOUT FEVER  NAUSEA AND VOMITING THAT IS NOT CONTROLLED WITH YOUR NAUSEA MEDICATION  *UNUSUAL SHORTNESS OF BREATH  *UNUSUAL BRUISING OR BLEEDING  TENDERNESS IN MOUTH AND THROAT WITH OR WITHOUT PRESENCE OF ULCERS  *URINARY PROBLEMS  *BOWEL PROBLEMS  UNUSUAL RASH Items with * indicate a potential emergency and should be followed up as soon as possible.  Feel free to call the clinic you have any questions or concerns. The clinic phone number is [phone].  Please show the CHEMO ALERT CARD at check-in to the Emergency Department and triage nurse.   

## 2015-05-23 NOTE — Progress Notes (Signed)
Patient refused benadryl.   She has had no problem omitting this in previous herceptin. CXR shows PAC tip in innominate vein.  Will use peripheral IV.  Dr. Virgie Dad RN - Zigmund Daniel notified.

## 2015-05-24 ENCOUNTER — Other Ambulatory Visit: Payer: Self-pay | Admitting: Oncology

## 2015-05-24 ENCOUNTER — Ambulatory Visit
Admission: RE | Admit: 2015-05-24 | Discharge: 2015-05-24 | Disposition: A | Discharge status: Home / Self Care | Payer: Medicare Other | Source: Ambulatory Visit | Attending: Radiation Oncology | Admitting: Radiation Oncology

## 2015-05-24 DIAGNOSIS — Z510 Encounter for antineoplastic radiation therapy: Secondary | ICD-10-CM | POA: Diagnosis not present

## 2015-05-24 DIAGNOSIS — C50411 Malignant neoplasm of upper-outer quadrant of right female breast: Secondary | ICD-10-CM | POA: Diagnosis not present

## 2015-05-27 ENCOUNTER — Ambulatory Visit
Admission: RE | Admit: 2015-05-27 | Discharge: 2015-05-27 | Disposition: A | Discharge status: Home / Self Care | Payer: Medicare Other | Source: Ambulatory Visit | Attending: Radiation Oncology | Admitting: Radiation Oncology

## 2015-05-27 ENCOUNTER — Encounter: Payer: Self-pay | Admitting: Radiation Oncology

## 2015-05-27 VITALS — BP 105/87 | HR 57 | Temp 98.2°F | Resp 12 | Wt 111.2 lb

## 2015-05-27 DIAGNOSIS — C50411 Malignant neoplasm of upper-outer quadrant of right female breast: Secondary | ICD-10-CM

## 2015-05-27 DIAGNOSIS — Z510 Encounter for antineoplastic radiation therapy: Secondary | ICD-10-CM | POA: Diagnosis not present

## 2015-05-27 MED ORDER — SONAFINE EX EMUL
1.0000 "application " | Freq: Once | CUTANEOUS | Status: AC
  Administered 2015-05-27: 1 via TOPICAL
  Filled 2015-05-27: qty 45

## 2015-05-27 MED ORDER — SONAFINE EX EMUL: 1.0000 "application " | Freq: Two times a day (BID) | CUTANEOUS | Status: DC

## 2015-05-27 NOTE — Progress Notes (Signed)
PAIN: She is currently in no pain.  SKIN: Pt right breast- positive for Dryness, Pruritus and erythema, tenderness.  Pt denies edema.  Pt continues to apply Radiaplex and Hydrocortisone as directed. OTHER: Pt complains of fatigue. BP 105/87 mmHg  Pulse 57  Temp(Src) 98.2 F (36.8 C) (Oral)  Resp 12  Wt 111 lb 3.2 oz (50.44 kg)  SpO2 99% Wt Readings from Last 3 Encounters:  05/27/15 111 lb 3.2 oz (50.44 kg)  05/20/15 111 lb 8 oz (50.576 kg)  05/13/15 112 lb 1.6 oz (50.848 kg)

## 2015-05-27 NOTE — Progress Notes (Signed)
   Weekly Management Note:  Outpatient    ICD-9-CM ICD-10-CM   1. Breast cancer of upper-outer quadrant of right female breast (HCC) 174.4 C50.411 SONAFINE emulsion 1 application     DISCONTINUED: SONAFINE emulsion 1 application    Current Dose:   34 Gy  Projected Dose: 60 Gy   Narrative:  The patient presents for routine under treatment assessment.  CBCT/MVCT images/Port film x-rays were reviewed.  The chart was checked. Doing well, more burning over right breast.  hydrocortisone 1% cream made it feel worse.    Physical Findings:  weight is 111 lb 3.2 oz (50.44 kg). Her oral temperature is 98.2 F (36.8 C). Her blood pressure is 105/87 and her pulse is 57. Her respiration is 12 and oxygen saturation is 99%.    Wt Readings from Last 3 Encounters:  05/27/15 111 lb 3.2 oz (50.44 kg)  05/20/15 111 lb 8 oz (50.576 kg)  05/13/15 112 lb 1.6 oz (50.848 kg)   Skin intact,   Dry/ erythematous over right breast, with UIQ dermatitis.  Impression:  The patient is tolerating radiotherapy.  Plan:  Continue radiotherapy as planned.   Stop hydrocortisone 1% cream  Change Radiaplex to Sonafine cream.  ________________________________   Eppie Gibson, M.D.

## 2015-05-28 ENCOUNTER — Ambulatory Visit
Admission: RE | Admit: 2015-05-28 | Discharge: 2015-05-28 | Disposition: A | Discharge status: Home / Self Care | Payer: Medicare Other | Source: Ambulatory Visit | Attending: Radiation Oncology | Admitting: Radiation Oncology

## 2015-05-28 DIAGNOSIS — C50411 Malignant neoplasm of upper-outer quadrant of right female breast: Secondary | ICD-10-CM | POA: Diagnosis not present

## 2015-05-28 DIAGNOSIS — Z510 Encounter for antineoplastic radiation therapy: Secondary | ICD-10-CM | POA: Diagnosis not present

## 2015-05-29 ENCOUNTER — Ambulatory Visit
Admission: RE | Admit: 2015-05-29 | Discharge: 2015-05-29 | Disposition: A | Discharge status: Home / Self Care | Payer: Medicare Other | Source: Ambulatory Visit | Attending: Radiation Oncology | Admitting: Radiation Oncology

## 2015-05-29 ENCOUNTER — Other Ambulatory Visit: Payer: Self-pay | Admitting: *Deleted

## 2015-05-29 DIAGNOSIS — C50411 Malignant neoplasm of upper-outer quadrant of right female breast: Secondary | ICD-10-CM

## 2015-05-29 DIAGNOSIS — Z510 Encounter for antineoplastic radiation therapy: Secondary | ICD-10-CM | POA: Diagnosis not present

## 2015-05-29 DIAGNOSIS — R9389 Abnormal findings on diagnostic imaging of other specified body structures: Secondary | ICD-10-CM

## 2015-05-29 DIAGNOSIS — Z95828 Presence of other vascular implants and grafts: Secondary | ICD-10-CM

## 2015-05-30 ENCOUNTER — Telehealth: Payer: Self-pay | Admitting: *Deleted

## 2015-05-30 ENCOUNTER — Ambulatory Visit
Admission: RE | Admit: 2015-05-30 | Discharge: 2015-05-30 | Disposition: A | Discharge status: Home / Self Care | Payer: Medicare Other | Source: Ambulatory Visit | Attending: Radiation Oncology | Admitting: Radiation Oncology

## 2015-05-30 DIAGNOSIS — C50411 Malignant neoplasm of upper-outer quadrant of right female breast: Secondary | ICD-10-CM | POA: Diagnosis not present

## 2015-05-30 DIAGNOSIS — Z510 Encounter for antineoplastic radiation therapy: Secondary | ICD-10-CM | POA: Diagnosis not present

## 2015-05-30 NOTE — Telephone Encounter (Signed)
Per review of port tip migration with noted kink with surgeon requesting revision by IR this RN called and discussed plan with pt.  Pt states she is comfortable with having port removed and proceeding with IV sticks per peripheal access.  This RN informed pt to discuss the above with IR when they call her due to revision may not be complicated and may render usable port for IV access.  Diana Dunn verbalized understanding. No further needs at this time.

## 2015-05-31 ENCOUNTER — Encounter: Payer: Self-pay | Admitting: Radiation Oncology

## 2015-05-31 ENCOUNTER — Ambulatory Visit
Admission: RE | Admit: 2015-05-31 | Discharge: 2015-05-31 | Disposition: A | Discharge status: Home / Self Care | Payer: Medicare Other | Source: Ambulatory Visit | Attending: Radiation Oncology | Admitting: Radiation Oncology

## 2015-05-31 DIAGNOSIS — C50411 Malignant neoplasm of upper-outer quadrant of right female breast: Secondary | ICD-10-CM | POA: Diagnosis not present

## 2015-05-31 DIAGNOSIS — Z510 Encounter for antineoplastic radiation therapy: Secondary | ICD-10-CM | POA: Diagnosis not present

## 2015-06-03 ENCOUNTER — Ambulatory Visit
Admission: RE | Admit: 2015-06-03 | Discharge: 2015-06-03 | Disposition: A | Discharge status: Home / Self Care | Payer: Medicare Other | Source: Ambulatory Visit | Attending: Radiation Oncology | Admitting: Radiation Oncology

## 2015-06-03 ENCOUNTER — Telehealth: Payer: Self-pay

## 2015-06-03 VITALS — BP 136/97 | HR 61 | Temp 97.8°F | Ht 66.0 in | Wt 111.2 lb

## 2015-06-03 DIAGNOSIS — C50411 Malignant neoplasm of upper-outer quadrant of right female breast: Secondary | ICD-10-CM

## 2015-06-03 DIAGNOSIS — Z510 Encounter for antineoplastic radiation therapy: Secondary | ICD-10-CM | POA: Diagnosis not present

## 2015-06-03 NOTE — Progress Notes (Signed)
   Weekly Management Note:  Outpatient    ICD-9-CM ICD-10-CM   1. Breast cancer of upper-outer quadrant of right female breast (HCC) 174.4 C50.411     Current Dose:   44 Gy  Projected Dose: 60 Gy   Narrative:  The patient presents for routine under treatment assessment.  CBCT/MVCT images/Port film x-rays were reviewed.  The chart was checked. Doing well, more burning over right breast     Physical Findings:  height is 5\' 6"  (1.676 m) and weight is 111 lb 3.2 oz (50.44 kg). Her oral temperature is 97.8 F (36.6 C). Her blood pressure is 136/97 and her pulse is 61.    Wt Readings from Last 3 Encounters:  06/03/15 111 lb 3.2 oz (50.44 kg)  05/27/15 111 lb 3.2 oz (50.44 kg)  05/20/15 111 lb 8 oz (50.576 kg)   Skin intact,   Dry/ erythematous over right breast, with UIQ dermatitis persisting.  Impression:  The patient is tolerating radiotherapy.  Plan:  Continue radiotherapy as planned.   Use Radiaplex or Sonafine cream - whichever burns less when applying  ________________________________   Eppie Gibson, M.D.

## 2015-06-03 NOTE — Progress Notes (Deleted)
   Weekly Management Note:  Outpatient    ICD-9-CM ICD-10-CM   1. Breast cancer of upper-outer quadrant of right female breast (HCC) 174.4 C50.411     Current Dose:   44 Gy  Projected Dose: 60 Gy   Narrative:  The patient presents for routine under treatment assessment.  CBCT/MVCT images/Port film x-rays were reviewed.  The chart was checked. Doing well, still senses burning over right breast     Physical Findings:  vitals were not taken for this visit.   Wt Readings from Last 3 Encounters:  06/03/15 111 lb 3.2 oz (50.44 kg)  05/27/15 111 lb 3.2 oz (50.44 kg)  05/20/15 111 lb 8 oz (50.576 kg)   Skin intact,   Dry/ erythematous over right breast, with UIQ dermatitis.  Impression:  The patient is tolerating radiotherapy.  Plan:  Continue radiotherapy as planned.   Stop hydrocortisone 1% cream  Change Radiaplex to Sonafine cream.  ________________________________   Eppie Gibson, M.D.

## 2015-06-03 NOTE — Progress Notes (Signed)
Diana Dunn has completed 22 fractions to her right breast.  She reports tenderness and itching in her right breast.  She reports slight fatigue in the mornings and late in the day.  The skin on her right breast is red.  She is using sonafine but reports it burns when she first puts it on and her skin gets more red.    BP 136/97 mmHg  Pulse 61  Temp(Src) 97.8 F (36.6 C) (Oral)  Ht 5\' 6"  (1.676 m)  Wt 111 lb 3.2 oz (50.44 kg)  BMI 17.96 kg/m2

## 2015-06-03 NOTE — Telephone Encounter (Signed)
IR called to inform Dr. Jana Hakim that patient was called and was offered an appt, to have port evaluated- however she turned down the appt stating that she would be seeing Dr. [name] on 06/26/15 for evaluation of her port and possible removal.

## 2015-06-04 ENCOUNTER — Ambulatory Visit
Admission: RE | Admit: 2015-06-04 | Discharge: 2015-06-04 | Disposition: A | Discharge status: Home / Self Care | Payer: Medicare Other | Source: Ambulatory Visit | Attending: Radiation Oncology | Admitting: Radiation Oncology

## 2015-06-04 DIAGNOSIS — C50411 Malignant neoplasm of upper-outer quadrant of right female breast: Secondary | ICD-10-CM | POA: Diagnosis not present

## 2015-06-04 DIAGNOSIS — Z510 Encounter for antineoplastic radiation therapy: Secondary | ICD-10-CM | POA: Diagnosis not present

## 2015-06-05 ENCOUNTER — Ambulatory Visit
Admission: RE | Admit: 2015-06-05 | Discharge: 2015-06-05 | Disposition: A | Discharge status: Home / Self Care | Payer: Medicare Other | Source: Ambulatory Visit | Attending: Radiation Oncology | Admitting: Radiation Oncology

## 2015-06-05 DIAGNOSIS — Z510 Encounter for antineoplastic radiation therapy: Secondary | ICD-10-CM | POA: Diagnosis not present

## 2015-06-05 DIAGNOSIS — C50411 Malignant neoplasm of upper-outer quadrant of right female breast: Secondary | ICD-10-CM | POA: Diagnosis not present

## 2015-06-06 ENCOUNTER — Ambulatory Visit
Admission: RE | Admit: 2015-06-06 | Discharge: 2015-06-06 | Disposition: A | Discharge status: Home / Self Care | Payer: Medicare Other | Source: Ambulatory Visit | Attending: Radiation Oncology | Admitting: Radiation Oncology

## 2015-06-06 DIAGNOSIS — C50411 Malignant neoplasm of upper-outer quadrant of right female breast: Secondary | ICD-10-CM | POA: Diagnosis not present

## 2015-06-06 DIAGNOSIS — Z510 Encounter for antineoplastic radiation therapy: Secondary | ICD-10-CM | POA: Diagnosis not present

## 2015-06-07 ENCOUNTER — Ambulatory Visit: Payer: Medicare Other | Admitting: Radiation Oncology

## 2015-06-07 ENCOUNTER — Ambulatory Visit
Admission: RE | Admit: 2015-06-07 | Discharge: 2015-06-07 | Disposition: A | Discharge status: Home / Self Care | Payer: Medicare Other | Source: Ambulatory Visit | Attending: Radiation Oncology | Admitting: Radiation Oncology

## 2015-06-07 DIAGNOSIS — Z510 Encounter for antineoplastic radiation therapy: Secondary | ICD-10-CM | POA: Diagnosis not present

## 2015-06-07 DIAGNOSIS — C50411 Malignant neoplasm of upper-outer quadrant of right female breast: Secondary | ICD-10-CM | POA: Diagnosis not present

## 2015-06-11 ENCOUNTER — Ambulatory Visit
Admission: RE | Admit: 2015-06-11 | Discharge: 2015-06-11 | Disposition: A | Discharge status: Home / Self Care | Payer: Medicare Other | Source: Ambulatory Visit | Attending: Radiation Oncology | Admitting: Radiation Oncology

## 2015-06-11 DIAGNOSIS — Z510 Encounter for antineoplastic radiation therapy: Secondary | ICD-10-CM | POA: Diagnosis not present

## 2015-06-11 DIAGNOSIS — C50411 Malignant neoplasm of upper-outer quadrant of right female breast: Secondary | ICD-10-CM | POA: Diagnosis not present

## 2015-06-12 ENCOUNTER — Ambulatory Visit
Admission: RE | Admit: 2015-06-12 | Discharge: 2015-06-12 | Disposition: A | Discharge status: Home / Self Care | Payer: Medicare Other | Source: Ambulatory Visit | Attending: Radiation Oncology | Admitting: Radiation Oncology

## 2015-06-12 ENCOUNTER — Encounter: Payer: Self-pay | Admitting: Radiation Oncology

## 2015-06-12 VITALS — BP 143/68 | HR 61 | Temp 98.3°F | Resp 16 | Wt 110.4 lb

## 2015-06-12 DIAGNOSIS — C50411 Malignant neoplasm of upper-outer quadrant of right female breast: Secondary | ICD-10-CM

## 2015-06-12 DIAGNOSIS — Z510 Encounter for antineoplastic radiation therapy: Secondary | ICD-10-CM | POA: Diagnosis not present

## 2015-06-12 NOTE — Progress Notes (Addendum)
weekly rad txs right breast, 28/30 completed, erythema and dry desquamation noted, slight  Itching but better, using radiaplex bid, appetite good, still fatigued still has sonafine cream but that made her breast burn and get redder, Herceptin tomorrow and every 3 weeks BP 143/68 mmHg  Pulse 61  Temp(Src) 98.3 F (36.8 C) (Oral)  Resp 16  Wt 110 lb 6.4 oz (50.077 kg)  Wt Readings from Last 3 Encounters:  06/12/15 110 lb 6.4 oz (50.077 kg)  06/03/15 111 lb 3.2 oz (50.44 kg)  05/27/15 111 lb 3.2 oz (50.44 kg)

## 2015-06-12 NOTE — Progress Notes (Signed)
   Weekly Management Note:  Outpatient    ICD-9-CM ICD-10-CM   1. Breast cancer of upper-outer quadrant of right female breast (HCC) 174.4 C50.411     Current Dose:   56 Gy  Projected Dose: 60 Gy   Narrative:  The patient presents for routine under treatment assessment.  CBCT/MVCT images/Port film x-rays were reviewed.  The chart was checked. Skin irritation is not that bothersome    Physical Findings:  weight is 110 lb 6.4 oz (50.077 kg). Her oral temperature is 98.3 F (36.8 C). Her blood pressure is 143/68 and her pulse is 61. Her respiration is 16.    Wt Readings from Last 3 Encounters:  06/12/15 110 lb 6.4 oz (50.077 kg)  06/03/15 111 lb 3.2 oz (50.44 kg)  05/27/15 111 lb 3.2 oz (50.44 kg)   Skin  Dry/ hyperpigmented over right breast, no moist peel  Impression:  The patient is tolerating radiotherapy.  Plan:  Continue radiotherapy as planned.    ________________________________   Eppie Gibson, M.D.

## 2015-06-13 ENCOUNTER — Other Ambulatory Visit (HOSPITAL_BASED_OUTPATIENT_CLINIC_OR_DEPARTMENT_OTHER): Payer: Medicare Other

## 2015-06-13 ENCOUNTER — Ambulatory Visit (HOSPITAL_BASED_OUTPATIENT_CLINIC_OR_DEPARTMENT_OTHER): Payer: Medicare Other

## 2015-06-13 ENCOUNTER — Ambulatory Visit
Admission: RE | Admit: 2015-06-13 | Discharge: 2015-06-13 | Disposition: A | Discharge status: Home / Self Care | Payer: Medicare Other | Source: Ambulatory Visit | Attending: Radiation Oncology | Admitting: Radiation Oncology

## 2015-06-13 VITALS — BP 128/74 | HR 61 | Temp 97.7°F | Resp 18

## 2015-06-13 DIAGNOSIS — C50411 Malignant neoplasm of upper-outer quadrant of right female breast: Secondary | ICD-10-CM | POA: Diagnosis present

## 2015-06-13 DIAGNOSIS — Z5111 Encounter for antineoplastic chemotherapy: Secondary | ICD-10-CM

## 2015-06-13 DIAGNOSIS — Z510 Encounter for antineoplastic radiation therapy: Secondary | ICD-10-CM | POA: Diagnosis not present

## 2015-06-13 LAB — COMPREHENSIVE METABOLIC PANEL
ALBUMIN: 4.1 g/dL (ref 3.5–5.0)
ALK PHOS: 76 U/L (ref 40–150)
ALT: 13 U/L (ref 0–55)
AST: 16 U/L (ref 5–34)
Anion Gap: 9 mEq/L (ref 3–11)
BILIRUBIN TOTAL: 0.54 mg/dL (ref 0.20–1.20)
BUN: 11.1 mg/dL (ref 7.0–26.0)
CO2: 23 meq/L (ref 22–29)
CREATININE: 0.8 mg/dL (ref 0.6–1.1)
Calcium: 9.7 mg/dL (ref 8.4–10.4)
Chloride: 108 mEq/L (ref 98–109)
EGFR: 74 mL/min/{1.73_m2} — ABNORMAL LOW (ref >90)
GLUCOSE: 82 mg/dL (ref 70–140)
Potassium: 4.2 mEq/L (ref 3.5–5.1)
SODIUM: 139 meq/L (ref 136–145)
TOTAL PROTEIN: 6.4 g/dL (ref 6.4–8.3)

## 2015-06-13 LAB — CBC WITH DIFFERENTIAL/PLATELET
BASO%: 0.5 % (ref 0.0–2.0)
Basophils Absolute: 0 10*3/uL (ref 0.0–0.1)
EOS ABS: 0.1 10*3/uL (ref 0.0–0.5)
EOS%: 1.9 % (ref 0.0–7.0)
HCT: 42.2 % (ref 34.8–46.6)
HEMOGLOBIN: 14 g/dL (ref 11.6–15.9)
LYMPH%: 20.3 % (ref 14.0–49.7)
MCH: 31.3 pg (ref 25.1–34.0)
MCHC: 33.1 g/dL (ref 31.5–36.0)
MCV: 94.6 fL (ref 79.5–101.0)
MONO#: 0.3 10*3/uL (ref 0.1–0.9)
MONO%: 8 % (ref 0.0–14.0)
NEUT%: 69.3 % (ref 38.4–76.8)
NEUTROS ABS: 2.8 10*3/uL (ref 1.5–6.5)
PLATELETS: 193 10*3/uL (ref 145–400)
RBC: 4.46 10*6/uL (ref 3.70–5.45)
RDW: 12.2 % (ref 11.2–14.5)
WBC: 4 10*3/uL (ref 3.9–10.3)
lymph#: 0.8 10*3/uL — ABNORMAL LOW (ref 0.9–3.3)

## 2015-06-13 MED ORDER — TRASTUZUMAB CHEMO INJECTION 440 MG
6.0000 mg/kg | Freq: Once | INTRAVENOUS | Status: AC
  Administered 2015-06-13: 294 mg via INTRAVENOUS
  Filled 2015-06-13: qty 14

## 2015-06-13 MED ORDER — DIPHENHYDRAMINE HCL 25 MG PO CAPS
ORAL_CAPSULE | ORAL | Status: AC
  Filled 2015-06-13: qty 1

## 2015-06-13 MED ORDER — SODIUM CHLORIDE 0.9 % IV SOLN
Freq: Once | INTRAVENOUS | Status: AC
Start: 2015-06-13 — End: 2015-06-13
  Administered 2015-06-13: 10:00:00 via INTRAVENOUS

## 2015-06-13 MED ORDER — ACETAMINOPHEN 325 MG PO TABS
ORAL_TABLET | ORAL | Status: AC
  Filled 2015-06-13: qty 2

## 2015-06-13 MED ORDER — DIPHENHYDRAMINE HCL 25 MG PO CAPS
25.0000 mg | ORAL_CAPSULE | Freq: Once | ORAL | Status: AC
  Administered 2015-06-13: 25 mg via ORAL

## 2015-06-13 MED ORDER — ACETAMINOPHEN 325 MG PO TABS
650.0000 mg | ORAL_TABLET | Freq: Once | ORAL | Status: AC
  Administered 2015-06-13: 650 mg via ORAL

## 2015-06-13 NOTE — Patient Instructions (Signed)
[facility] Discharge Instructions for Patients Receiving Chemotherapy  Today you received the following chemotherapy agents: Herceptin   To help prevent nausea and vomiting after your treatment, we encourage you to take your nausea medication as directed.    If you develop nausea and vomiting that is not controlled by your nausea medication, call the clinic.   BELOW ARE SYMPTOMS THAT SHOULD BE REPORTED IMMEDIATELY:  *FEVER GREATER THAN 100.5 F  *CHILLS WITH OR WITHOUT FEVER  NAUSEA AND VOMITING THAT IS NOT CONTROLLED WITH YOUR NAUSEA MEDICATION  *UNUSUAL SHORTNESS OF BREATH  *UNUSUAL BRUISING OR BLEEDING  TENDERNESS IN MOUTH AND THROAT WITH OR WITHOUT PRESENCE OF ULCERS  *URINARY PROBLEMS  *BOWEL PROBLEMS  UNUSUAL RASH Items with * indicate a potential emergency and should be followed up as soon as possible.  Feel free to call the clinic you have any questions or concerns. The clinic phone number is [phone].  Please show the CHEMO ALERT CARD at check-in to the Emergency Department and triage nurse.   

## 2015-06-14 ENCOUNTER — Ambulatory Visit
Admission: RE | Admit: 2015-06-14 | Discharge: 2015-06-14 | Disposition: A | Discharge status: Home / Self Care | Payer: Medicare Other | Source: Ambulatory Visit | Attending: Radiation Oncology | Admitting: Radiation Oncology

## 2015-06-14 ENCOUNTER — Encounter: Payer: Self-pay | Admitting: Radiation Oncology

## 2015-06-14 DIAGNOSIS — Z510 Encounter for antineoplastic radiation therapy: Secondary | ICD-10-CM | POA: Diagnosis not present

## 2015-06-14 DIAGNOSIS — C50411 Malignant neoplasm of upper-outer quadrant of right female breast: Secondary | ICD-10-CM | POA: Diagnosis not present

## 2015-06-18 ENCOUNTER — Telehealth: Payer: Self-pay | Admitting: *Deleted

## 2015-06-18 NOTE — Telephone Encounter (Signed)
This RN called pt per her call to [facility] on 12/31 for UTI symptoms.  Diana Dunn states post call she pushed more fluids and symptoms subsided.  " I kinda felt like I was getting an infection like before because I was having some pressure after going "  Post pushing fluids symptoms improved.  Pt denies any fever or buring.  Plan per this call is pt will monitor and continue to increase fluids- she understands to call if needed.

## 2015-06-26 NOTE — Progress Notes (Signed)
  Radiation Oncology         [phone] ________________________________  Name: Diana Dunn MRN: [id number]  Date: 06/14/2015  DOB: 12/22/1945  End of Treatment Note  Outpatient  DIAGNOSIS:    ICD-9-CM ICD-10-CM   1. Breast cancer of upper-outer quadrant of right female breast ([place])          Indication for treatment:  curative      Radiation treatment dates:  05/02/2015-06/14/2015  Site/dose:   1) Right breast / 50 Gy in 25 fractions 2) Right breast boost / 10 Gy in 5 fractions  Beams/energy:   1) 3D conformal tangents / 6 MV photons 2) En Face electrons / 9MeV   Narrative: The patient tolerated radiation treatment relatively well.      Plan: The patient has completed radiation treatment. The patient will return to radiation oncology clinic for routine followup in one month. I advised them to call or return sooner if they have any questions or concerns related to their recovery or treatment.  -----------------------------------  Eppie Gibson, MD

## 2015-07-04 ENCOUNTER — Other Ambulatory Visit: Payer: Self-pay

## 2015-07-04 ENCOUNTER — Ambulatory Visit: Payer: Self-pay

## 2015-07-05 ENCOUNTER — Ambulatory Visit (HOSPITAL_BASED_OUTPATIENT_CLINIC_OR_DEPARTMENT_OTHER): Payer: Medicare Other | Admitting: Oncology

## 2015-07-05 ENCOUNTER — Encounter: Payer: Self-pay | Admitting: *Deleted

## 2015-07-05 ENCOUNTER — Telehealth: Payer: Self-pay | Admitting: Oncology

## 2015-07-05 ENCOUNTER — Other Ambulatory Visit: Payer: Self-pay | Admitting: *Deleted

## 2015-07-05 ENCOUNTER — Other Ambulatory Visit (HOSPITAL_BASED_OUTPATIENT_CLINIC_OR_DEPARTMENT_OTHER): Payer: Medicare Other

## 2015-07-05 ENCOUNTER — Ambulatory Visit (HOSPITAL_BASED_OUTPATIENT_CLINIC_OR_DEPARTMENT_OTHER): Payer: Medicare Other

## 2015-07-05 VITALS — BP 127/68 | HR 65 | Temp 97.8°F | Resp 18 | Ht 66.0 in | Wt 111.4 lb

## 2015-07-05 DIAGNOSIS — Z5112 Encounter for antineoplastic immunotherapy: Secondary | ICD-10-CM | POA: Diagnosis present

## 2015-07-05 DIAGNOSIS — Z170 Estrogen receptor positive status [ER+]: Secondary | ICD-10-CM

## 2015-07-05 DIAGNOSIS — C50411 Malignant neoplasm of upper-outer quadrant of right female breast: Secondary | ICD-10-CM

## 2015-07-05 DIAGNOSIS — B370 Candidal stomatitis: Secondary | ICD-10-CM

## 2015-07-05 DIAGNOSIS — B3781 Candidal esophagitis: Secondary | ICD-10-CM

## 2015-07-05 DIAGNOSIS — M8580 Other specified disorders of bone density and structure, unspecified site: Secondary | ICD-10-CM

## 2015-07-05 LAB — COMPREHENSIVE METABOLIC PANEL
ALT: 15 U/L (ref 0–55)
AST: 15 U/L (ref 5–34)
Albumin: 4 g/dL (ref 3.5–5.0)
Alkaline Phosphatase: 73 U/L (ref 40–150)
Anion Gap: 8 mEq/L (ref 3–11)
BILIRUBIN TOTAL: 0.53 mg/dL (ref 0.20–1.20)
BUN: 14 mg/dL (ref 7.0–26.0)
CO2: 25 meq/L (ref 22–29)
Calcium: 9.4 mg/dL (ref 8.4–10.4)
Chloride: 107 mEq/L (ref 98–109)
Creatinine: 0.8 mg/dL (ref 0.6–1.1)
EGFR: 74 mL/min/{1.73_m2} — AB (ref >90)
Glucose: 96 mg/dl (ref 70–140)
Potassium: 4.2 mEq/L (ref 3.5–5.1)
SODIUM: 141 meq/L (ref 136–145)
TOTAL PROTEIN: 6.5 g/dL (ref 6.4–8.3)

## 2015-07-05 LAB — CBC WITH DIFFERENTIAL/PLATELET
BASO%: 0.3 % (ref 0.0–2.0)
Basophils Absolute: 0 10*3/uL (ref 0.0–0.1)
EOS%: 1.5 % (ref 0.0–7.0)
Eosinophils Absolute: 0.1 10*3/uL (ref 0.0–0.5)
HCT: 43.9 % (ref 34.8–46.6)
HGB: 14.8 g/dL (ref 11.6–15.9)
LYMPH%: 23.8 % (ref 14.0–49.7)
MCH: 31.8 pg (ref 25.1–34.0)
MCHC: 33.7 g/dL (ref 31.5–36.0)
MCV: 94.2 fL (ref 79.5–101.0)
MONO#: 0.3 10*3/uL (ref 0.1–0.9)
MONO%: 9.8 % (ref 0.0–14.0)
NEUT%: 64.6 % (ref 38.4–76.8)
NEUTROS ABS: 2.1 10*3/uL (ref 1.5–6.5)
Platelets: 177 10*3/uL (ref 145–400)
RBC: 4.66 10*6/uL (ref 3.70–5.45)
RDW: 12.3 % (ref 11.2–14.5)
WBC: 3.3 10*3/uL — AB (ref 3.9–10.3)
lymph#: 0.8 10*3/uL — ABNORMAL LOW (ref 0.9–3.3)

## 2015-07-05 MED ORDER — ACETAMINOPHEN 325 MG PO TABS
ORAL_TABLET | ORAL | Status: AC
  Filled 2015-07-05: qty 2

## 2015-07-05 MED ORDER — SODIUM CHLORIDE 0.9 % IV SOLN
Freq: Once | INTRAVENOUS | Status: AC
  Administered 2015-07-05: 11:00:00 via INTRAVENOUS

## 2015-07-05 MED ORDER — TRASTUZUMAB CHEMO INJECTION 440 MG
6.0000 mg/kg | Freq: Once | INTRAVENOUS | Status: AC
  Administered 2015-07-05: 294 mg via INTRAVENOUS
  Filled 2015-07-05: qty 14

## 2015-07-05 MED ORDER — ACETAMINOPHEN 325 MG PO TABS
650.0000 mg | ORAL_TABLET | Freq: Once | ORAL | Status: AC
  Administered 2015-07-05: 650 mg via ORAL

## 2015-07-05 NOTE — Telephone Encounter (Signed)
Called patient with her dexa and mammo appt

## 2015-07-05 NOTE — Patient Instructions (Signed)
[facility] Discharge Instructions for Patients Receiving Chemotherapy  Today you received the following chemotherapy agents: Herceptin   To help prevent nausea and vomiting after your treatment, we encourage you to take your nausea medication as directed.    If you develop nausea and vomiting that is not controlled by your nausea medication, call the clinic.   BELOW ARE SYMPTOMS THAT SHOULD BE REPORTED IMMEDIATELY:  *FEVER GREATER THAN 100.5 F  *CHILLS WITH OR WITHOUT FEVER  NAUSEA AND VOMITING THAT IS NOT CONTROLLED WITH YOUR NAUSEA MEDICATION  *UNUSUAL SHORTNESS OF BREATH  *UNUSUAL BRUISING OR BLEEDING  TENDERNESS IN MOUTH AND THROAT WITH OR WITHOUT PRESENCE OF ULCERS  *URINARY PROBLEMS  *BOWEL PROBLEMS  UNUSUAL RASH Items with * indicate a potential emergency and should be followed up as soon as possible.  Feel free to call the clinic you have any questions or concerns. The clinic phone number is [phone].  Please show the CHEMO ALERT CARD at check-in to the Emergency Department and triage nurse.   

## 2015-07-05 NOTE — Progress Notes (Signed)
[place]  [phone] [phone]     ID: Diana Dunn DOB: 68-29-1947  MR#: [id number]  EQA#:[id number]  Patient Care Team: Harle Battiest, MD as PCP - General (Obstetrics and Gynecology) Autumn Messing III, MD as Consulting Physician (General Surgery) Chauncey Cruel, MD as Consulting Physician (Oncology) Eppie Gibson, MD as Attending Physician (Radiation Oncology) Mauro Kaufmann, RN as Registered Nurse Rockwell Germany, RN as Registered Nurse Holley Bouche, NP as Nurse Practitioner (Nurse Practitioner) Lona Kettle, MD (Family Medicine) PCP: Harle Battiest, MD OTHER MD:  CHIEF COMPLAINT: Triple positive breast cancer  CURRENT TREATMENT:  Trastuzumab, tamoxifen  BREAST CANCER HISTORY: From the original intake note:  Diana Dunn had not had a mammogram for approximately 4 years. Sometime mid 2015 she had an area of redness and tenderness in the right breast and she brought this to her physician's attention. She was treated with antibiotics and this completely cleared.Marland Kitchen  Approximately mid April, she again developed redness over the right breast. This was very focal, and it did not look "as red" as the previous time. She brought it to Dr. Harrington Challenger is attention, and was treated with antibiotics. Bilateral mammography with tomosynthesis and right breast ultrasonography was also obtained on 09/19/2014. There was a focal opacity with irregular margins in the upper outer right breast with some central calcifications. There was mild architectural distortion associated with this. On exam Dr. [name] noted a firm palpable tender mass lateral to the right areola with overlying erythema. On ultrasonography there was an irregular hypoechoic mass measuring 2.4 cm.  Repeat right breast ultrasonography after the patient completed her antibiotic course was performed 10/04/2014. On exam there was still a palpable lobulated mass lateral to the right nipple and by ultrasound this  measured 2.6 cm. It was felt to be a possible abscess and a second course of antibiotics, now with Septra, was undertaken. After that treatment, repeat ultrasonography 10/16/2014 continued to show the palpable mass and ultrasonography now found the mass to measure 2.9 cm maximally.  Accordingly biopsy of the mass in question was obtained that same day, 10/16/2014, and showed ([id number]) and invasive ductal carcinoma, grade 2 or 3, estrogen receptor 90% positive, progesterone receptor 80% positive, both with strong staining intensity, with an MIB-1 of 20%, and with HER-2 amplification, the signals ratio being 3.60 and the number per cell 4.50.  Bilateral breast MRI 10/23/2014 showed a breast density to be category B. In the right breast there was an irregular enhancing mass at the 9:30 o'clock position measuring 3.1 cm. There were no additional worrisome masses in the right breast, no findings of concern in the left breast, and no abnormal appearing lymph nodes.  The patient's subsequent history is as detailed below  INTERVAL HISTORY: Diana Dunn returns today for follow-up of her  Estrogen receptor positive and HER-2 amplified breast cancer. She completed radiation in December 2016. Generally she did well with the treatments. She felt somewhat tired mother did not have significant " peeling". She was able to keep a walking program and she is currently walking 45 minutes most days. She continues on trastuzumab every 3 weeks with excellent tolerance. She is now ready to start antiestrogen therapy  REVIEW OF SYSTEMS:  Diana Dunn's port somehow became kinked and is no longer being used. She has had the last 2 Herceptin treatments peripherally and she has done very well with this. She wants to have the port removed because when she picks up her grandson it hits the port  on the left side. On the right side of course she still a bit sore from her surgery and radiation). She tells me this is being arranged with Dr.  [name] likely for the next week or 2. --She does have some soreness on the right side which is expected at this point. She understands those pains are normal for the surgery she had and do not indicate breast cancer  Recurrence. Her appetite is better. Her hair is coming in the same collar and the same curliness as before. A detailed review of systems today was otherwise stable  PAST MEDICAL HISTORY: Past Medical History  Diagnosis Date  . Breast cancer of upper-outer quadrant of right female breast ([place]) 10/19/2014  . Breast cancer ([place])   . History of hiatal hernia   . IBS (irritable bowel syndrome)   . Lactose intolerance   . Cancer [facility])     RIGHT BREAST CANCER  Irritable bowel symptoms  PAST SURGICAL HISTORY: Past Surgical History  Procedure Laterality Date  . Breast surgery      BIOPSY OF BREAST  . Colonoscopy    . Portacath placement Left 11/02/2014    Procedure: INSERTION PORT-A-CATH;  Surgeon: Autumn Messing III, MD;  Location: [facility];  Service: General;  Laterality: Left;  . Radioactive seed guided mastectomy with axillary sentinel lymph node biopsy Right 03/28/2015    Procedure: RADIOACTIVE SEED GUIDED PARTIAL MASTECTOMY WITH AXILLARY SENTINEL LYMPH NODE BIOPSY;  Surgeon: Autumn Messing III, MD;  Location: [place];  Service: General;  Laterality: Right;    FAMILY HISTORY No family history on file. The patient's father died at the age of 79 from a myocardial infarction. The patient's mother died at the age of 66 following a stroke. The patient had 2 brothers. One had Down's syndrome. She had no sisters. The only breast cancer in the family was the patient's father's only sister was diagnosed with breast cancer in her 39s. There is no history of ovarian cancer in the family to the patient's knowledge   GYNECOLOGIC HISTORY:  No LMP recorded. Patient is postmenopausal.  menarche age 75, first live birth age 35, she is [place] P2. She went through the change of life in late 1999.  She did not take hormone replacement. She took birth control pills for less than 2 years in her 16W, with no complications   SOCIAL HISTORY:  Diana Dunn is my neighbor. 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 Bill died from widely metastatic malignant melanoma within 4 months of diagnosis some years ago. The patient lives with her son [name] and his wife, who is expecting their first child late November 2016. The other son, [name], lives in [place]. Both sons are appraised her's. The patient has 2 grandchildren and as just noted one on the way. The patient attends the [place]: Not in place. At the 10/24/2014 visit the patient was given the appropriate documents 2 complete and notarize at her discretion   HEALTH MAINTENANCE: Social History  Substance Use Topics  . Smoking status: Former Smoker    Types: Cigarettes    Quit date: 08/03/2014  . Smokeless tobacco: Not on file  . Alcohol Use: 3.6 oz/week    6 Glasses of wine per week     Colonoscopy: 2000?  PAP:  Bone density: On wrist, remote   Lipid panel:  Allergies  Allergen Reactions  . Aspirin  Stomach pain  . Aspirin Nausea Only    GI upset/pain    Current Outpatient Prescriptions  Medication Sig Dispense Refill  . cholecalciferol (VITAMIN D) 1000 UNITS tablet Take 1 tablet (1,000 Units total) by mouth daily.     No current facility-administered medications for this visit.    OBJECTIVE: middle-aged white woman who appears stated age 64 Vitals:   07/05/15 0931  BP: 127/68  Pulse: 65  Temp: 97.8 F (36.6 C)  Resp: 18     Body mass index is 17.99 kg/(m^2).    ECOG FS:0 - Asymptomatic Filed Weights   07/05/15 0931  Weight: 111 lb 6.4 oz (50.531 kg)   baseline weight 122 pounds  Sclerae unicteric, EOMs intact Oropharynx clear, dentition in good repair No cervical or supraclavicular adenopathy Lungs no rales or rhonchi Heart  regular rate and rhythm Abd soft, nontender, positive bowel sounds MSK no focal spinal tenderness, no upper extremity lymphedema Neuro: nonfocal, well oriented, appropriate affect Breasts:  The right breast is status post lumpectomy and radiation. There is minimal erythema over the radiation port area. The cosmetic result is good. There is no evidence of local recurrence. The right axilla is benign. There is a palpable mass in the superior right axilla consistent with a fluid collection. The left breast is unremarkable   LAB RESULTS:  CMP     Component Value Date/Time   NA 141 07/05/2015 0920   NA 140 11/01/2014 1023   K 4.2 07/05/2015 0920   K 4.2 11/01/2014 1023   CL 104 11/01/2014 1023   CO2 25 07/05/2015 0920   CO2 26 11/01/2014 1023   GLUCOSE 96 07/05/2015 0920   GLUCOSE 93 11/01/2014 1023   BUN 14.0 07/05/2015 0920   BUN 10 11/01/2014 1023   CREATININE 0.8 07/05/2015 0920   CREATININE 0.85 11/01/2014 1023   CALCIUM 9.4 07/05/2015 0920   CALCIUM 9.9 11/01/2014 1023   PROT 6.5 07/05/2015 0920   ALBUMIN 4.0 07/05/2015 0920   AST 15 07/05/2015 0920   ALT 15 07/05/2015 0920   ALKPHOS 73 07/05/2015 0920   BILITOT 0.53 07/05/2015 0920   GFRNONAA >60 11/01/2014 1023   GFRAA >60 11/01/2014 1023    INo results found for: SPEP, UPEP  Lab Results  Component Value Date   WBC 3.3* 07/05/2015   NEUTROABS 2.1 07/05/2015   HGB 14.8 07/05/2015   HCT 43.9 07/05/2015   MCV 94.2 07/05/2015   PLT 177 07/05/2015      Chemistry      Component Value Date/Time   NA 141 07/05/2015 0920   NA 140 11/01/2014 1023   K 4.2 07/05/2015 0920   K 4.2 11/01/2014 1023   CL 104 11/01/2014 1023   CO2 25 07/05/2015 0920   CO2 26 11/01/2014 1023   BUN 14.0 07/05/2015 0920   BUN 10 11/01/2014 1023   CREATININE 0.8 07/05/2015 0920   CREATININE 0.85 11/01/2014 1023      Component Value Date/Time   CALCIUM 9.4 07/05/2015 0920   CALCIUM 9.9 11/01/2014 1023   ALKPHOS 73 07/05/2015 0920    AST 15 07/05/2015 0920   ALT 15 07/05/2015 0920   BILITOT 0.53 07/05/2015 0920       No results found for: LABCA2  No components found for: LABCA125  No results for input(s): INR in the last 168 hours.  Urinalysis    Component Value Date/Time   LABSPEC 1.020 12/21/2014 1405   PHURINE 5.0 12/21/2014 1405   GLUCOSEU Negative 12/21/2014  [place] Negative 12/21/2014 1405   BILIRUBINUR Negative 12/21/2014 1405   KETONESUR Negative 12/21/2014 1405   PROTEINUR < 30 12/21/2014 1405   UROBILINOGEN 0.2 12/21/2014 1405   NITRITE Negative 12/21/2014 1405   LEUKOCYTESUR Small 12/21/2014 1405    STUDIES: No results found.  ASSESSMENT: 70 y.o. Diana Dunn woman status post right breast biopsy 10/16/2014 for a clinical T3 N0, stage IIA invasive ductal carcinoma, grade 2 or 3, estrogen and progesterone receptor positive, with an MIB-1 of 20%, and HER-2 amplified, with a signals ratio of 3.60  (1) chemotherapy and anti-HER-2 immunotherapy started 11/05/2014, consisting of carboplatin, docetaxel, trastuzumab and pertuzumab given every 21 days 6, with Neulasta support-- completed 02/28/2015  (2) trastuzumab will be continued to complete 1 year (through may 2017)  (a) echocardiogram 05/15/2015 shows an ejection fraction 55-60%  (3) right lumpectomy and sentinel lymph node sampling 03/28/2015 showed a pT1c pN0 residual invasive ductal carcinoma, grade 2, with negative margins.  (4) adjuvant radiation 05/02/2015-06/14/2015  (5)  Tamoxifen started January 2017  PLAN: Winry has completed her local treatment for her breast cancer and is now ready to start her anti-estrogens.   We discussed the benefits of antiestrogen therapy, which will essentially cut in half her risk of breast cancer (both local recurrence, systemic recurrence, and a new breast cancer developing).  We then discussed the difference between anastrozole and tamoxifen. She has a good understanding of the possible  toxicities, side effects and complications of these agents.  We're going to start with tamoxifen, partly because she is slight and likely has osteopenia. Certainly this runs in her family. She was able to take oral contraceptives for many years with no clots so I expect the risk of clots from tamoxifen will be very low. I went ahead and place a prescription for her and asked her to go ahead and start.  She is going to see [place] early March just to make sure she is not having any unusual side effects, and then she will see me again mid April. Before the mid April visit she will have an echocardiogram , repeat mammography, and a bone density scan.   In the meantime she is happy to have her Herceptin peripherally and wishes to have her port removed. This is being operationalized through Dr. Ethlyn Gallery office.   I think [place] would be a good candidate for "finding your new normal" and I gave her a copy of the flyer.   She knows to call for any problems that may develop before her next visit here.  Chauncey Cruel, MD   07/05/2015 10:11 AM

## 2015-07-12 ENCOUNTER — Other Ambulatory Visit: Payer: Self-pay | Admitting: *Deleted

## 2015-07-12 MED ORDER — TAMOXIFEN CITRATE 20 MG PO TABS: 20.0000 mg | ORAL_TABLET | Freq: Every day | ORAL | Status: DC

## 2015-07-15 NOTE — Progress Notes (Signed)
[employer] representative Note 05-31-15  Diagnosis: Breast Cancer   The patient's CT images from her initial simulation were reviewed to plan her boost treatment to her right breast  lumpectomy cavity.  Measurements were made regarding the size and depth of the surgical bed. The boost to the lumpectomy cavity will be delivered with 9 MeV electrons; 10 Gy in 5 fractions has been prescribed to the 100% isodose line.   An electron Best boy was reviewed and approved.  A custom electron cut-out will be used for her boost field.    -----------------------------------  Eppie Gibson, MD

## 2015-07-19 ENCOUNTER — Ambulatory Visit
Admission: RE | Admit: 2015-07-19 | Discharge: 2015-07-19 | Disposition: A | Discharge status: Home / Self Care | Payer: Medicare Other | Source: Ambulatory Visit | Attending: Radiation Oncology | Admitting: Radiation Oncology

## 2015-07-19 VITALS — BP 125/60 | HR 56 | Temp 98.0°F | Ht 66.0 in | Wt 113.4 lb

## 2015-07-19 DIAGNOSIS — C50411 Malignant neoplasm of upper-outer quadrant of right female breast: Secondary | ICD-10-CM

## 2015-07-19 NOTE — Progress Notes (Signed)
Ms. Eudy is here for follow up of radiation completed 06/14/15 to her Right Breast. She admits to occasional fatigue. She is still receiving Herceptin from Dr. Jana Hakim and will take it through May. She denies any pain. She reports her skin is improving, and she is using a cream that has cocoa butter in it with good results.   BP 125/60 mmHg  Pulse 56  Temp(Src) 98 F (36.7 C)  Ht 5\' 6"  (1.676 m)  Wt 113 lb 6.4 oz (51.438 kg)  BMI 18.31 kg/m2

## 2015-07-19 NOTE — Progress Notes (Signed)
  Radiation Oncology         [phone] ________________________________  Name: Diana Dunn MRN: [id number]  Date: 07/19/2015  DOB: 09-21-1945  Follow-Up Visit Note  Outpatient  CC: Harle Battiest, MD  Jovita Kussmaul, MD  Diagnosis and Prior Radiotherapy:    ICD-9-CM ICD-10-CM   1. 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 She admits to occasional fatigue. She is still receiving Herceptin from Dr. Jana Hakim and will take it through May. She denies any pain. She reports her skin is improving, and she is using a cream that has cocoa butter in it with good results. She reports that she is tired at times, but is still able to walk. She has been walking every day for the past week and a half.  On Tamoxifen.   ALLERGIES:  is allergic to aspirin and aspirin.  Meds: Current Outpatient Prescriptions  Medication Sig Dispense Refill  . cholecalciferol (VITAMIN D) 1000 UNITS tablet Take 1 tablet (1,000 Units total) by mouth daily.    . tamoxifen (NOLVADEX) 20 MG tablet Take 1 tablet (20 mg total) by mouth daily. 30 tablet 3   No current facility-administered medications for this encounter.    Physical Findings: The patient is in no acute distress. Patient is alert and oriented.  height is 5\' 6"  (1.676 m) and weight is 113 lb 6.4 oz (51.438 kg). Her temperature is 98 F (36.7 C). Her blood pressure is 125/60 and her pulse is 56. Marland Kitchen General: Alert and oriented, in no acute distress Neck: Neck is supple, no palpable cervical or  supraclavicular lymphadenopathy. Right breast is still slightly hyperpigmented with some residual dark desquamation around the nipple. Skin is intact otherwise.    Lab Findings: Lab Results  Component Value Date   WBC 3.3* 07/05/2015   HGB 14.8 07/05/2015   HCT 43.9 07/05/2015   MCV 94.2 07/05/2015   PLT 177 07/05/2015    Radiographic Findings: No results found.  Impression/Plan: Healing well.  I encouraged her to continue with yearly mammography and followup with medical oncology. I discussed Survivorship with her and that if she was interested she could have an appointment made. I will see her back on an as-needed basis. I have encouraged her to call if she has any issues or concerns in the future. I wished her the very best.  _____________________________________   Eppie Gibson, MD    This document serves as a record of services personally performed by Eppie Gibson, MD. It was created on her behalf by Lendon Collar, a trained medical scribe. The creation of this record is based on the scribe's personal observations and the provider's statements to them. This document has been checked and approved by the attending provider.

## 2015-07-25 ENCOUNTER — Ambulatory Visit: Payer: Medicare Other

## 2015-07-26 ENCOUNTER — Ambulatory Visit (HOSPITAL_BASED_OUTPATIENT_CLINIC_OR_DEPARTMENT_OTHER): Payer: Medicare Other

## 2015-07-26 ENCOUNTER — Other Ambulatory Visit (HOSPITAL_BASED_OUTPATIENT_CLINIC_OR_DEPARTMENT_OTHER): Payer: Medicare Other

## 2015-07-26 VITALS — BP 147/66 | HR 60 | Temp 97.8°F | Resp 18

## 2015-07-26 DIAGNOSIS — Z5112 Encounter for antineoplastic immunotherapy: Secondary | ICD-10-CM | POA: Diagnosis present

## 2015-07-26 DIAGNOSIS — C50411 Malignant neoplasm of upper-outer quadrant of right female breast: Secondary | ICD-10-CM | POA: Diagnosis present

## 2015-07-26 LAB — COMPREHENSIVE METABOLIC PANEL
ALT: 12 U/L (ref 0–55)
AST: 16 U/L (ref 5–34)
Albumin: 4.1 g/dL (ref 3.5–5.0)
Alkaline Phosphatase: 83 U/L (ref 40–150)
Anion Gap: 11 mEq/L (ref 3–11)
BUN: 17.5 mg/dL (ref 7.0–26.0)
CHLORIDE: 106 meq/L (ref 98–109)
CO2: 23 meq/L (ref 22–29)
CREATININE: 0.8 mg/dL (ref 0.6–1.1)
Calcium: 9.4 mg/dL (ref 8.4–10.4)
EGFR: 72 mL/min/{1.73_m2} — ABNORMAL LOW (ref >90)
GLUCOSE: 108 mg/dL (ref 70–140)
Potassium: 4.3 mEq/L (ref 3.5–5.1)
Sodium: 140 mEq/L (ref 136–145)
Total Bilirubin: 0.67 mg/dL (ref 0.20–1.20)
Total Protein: 6.5 g/dL (ref 6.4–8.3)

## 2015-07-26 LAB — CBC WITH DIFFERENTIAL/PLATELET
BASO%: 0.3 % (ref 0.0–2.0)
BASOS ABS: 0 10*3/uL (ref 0.0–0.1)
EOS ABS: 0.1 10*3/uL (ref 0.0–0.5)
EOS%: 1.3 % (ref 0.0–7.0)
HEMATOCRIT: 41.6 % (ref 34.8–46.6)
HEMOGLOBIN: 14.4 g/dL (ref 11.6–15.9)
LYMPH#: 0.8 10*3/uL — AB (ref 0.9–3.3)
LYMPH%: 19.4 % (ref 14.0–49.7)
MCH: 32.3 pg (ref 25.1–34.0)
MCHC: 34.6 g/dL (ref 31.5–36.0)
MCV: 93.3 fL (ref 79.5–101.0)
MONO#: 0.3 10*3/uL (ref 0.1–0.9)
MONO%: 8.1 % (ref 0.0–14.0)
NEUT#: 2.8 10*3/uL (ref 1.5–6.5)
NEUT%: 70.9 % (ref 38.4–76.8)
NRBC: 0 % (ref 0–0)
PLATELETS: 191 10*3/uL (ref 145–400)
RBC: 4.46 10*6/uL (ref 3.70–5.45)
RDW: 12.7 % (ref 11.2–14.5)
WBC: 4 10*3/uL (ref 3.9–10.3)

## 2015-07-26 MED ORDER — ACETAMINOPHEN 325 MG PO TABS
ORAL_TABLET | ORAL | Status: AC
  Filled 2015-07-26: qty 2

## 2015-07-26 MED ORDER — TRASTUZUMAB CHEMO INJECTION 440 MG
6.0000 mg/kg | Freq: Once | INTRAVENOUS | Status: AC
  Administered 2015-07-26: 294 mg via INTRAVENOUS
  Filled 2015-07-26: qty 14

## 2015-07-26 MED ORDER — DIPHENHYDRAMINE HCL 25 MG PO CAPS
25.0000 mg | ORAL_CAPSULE | Freq: Once | ORAL | Status: AC
  Administered 2015-07-26: 25 mg via ORAL

## 2015-07-26 MED ORDER — ACETAMINOPHEN 325 MG PO TABS
650.0000 mg | ORAL_TABLET | Freq: Once | ORAL | Status: AC
  Administered 2015-07-26: 650 mg via ORAL

## 2015-07-26 MED ORDER — DIPHENHYDRAMINE HCL 25 MG PO CAPS
ORAL_CAPSULE | ORAL | Status: AC
  Filled 2015-07-26: qty 1

## 2015-07-26 MED ORDER — SODIUM CHLORIDE 0.9 % IV SOLN
Freq: Once | INTRAVENOUS | Status: AC
  Administered 2015-07-26: 11:00:00 via INTRAVENOUS

## 2015-07-26 NOTE — Patient Instructions (Signed)
[facility] Discharge Instructions for Patients Receiving Chemotherapy  Today you received the following chemotherapy agents:  Herceptin  To help prevent nausea and vomiting after your treatment, we encourage you to take your nausea medication as prescribed.   If you develop nausea and vomiting that is not controlled by your nausea medication, call the clinic.   BELOW ARE SYMPTOMS THAT SHOULD BE REPORTED IMMEDIATELY:  *FEVER GREATER THAN 100.5 F  *CHILLS WITH OR WITHOUT FEVER  NAUSEA AND VOMITING THAT IS NOT CONTROLLED WITH YOUR NAUSEA MEDICATION  *UNUSUAL SHORTNESS OF BREATH  *UNUSUAL BRUISING OR BLEEDING  TENDERNESS IN MOUTH AND THROAT WITH OR WITHOUT PRESENCE OF ULCERS  *URINARY PROBLEMS  *BOWEL PROBLEMS  UNUSUAL RASH Items with * indicate a potential emergency and should be followed up as soon as possible.  Feel free to call the clinic you have any questions or concerns. The clinic phone number is [phone].  Please show the CHEMO ALERT CARD at check-in to the Emergency Department and triage nurse.   

## 2015-08-01 ENCOUNTER — Encounter (HOSPITAL_BASED_OUTPATIENT_CLINIC_OR_DEPARTMENT_OTHER): Payer: Self-pay

## 2015-08-01 ENCOUNTER — Ambulatory Visit (HOSPITAL_BASED_OUTPATIENT_CLINIC_OR_DEPARTMENT_OTHER): Admit: 2015-08-01 | Payer: Self-pay | Admitting: General Surgery

## 2015-08-01 DIAGNOSIS — Z853 Personal history of malignant neoplasm of breast: Secondary | ICD-10-CM | POA: Diagnosis not present

## 2015-08-01 DIAGNOSIS — Z452 Encounter for adjustment and management of vascular access device: Secondary | ICD-10-CM | POA: Diagnosis not present

## 2015-08-01 SURGERY — REMOVAL PORT-A-CATH
Anesthesia: LOCAL

## 2015-08-13 ENCOUNTER — Telehealth: Payer: Self-pay | Admitting: Oncology

## 2015-08-13 NOTE — Telephone Encounter (Signed)
Due to HB out 3/3 f/u moved to 3/24 w/next tx per HB. [place] lab/tx 3/3 - start time remains the same - left message for patient re changes for 3/36 and 3/24. Patient to get new schedule 3/3.

## 2015-08-15 ENCOUNTER — Ambulatory Visit: Payer: Medicare Other | Admitting: Nurse Practitioner

## 2015-08-15 ENCOUNTER — Other Ambulatory Visit: Payer: Self-pay | Admitting: *Deleted

## 2015-08-15 ENCOUNTER — Ambulatory Visit: Payer: Medicare Other

## 2015-08-15 DIAGNOSIS — C50411 Malignant neoplasm of upper-outer quadrant of right female breast: Secondary | ICD-10-CM

## 2015-08-16 ENCOUNTER — Ambulatory Visit: Payer: Medicare Other | Admitting: Nurse Practitioner

## 2015-08-16 ENCOUNTER — Other Ambulatory Visit: Payer: Self-pay | Admitting: Oncology

## 2015-08-16 ENCOUNTER — Other Ambulatory Visit (HOSPITAL_BASED_OUTPATIENT_CLINIC_OR_DEPARTMENT_OTHER): Payer: Medicare Other

## 2015-08-16 ENCOUNTER — Ambulatory Visit (HOSPITAL_BASED_OUTPATIENT_CLINIC_OR_DEPARTMENT_OTHER): Payer: Medicare Other

## 2015-08-16 VITALS — BP 123/52 | HR 56 | Resp 18

## 2015-08-16 DIAGNOSIS — Z5112 Encounter for antineoplastic immunotherapy: Secondary | ICD-10-CM

## 2015-08-16 DIAGNOSIS — C50411 Malignant neoplasm of upper-outer quadrant of right female breast: Secondary | ICD-10-CM

## 2015-08-16 LAB — COMPREHENSIVE METABOLIC PANEL WITH GFR
ALT: 11 U/L (ref 0–55)
AST: 15 U/L (ref 5–34)
Albumin: 4 g/dL (ref 3.5–5.0)
Alkaline Phosphatase: 73 U/L (ref 40–150)
Anion Gap: 8 meq/L (ref 3–11)
BUN: 14.1 mg/dL (ref 7.0–26.0)
CO2: 24 meq/L (ref 22–29)
Calcium: 9.1 mg/dL (ref 8.4–10.4)
Chloride: 107 meq/L (ref 98–109)
Creatinine: 0.9 mg/dL (ref 0.6–1.1)
EGFR: 66 ml/min/1.73 m2 — ABNORMAL LOW
Glucose: 92 mg/dL (ref 70–140)
Potassium: 4.4 meq/L (ref 3.5–5.1)
Sodium: 140 meq/L (ref 136–145)
Total Bilirubin: 0.57 mg/dL (ref 0.20–1.20)
Total Protein: 6.4 g/dL (ref 6.4–8.3)

## 2015-08-16 LAB — CBC WITH DIFFERENTIAL/PLATELET
BASO%: 0.7 % (ref 0.0–2.0)
Basophils Absolute: 0 10*3/uL (ref 0.0–0.1)
EOS ABS: 0 10*3/uL (ref 0.0–0.5)
EOS%: 0.9 % (ref 0.0–7.0)
HEMATOCRIT: 41.5 % (ref 34.8–46.6)
HEMOGLOBIN: 13.9 g/dL (ref 11.6–15.9)
LYMPH#: 0.9 10*3/uL (ref 0.9–3.3)
LYMPH%: 22.8 % (ref 14.0–49.7)
MCH: 31.8 pg (ref 25.1–34.0)
MCHC: 33.4 g/dL (ref 31.5–36.0)
MCV: 95.3 fL (ref 79.5–101.0)
MONO#: 0.3 10*3/uL (ref 0.1–0.9)
MONO%: 7.3 % (ref 0.0–14.0)
NEUT%: 68.3 % (ref 38.4–76.8)
NEUTROS ABS: 2.6 10*3/uL (ref 1.5–6.5)
PLATELETS: 183 10*3/uL (ref 145–400)
RBC: 4.36 10*6/uL (ref 3.70–5.45)
RDW: 13.3 % (ref 11.2–14.5)
WBC: 3.8 10*3/uL — AB (ref 3.9–10.3)

## 2015-08-16 MED ORDER — TRASTUZUMAB CHEMO INJECTION 440 MG
6.0000 mg/kg | Freq: Once | INTRAVENOUS | Status: AC
  Administered 2015-08-16: 294 mg via INTRAVENOUS
  Filled 2015-08-16: qty 14

## 2015-08-16 MED ORDER — ACETAMINOPHEN 325 MG PO TABS
650.0000 mg | ORAL_TABLET | Freq: Once | ORAL | Status: AC
  Administered 2015-08-16: 650 mg via ORAL

## 2015-08-16 MED ORDER — ACETAMINOPHEN 325 MG PO TABS
ORAL_TABLET | ORAL | Status: AC
  Filled 2015-08-16: qty 2

## 2015-08-16 MED ORDER — SODIUM CHLORIDE 0.9 % IV SOLN
Freq: Once | INTRAVENOUS | Status: AC
  Administered 2015-08-16: 11:00:00 via INTRAVENOUS

## 2015-08-16 MED ORDER — DIPHENHYDRAMINE HCL 25 MG PO CAPS: 25.0000 mg | ORAL_CAPSULE | Freq: Once | ORAL | Status: DC

## 2015-08-16 NOTE — Patient Instructions (Signed)
[facility] Discharge Instructions for Patients Receiving Chemotherapy  Today you received the following chemotherapy agents: Herceptin   To help prevent nausea and vomiting after your treatment, we encourage you to take your nausea medication as directed.    If you develop nausea and vomiting that is not controlled by your nausea medication, call the clinic.   BELOW ARE SYMPTOMS THAT SHOULD BE REPORTED IMMEDIATELY:  *FEVER GREATER THAN 100.5 F  *CHILLS WITH OR WITHOUT FEVER  NAUSEA AND VOMITING THAT IS NOT CONTROLLED WITH YOUR NAUSEA MEDICATION  *UNUSUAL SHORTNESS OF BREATH  *UNUSUAL BRUISING OR BLEEDING  TENDERNESS IN MOUTH AND THROAT WITH OR WITHOUT PRESENCE OF ULCERS  *URINARY PROBLEMS  *BOWEL PROBLEMS  UNUSUAL RASH Items with * indicate a potential emergency and should be followed up as soon as possible.  Feel free to call the clinic you have any questions or concerns. The clinic phone number is [phone].  Please show the CHEMO ALERT CARD at check-in to the Emergency Department and triage nurse.   

## 2015-08-16 NOTE — Progress Notes (Signed)
Dr. Jana Hakim aware with echo being overdue.  Pt scheduled for echo on 3/21.  Magrinat, MD given verbal order okay to proceed with today's Herceptin treatment.

## 2015-09-03 ENCOUNTER — Ambulatory Visit (HOSPITAL_COMMUNITY): Payer: Medicare Other

## 2015-09-05 ENCOUNTER — Other Ambulatory Visit: Payer: Self-pay | Admitting: *Deleted

## 2015-09-05 DIAGNOSIS — C50411 Malignant neoplasm of upper-outer quadrant of right female breast: Secondary | ICD-10-CM

## 2015-09-06 ENCOUNTER — Ambulatory Visit (HOSPITAL_BASED_OUTPATIENT_CLINIC_OR_DEPARTMENT_OTHER): Payer: Medicare Other | Admitting: Nurse Practitioner

## 2015-09-06 ENCOUNTER — Ambulatory Visit (HOSPITAL_COMMUNITY)
Admission: RE | Admit: 2015-09-06 | Discharge: 2015-09-06 | Disposition: A | Discharge status: Home / Self Care | Payer: Medicare Other | Source: Ambulatory Visit | Attending: Oncology | Admitting: Oncology

## 2015-09-06 ENCOUNTER — Encounter: Payer: Self-pay | Admitting: Nurse Practitioner

## 2015-09-06 ENCOUNTER — Other Ambulatory Visit (HOSPITAL_BASED_OUTPATIENT_CLINIC_OR_DEPARTMENT_OTHER): Payer: Medicare Other

## 2015-09-06 ENCOUNTER — Encounter: Payer: Self-pay | Admitting: *Deleted

## 2015-09-06 ENCOUNTER — Ambulatory Visit (HOSPITAL_BASED_OUTPATIENT_CLINIC_OR_DEPARTMENT_OTHER): Payer: Medicare Other

## 2015-09-06 VITALS — BP 136/54 | HR 57 | Temp 97.9°F | Resp 16 | Wt 113.4 lb

## 2015-09-06 DIAGNOSIS — B370 Candidal stomatitis: Secondary | ICD-10-CM | POA: Insufficient documentation

## 2015-09-06 DIAGNOSIS — Z5112 Encounter for antineoplastic immunotherapy: Secondary | ICD-10-CM | POA: Diagnosis present

## 2015-09-06 DIAGNOSIS — C50411 Malignant neoplasm of upper-outer quadrant of right female breast: Secondary | ICD-10-CM | POA: Diagnosis not present

## 2015-09-06 DIAGNOSIS — Z170 Estrogen receptor positive status [ER+]: Secondary | ICD-10-CM | POA: Diagnosis not present

## 2015-09-06 DIAGNOSIS — Z0189 Encounter for other specified special examinations: Secondary | ICD-10-CM | POA: Diagnosis not present

## 2015-09-06 DIAGNOSIS — B3781 Candidal esophagitis: Secondary | ICD-10-CM | POA: Diagnosis not present

## 2015-09-06 LAB — COMPREHENSIVE METABOLIC PANEL
ALBUMIN: 3.7 g/dL (ref 3.5–5.0)
ALK PHOS: 62 U/L (ref 40–150)
ALT: 10 U/L (ref 0–55)
ANION GAP: 7 meq/L (ref 3–11)
AST: 15 U/L (ref 5–34)
BILIRUBIN TOTAL: 0.48 mg/dL (ref 0.20–1.20)
BUN: 15.5 mg/dL (ref 7.0–26.0)
CALCIUM: 8.8 mg/dL (ref 8.4–10.4)
CO2: 25 mEq/L (ref 22–29)
CREATININE: 0.9 mg/dL (ref 0.6–1.1)
Chloride: 110 mEq/L — ABNORMAL HIGH (ref 98–109)
EGFR: 71 mL/min/{1.73_m2} — ABNORMAL LOW (ref >90)
Glucose: 83 mg/dl (ref 70–140)
Potassium: 4.1 mEq/L (ref 3.5–5.1)
Sodium: 143 mEq/L (ref 136–145)
TOTAL PROTEIN: 6.1 g/dL — AB (ref 6.4–8.3)

## 2015-09-06 LAB — CBC WITH DIFFERENTIAL/PLATELET
BASO%: 0.4 % (ref 0.0–2.0)
BASOS ABS: 0 10*3/uL (ref 0.0–0.1)
EOS%: 2.6 % (ref 0.0–7.0)
Eosinophils Absolute: 0.1 10*3/uL (ref 0.0–0.5)
HCT: 40.8 % (ref 34.8–46.6)
HEMOGLOBIN: 13.8 g/dL (ref 11.6–15.9)
LYMPH#: 0.9 10*3/uL (ref 0.9–3.3)
LYMPH%: 26.4 % (ref 14.0–49.7)
MCH: 32.5 pg (ref 25.1–34.0)
MCHC: 33.9 g/dL (ref 31.5–36.0)
MCV: 95.9 fL (ref 79.5–101.0)
MONO#: 0.3 10*3/uL (ref 0.1–0.9)
MONO%: 9.4 % (ref 0.0–14.0)
NEUT#: 2 10*3/uL (ref 1.5–6.5)
NEUT%: 61.2 % (ref 38.4–76.8)
PLATELETS: 179 10*3/uL (ref 145–400)
RBC: 4.25 10*6/uL (ref 3.70–5.45)
RDW: 13.1 % (ref 11.2–14.5)
WBC: 3.2 10*3/uL — ABNORMAL LOW (ref 3.9–10.3)

## 2015-09-06 LAB — ECHOCARDIOGRAM COMPLETE: WEIGHTICAEL: 1814.4 [oz_av]

## 2015-09-06 MED ORDER — ACETAMINOPHEN 325 MG PO TABS
ORAL_TABLET | ORAL | Status: AC
  Filled 2015-09-06: qty 2

## 2015-09-06 MED ORDER — ACETAMINOPHEN 325 MG PO TABS
650.0000 mg | ORAL_TABLET | Freq: Once | ORAL | Status: AC
  Administered 2015-09-06: 650 mg via ORAL

## 2015-09-06 MED ORDER — SODIUM CHLORIDE 0.9 % IV SOLN
Freq: Once | INTRAVENOUS | Status: AC
  Administered 2015-09-06: 11:00:00 via INTRAVENOUS

## 2015-09-06 MED ORDER — TRASTUZUMAB CHEMO INJECTION 440 MG
6.0000 mg/kg | Freq: Once | INTRAVENOUS | Status: AC
  Administered 2015-09-06: 294 mg via INTRAVENOUS
  Filled 2015-09-06: qty 14

## 2015-09-06 NOTE — Patient Instructions (Signed)
[facility] Discharge Instructions for Patients Receiving Chemotherapy  Today you received the following chemotherapy agent: Herceptin   To help prevent nausea and vomiting after your treatment, we encourage you to take your nausea medication as prescribed.    If you develop nausea and vomiting that is not controlled by your nausea medication, call the clinic.   BELOW ARE SYMPTOMS THAT SHOULD BE REPORTED IMMEDIATELY:  *FEVER GREATER THAN 100.5 F  *CHILLS WITH OR WITHOUT FEVER  NAUSEA AND VOMITING THAT IS NOT CONTROLLED WITH YOUR NAUSEA MEDICATION  *UNUSUAL SHORTNESS OF BREATH  *UNUSUAL BRUISING OR BLEEDING  TENDERNESS IN MOUTH AND THROAT WITH OR WITHOUT PRESENCE OF ULCERS  *URINARY PROBLEMS  *BOWEL PROBLEMS  UNUSUAL RASH Items with * indicate a potential emergency and should be followed up as soon as possible.  Feel free to call the clinic you have any questions or concerns. The clinic phone number is [phone].  Please show the CHEMO ALERT CARD at check-in to the Emergency Department and triage nurse.   

## 2015-09-06 NOTE — Progress Notes (Signed)
  Echocardiogram 2D Echocardiogram has been performed.  Darlina Sicilian M 09/06/2015, 3:08 PM

## 2015-09-06 NOTE — Progress Notes (Signed)
[place]  Telephone:([phone] Fax:([phone]) [phone]     ID: Diana Dunn DOB: 05-31-1946  MR#: [id number]  UUV#:[id number]  Patient Care Team: Diana Battiest, MD as PCP - General (Obstetrics and Gynecology) Diana Dunn III, MD as Consulting Physician (General Surgery) Diana Cruel, MD as Consulting Physician (Oncology) Diana Gibson, MD as Attending Physician (Radiation Oncology) Diana Kaufmann, RN as Registered Nurse Diana Germany, RN as Registered Nurse Diana Bouche, NP as Nurse Practitioner (Nurse Practitioner) Diana Kettle, MD (Family Medicine) PCP: Diana Battiest, MD OTHER MD:  CHIEF COMPLAINT: Triple positive breast cancer  CURRENT TREATMENT:  Trastuzumab, tamoxifen  BREAST CANCER HISTORY: From the original intake note:  Diana Dunn had not had a mammogram for approximately 4 years. Sometime mid 2015 she had an area of redness and tenderness in the right breast and she brought this to her physician's attention. She was treated with antibiotics and this completely cleared.Marland Kitchen  Approximately mid April, she again developed redness over the right breast. This was very focal, and it did not look "as red" as the previous time. She brought it to Dr. Harrington Challenger is attention, and was treated with antibiotics. Bilateral mammography with tomosynthesis and right breast ultrasonography was also obtained on 09/19/2014. There was a focal opacity with irregular margins in the upper outer right breast with some central calcifications. There was mild architectural distortion associated with this. On exam Dr. [name] noted a firm palpable tender mass lateral to the right areola with overlying erythema. On ultrasonography there was an irregular hypoechoic mass measuring 2.4 cm.  Repeat right breast ultrasonography after the patient completed her antibiotic course was performed 10/04/2014. On exam there was still a palpable lobulated mass lateral to the right nipple and by ultrasound this  measured 2.6 cm. It was felt to be a possible abscess and a second course of antibiotics, now with Septra, was undertaken. After that treatment, repeat ultrasonography 10/16/2014 continued to show the palpable mass and ultrasonography now found the mass to measure 2.9 cm maximally.  Accordingly biopsy of the mass in question was obtained that same day, 10/16/2014, and showed ([id number]) and invasive ductal carcinoma, grade 2 or 3, estrogen receptor 90% positive, progesterone receptor 80% positive, both with strong staining intensity, with an MIB-1 of 20%, and with HER-2 amplification, the signals ratio being 3.60 and the number per cell 4.50.  Bilateral breast MRI 10/23/2014 showed a breast density to be category B. In the right breast there was an irregular enhancing mass at the 9:30 o'clock position measuring 3.1 cm. There were no additional worrisome masses in the right breast, no findings of concern in the left breast, and no abnormal appearing lymph nodes.  The patient's subsequent history is as detailed below  INTERVAL HISTORY: Diana Dunn returns today for follow-up of her estrogen receptor positive and HER-2 amplified breast cancer. She is due for her next dose of trastuzumab today. Since her last visit she has started on tamoxifen. She tolerates this remarkably well with rare hot flashes and no vaginal changes. The interval history is generally unremarkable.  REVIEW OF SYSTEMS: Diana Dunn has some residual fatigue from radiation, but is slowly working herself out of this hole. She cares for her grandson and this keeps her physically active, plenty. She sleeps well at night. She has continued tenderness to her right breast and is anxious about her pending mammogram. Otherwise she is in no pain. A detailed review of systems is otherwise entirely negative.  PAST MEDICAL HISTORY: Past Medical  History  Diagnosis Date  . Breast cancer of upper-outer quadrant of right female breast ([place]) 10/19/2014  .  Breast cancer ([place])   . History of hiatal hernia   . IBS (irritable bowel syndrome)   . Lactose intolerance   . Cancer [facility])     RIGHT BREAST CANCER  Irritable bowel symptoms  PAST SURGICAL HISTORY: Past Surgical History  Procedure Laterality Date  . Breast surgery      BIOPSY OF BREAST  . Colonoscopy    . Portacath placement Left 11/02/2014    Procedure: INSERTION PORT-A-CATH;  Surgeon: Diana Dunn III, MD;  Location: [facility];  Service: General;  Laterality: Left;  . Radioactive seed guided mastectomy with axillary sentinel lymph node biopsy Right 03/28/2015    Procedure: RADIOACTIVE SEED GUIDED PARTIAL MASTECTOMY WITH AXILLARY SENTINEL LYMPH NODE BIOPSY;  Surgeon: Diana Dunn III, MD;  Location: [place];  Service: General;  Laterality: Right;    FAMILY HISTORY No family history on file. The patient's father died at the age of 47 from a myocardial infarction. The patient's mother died at the age of 56 following a stroke. The patient had 2 brothers. One had Down's syndrome. She had no sisters. The only breast cancer in the family was the patient's father's only sister was diagnosed with breast cancer in her 43s. There is no history of ovarian cancer in the family to the patient's knowledge   GYNECOLOGIC HISTORY:  No LMP recorded. Patient is postmenopausal.  menarche age 39, first live birth age 89, she is [place] P2. She went through the change of life in late 1999. She did not take hormone replacement. She took birth control pills for less than 2 years in her 20N, with no complications   SOCIAL HISTORY:  Diana Dunn is my neighbor. 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 Bill died from widely metastatic malignant melanoma within 4 months of diagnosis some years ago. The patient lives with her son [name] and his wife, who is expecting their first child late November 2016. The other son, [name], lives in [place]. Both sons are appraised  her's. The patient has 2 grandchildren and as just noted one on the way. The patient attends the [employer]: Not in place. At the 10/24/2014 visit the patient was given the appropriate documents 2 complete and notarize at her discretion   HEALTH MAINTENANCE: Social History  Substance Use Topics  . Smoking status: Former Smoker    Types: Cigarettes    Quit date: 08/03/2014  . Smokeless tobacco: Not on file  . Alcohol Use: 3.6 oz/week    6 Glasses of wine per week     Colonoscopy: 2000?  PAP:  Bone density: On wrist, remote   Lipid panel:  Allergies  Allergen Reactions  . Aspirin     Stomach pain  . Aspirin Nausea Only    GI upset/pain    Current Outpatient Prescriptions  Medication Sig Dispense Refill  . cholecalciferol (VITAMIN D) 1000 UNITS tablet Take 1 tablet (1,000 Units total) by mouth daily.    . tamoxifen (NOLVADEX) 20 MG tablet Take 1 tablet (20 mg total) by mouth daily. 30 tablet 3   No current facility-administered medications for this visit.    OBJECTIVE: middle-aged white woman who appears stated age 70 Vitals:   09/06/15 0940  BP: 136/54  Pulse: 57  Temp: 97.9 F (36.6 C)  Resp: 16  Body mass index is 18.31 kg/(m^2).    ECOG FS:0 - Asymptomatic Filed Weights   09/06/15 0940  Weight: 113 lb 6.4 oz (51.438 kg)   baseline weight 122 pounds  Skin: warm, dry  HEENT: sclerae anicteric, conjunctivae pink, oropharynx clear. No thrush or mucositis.  Lymph Nodes: No cervical or supraclavicular lymphadenopathy  Lungs: clear to auscultation bilaterally, no rales, wheezes, or rhonci  Heart: regular rate and rhythm  Abdomen: round, soft, non tender, positive bowel sounds  Musculoskeletal: No focal spinal tenderness, no peripheral edema  Neuro: non focal, well oriented, positive affect  Breasts; deferred  LAB RESULTS:  CMP     Component Value Date/Time   NA 143 09/06/2015 0915   NA 140 11/01/2014 1023   K 4.1  09/06/2015 0915   K 4.2 11/01/2014 1023   CL 104 11/01/2014 1023   CO2 25 09/06/2015 0915   CO2 26 11/01/2014 1023   GLUCOSE 83 09/06/2015 0915   GLUCOSE 93 11/01/2014 1023   BUN 15.5 09/06/2015 0915   BUN 10 11/01/2014 1023   CREATININE 0.9 09/06/2015 0915   CREATININE 0.85 11/01/2014 1023   CALCIUM 8.8 09/06/2015 0915   CALCIUM 9.9 11/01/2014 1023   PROT 6.1* 09/06/2015 0915   ALBUMIN 3.7 09/06/2015 0915   AST 15 09/06/2015 0915   ALT 10 09/06/2015 0915   ALKPHOS 62 09/06/2015 0915   BILITOT 0.48 09/06/2015 0915   GFRNONAA >60 11/01/2014 1023   GFRAA >60 11/01/2014 1023    INo results found for: SPEP, UPEP  Lab Results  Component Value Date   WBC 3.2* 09/06/2015   NEUTROABS 2.0 09/06/2015   HGB 13.8 09/06/2015   HCT 40.8 09/06/2015   MCV 95.9 09/06/2015   PLT 179 09/06/2015      Chemistry      Component Value Date/Time   NA 143 09/06/2015 0915   NA 140 11/01/2014 1023   K 4.1 09/06/2015 0915   K 4.2 11/01/2014 1023   CL 104 11/01/2014 1023   CO2 25 09/06/2015 0915   CO2 26 11/01/2014 1023   BUN 15.5 09/06/2015 0915   BUN 10 11/01/2014 1023   CREATININE 0.9 09/06/2015 0915   CREATININE 0.85 11/01/2014 1023      Component Value Date/Time   CALCIUM 8.8 09/06/2015 0915   CALCIUM 9.9 11/01/2014 1023   ALKPHOS 62 09/06/2015 0915   AST 15 09/06/2015 0915   ALT 10 09/06/2015 0915   BILITOT 0.48 09/06/2015 0915       No results found for: LABCA2  No components found for: LABCA125  No results for input(s): INR in the last 168 hours.  Urinalysis    Component Value Date/Time   LABSPEC 1.020 12/21/2014 1405   PHURINE 5.0 12/21/2014 1405   GLUCOSEU Negative 12/21/2014 1405   HGBUR Negative 12/21/2014 1405   BILIRUBINUR Negative 12/21/2014 1405   KETONESUR Negative 12/21/2014 1405   PROTEINUR < 30 12/21/2014 1405   UROBILINOGEN 0.2 12/21/2014 1405   NITRITE Negative 12/21/2014 1405   LEUKOCYTESUR Small 12/21/2014 1405    STUDIES: No results  found.  ASSESSMENT: 70 y.o. [place] woman status post right breast biopsy 10/16/2014 for a clinical T3 N0, stage IIA invasive ductal carcinoma, grade 2 or 3, estrogen and progesterone receptor positive, with an MIB-1 of 20%, and HER-2 amplified, with a signals ratio of 3.60  (1) chemotherapy and anti-HER-2 immunotherapy started 11/05/2014, consisting of carboplatin, docetaxel, trastuzumab and pertuzumab given every 21 days 6, with Neulasta support-- completed 02/28/2015  (  2) trastuzumab will be continued to complete 1 year (through may 2017)  (a) echocardiogram 05/15/2015 shows an ejection fraction 55-60%  (3) right lumpectomy and sentinel lymph node sampling 03/28/2015 showed a pT1c pN0 residual invasive ductal carcinoma, grade 2, with negative margins.  (4) adjuvant radiation 05/02/2015-06/14/2015  (5)  Tamoxifen started January 2017  PLAN: Bronte looks and feels great today. She is slowly making it back to her baseline. She is tolerating the tamoxifen well and will continue on this daily. She will proceed with her next dose of trastuzumab as planned today. She will continue this every 3 weeks through May of this year.  She is scheduled for a repeat echocardiogram later this afternoon. She will have a bone density scan and mammogram in April.   Calyn will return on 4/13 for follow up with Dr. Jana Hakim. He will go over the results of the tests listed above during that visit. She understands and agrees with this plan. She knows the goal of treatment in her case is cure. She has been encouraged to call with any issues that might arise before her next visit here.    Laurie Panda, NP   09/06/2015 10:16 AM

## 2015-09-20 ENCOUNTER — Ambulatory Visit
Admission: RE | Admit: 2015-09-20 | Discharge: 2015-09-20 | Disposition: A | Discharge status: Home / Self Care | Payer: Medicare Other | Source: Ambulatory Visit | Attending: Oncology | Admitting: Oncology

## 2015-09-20 DIAGNOSIS — C50411 Malignant neoplasm of upper-outer quadrant of right female breast: Secondary | ICD-10-CM

## 2015-09-20 DIAGNOSIS — M810 Age-related osteoporosis without current pathological fracture: Secondary | ICD-10-CM | POA: Diagnosis not present

## 2015-09-20 DIAGNOSIS — R922 Inconclusive mammogram: Secondary | ICD-10-CM | POA: Diagnosis not present

## 2015-09-20 DIAGNOSIS — M8580 Other specified disorders of bone density and structure, unspecified site: Secondary | ICD-10-CM

## 2015-09-20 DIAGNOSIS — B3781 Candidal esophagitis: Secondary | ICD-10-CM

## 2015-09-20 DIAGNOSIS — Z780 Asymptomatic menopausal state: Secondary | ICD-10-CM | POA: Diagnosis not present

## 2015-09-20 DIAGNOSIS — B370 Candidal stomatitis: Secondary | ICD-10-CM

## 2015-09-25 ENCOUNTER — Other Ambulatory Visit: Payer: Self-pay | Admitting: *Deleted

## 2015-09-25 DIAGNOSIS — C50411 Malignant neoplasm of upper-outer quadrant of right female breast: Secondary | ICD-10-CM

## 2015-09-26 ENCOUNTER — Encounter: Payer: Self-pay | Admitting: *Deleted

## 2015-09-26 ENCOUNTER — Ambulatory Visit (HOSPITAL_BASED_OUTPATIENT_CLINIC_OR_DEPARTMENT_OTHER): Payer: Medicare Other | Admitting: Oncology

## 2015-09-26 ENCOUNTER — Telehealth: Payer: Self-pay | Admitting: Oncology

## 2015-09-26 ENCOUNTER — Ambulatory Visit (HOSPITAL_BASED_OUTPATIENT_CLINIC_OR_DEPARTMENT_OTHER): Payer: Medicare Other

## 2015-09-26 ENCOUNTER — Other Ambulatory Visit (HOSPITAL_BASED_OUTPATIENT_CLINIC_OR_DEPARTMENT_OTHER): Payer: Medicare Other

## 2015-09-26 VITALS — BP 133/63 | HR 58 | Temp 97.8°F | Resp 18 | Ht 66.0 in | Wt 113.1 lb

## 2015-09-26 DIAGNOSIS — Z5112 Encounter for antineoplastic immunotherapy: Secondary | ICD-10-CM

## 2015-09-26 DIAGNOSIS — C50411 Malignant neoplasm of upper-outer quadrant of right female breast: Secondary | ICD-10-CM

## 2015-09-26 DIAGNOSIS — M810 Age-related osteoporosis without current pathological fracture: Secondary | ICD-10-CM | POA: Diagnosis not present

## 2015-09-26 DIAGNOSIS — Z170 Estrogen receptor positive status [ER+]: Secondary | ICD-10-CM | POA: Diagnosis not present

## 2015-09-26 LAB — CBC WITH DIFFERENTIAL/PLATELET
BASO%: 0.3 % (ref 0.0–2.0)
Basophils Absolute: 0 10*3/uL (ref 0.0–0.1)
EOS ABS: 0 10*3/uL (ref 0.0–0.5)
EOS%: 1.1 % (ref 0.0–7.0)
HCT: 41.1 % (ref 34.8–46.6)
HEMOGLOBIN: 13.9 g/dL (ref 11.6–15.9)
LYMPH%: 25.8 % (ref 14.0–49.7)
MCH: 32.3 pg (ref 25.1–34.0)
MCHC: 33.8 g/dL (ref 31.5–36.0)
MCV: 95.6 fL (ref 79.5–101.0)
MONO#: 0.3 10*3/uL (ref 0.1–0.9)
MONO%: 7 % (ref 0.0–14.0)
NEUT%: 65.8 % (ref 38.4–76.8)
NEUTROS ABS: 2.4 10*3/uL (ref 1.5–6.5)
Platelets: 176 10*3/uL (ref 145–400)
RBC: 4.3 10*6/uL (ref 3.70–5.45)
RDW: 12.5 % (ref 11.2–14.5)
WBC: 3.6 10*3/uL — AB (ref 3.9–10.3)
lymph#: 0.9 10*3/uL (ref 0.9–3.3)

## 2015-09-26 LAB — COMPREHENSIVE METABOLIC PANEL
ALBUMIN: 3.9 g/dL (ref 3.5–5.0)
ALK PHOS: 64 U/L (ref 40–150)
AST: 14 U/L (ref 5–34)
Anion Gap: 9 mEq/L (ref 3–11)
BILIRUBIN TOTAL: 0.56 mg/dL (ref 0.20–1.20)
BUN: 14.6 mg/dL (ref 7.0–26.0)
CO2: 25 mEq/L (ref 22–29)
CREATININE: 0.9 mg/dL (ref 0.6–1.1)
Calcium: 9.3 mg/dL (ref 8.4–10.4)
Chloride: 107 mEq/L (ref 98–109)
EGFR: 70 mL/min/{1.73_m2} — ABNORMAL LOW (ref >90)
GLUCOSE: 122 mg/dL (ref 70–140)
Potassium: 3.8 mEq/L (ref 3.5–5.1)
SODIUM: 141 meq/L (ref 136–145)
TOTAL PROTEIN: 6.4 g/dL (ref 6.4–8.3)

## 2015-09-26 MED ORDER — DIPHENHYDRAMINE HCL 25 MG PO CAPS: 25.0000 mg | ORAL_CAPSULE | Freq: Once | ORAL | Status: DC

## 2015-09-26 MED ORDER — SODIUM CHLORIDE 0.9 % IV SOLN
Freq: Once | INTRAVENOUS | Status: AC
  Administered 2015-09-26: 12:00:00 via INTRAVENOUS

## 2015-09-26 MED ORDER — TAMOXIFEN CITRATE 20 MG PO TABS: 20.0000 mg | ORAL_TABLET | Freq: Every day | ORAL | Status: DC

## 2015-09-26 MED ORDER — ACETAMINOPHEN 325 MG PO TABS
650.0000 mg | ORAL_TABLET | Freq: Once | ORAL | Status: AC
  Administered 2015-09-26: 650 mg via ORAL

## 2015-09-26 MED ORDER — ACETAMINOPHEN 325 MG PO TABS
ORAL_TABLET | ORAL | Status: AC
  Filled 2015-09-26: qty 2

## 2015-09-26 MED ORDER — TRASTUZUMAB CHEMO INJECTION 440 MG
6.0000 mg/kg | Freq: Once | INTRAVENOUS | Status: AC
  Administered 2015-09-26: 294 mg via INTRAVENOUS
  Filled 2015-09-26: qty 14

## 2015-09-26 NOTE — Patient Instructions (Signed)
[facility] Discharge Instructions for Patients Receiving Chemotherapy  Today you received the following chemotherapy agent: Herceptin   To help prevent nausea and vomiting after your treatment, we encourage you to take your nausea medication as prescribed.    If you develop nausea and vomiting that is not controlled by your nausea medication, call the clinic.   BELOW ARE SYMPTOMS THAT SHOULD BE REPORTED IMMEDIATELY:  *FEVER GREATER THAN 100.5 F  *CHILLS WITH OR WITHOUT FEVER  NAUSEA AND VOMITING THAT IS NOT CONTROLLED WITH YOUR NAUSEA MEDICATION  *UNUSUAL SHORTNESS OF BREATH  *UNUSUAL BRUISING OR BLEEDING  TENDERNESS IN MOUTH AND THROAT WITH OR WITHOUT PRESENCE OF ULCERS  *URINARY PROBLEMS  *BOWEL PROBLEMS  UNUSUAL RASH Items with * indicate a potential emergency and should be followed up as soon as possible.  Feel free to call the clinic you have any questions or concerns. The clinic phone number is [phone].  Please show the CHEMO ALERT CARD at check-in to the Emergency Department and triage nurse.   

## 2015-09-26 NOTE — Telephone Encounter (Signed)
appt made and avs will print in treatment room °

## 2015-09-26 NOTE — Progress Notes (Signed)
[place]  [phone] [phone]     ID: NYJAH SCHWAKE DOB: Dec 11, 1945  MR#: [id number]  HWT#:[id number]  Patient Care Team: Harle Battiest, MD as PCP - General (Obstetrics and Gynecology) Autumn Messing III, MD as Consulting Physician (General Surgery) Chauncey Cruel, MD as Consulting Physician (Oncology) Eppie Gibson, MD as Attending Physician (Radiation Oncology) Mauro Kaufmann, RN as Registered Nurse Rockwell Germany, RN as Registered Nurse Holley Bouche, NP as Nurse Practitioner (Nurse Practitioner) Lona Kettle, MD (Family Medicine) PCP: Harle Battiest, MD OTHER MD:  CHIEF COMPLAINT: Triple positive breast cancer  CURRENT TREATMENT:  Trastuzumab, tamoxifen  BREAST CANCER HISTORY: From the original intake note:  Louellen had not had a mammogram for approximately 4 years. Sometime mid 2015 she had an area of redness and tenderness in the right breast and she brought this to her physician's attention. She was treated with antibiotics and this completely cleared.Marland Kitchen  Approximately mid April, she again developed redness over the right breast. This was very focal, and it did not look "as red" as the previous time. She brought it to Dr. Harrington Challenger is attention, and was treated with antibiotics. Bilateral mammography with tomosynthesis and right breast ultrasonography was also obtained on 09/19/2014. There was a focal opacity with irregular margins in the upper outer right breast with some central calcifications. There was mild architectural distortion associated with this. On exam Dr. [name] noted a firm palpable tender mass lateral to the right areola with overlying erythema. On ultrasonography there was an irregular hypoechoic mass measuring 2.4 cm.  Repeat right breast ultrasonography after the patient completed her antibiotic course was performed 10/04/2014. On exam there was still a palpable lobulated mass lateral to the right nipple and by ultrasound this  measured 2.6 cm. It was felt to be a possible abscess and a second course of antibiotics, now with Septra, was undertaken. After that treatment, repeat ultrasonography 10/16/2014 continued to show the palpable mass and ultrasonography now found the mass to measure 2.9 cm maximally.  Accordingly biopsy of the mass in question was obtained that same day, 10/16/2014, and showed ([id number]) and invasive ductal carcinoma, grade 2 or 3, estrogen receptor 90% positive, progesterone receptor 80% positive, both with strong staining intensity, with an MIB-1 of 20%, and with HER-2 amplification, the signals ratio being 3.60 and the number per cell 4.50.  Bilateral breast MRI 10/23/2014 showed a breast density to be category B. In the right breast there was an irregular enhancing mass at the 9:30 o'clock position measuring 3.1 cm. There were no additional worrisome masses in the right breast, no findings of concern in the left breast, and no abnormal appearing lymph nodes.  The patient's subsequent history is as detailed below  INTERVAL HISTORY: Trudee returns today for follow-up of her triple positive breast cancer. She continues on trastuzumab every 3 weeks. Today is her anti-penultimate treatment. She is tolerating that with no side effects that she is aware of. She just had an echocardiogram which shows an excellent ejection fraction.--She is also on tamoxifen. She has no hot flashes or vaginal wetness or dryness associated with that medication.  REVIEW OF SYSTEMS: Bernarda still has some soreness in the surgical breast. Currently she is not having any "shooting pains". She still gets a little bit tired in the early afternoon, but no longer period she takes walks most days, up to 3 miles at a time. She is having a lot of phone with baby Gwyndolyn Saxon and Thayer Jew the  dog. The baby and the dog get along just fine. Aside from these issues a detailed review of systems today was negative  PAST MEDICAL HISTORY: Past  Medical History  Diagnosis Date  . Breast cancer of upper-outer quadrant of right female breast ([place]) 10/19/2014  . Breast cancer ([place])   . History of hiatal hernia   . IBS (irritable bowel syndrome)   . Lactose intolerance   . Cancer [facility])     RIGHT BREAST CANCER  Irritable bowel symptoms  PAST SURGICAL HISTORY: Past Surgical History  Procedure Laterality Date  . Breast surgery      BIOPSY OF BREAST  . Colonoscopy    . Portacath placement Left 11/02/2014    Procedure: INSERTION PORT-A-CATH;  Surgeon: Autumn Messing III, MD;  Location: [place];  Service: General;  Laterality: Left;  . Radioactive seed guided mastectomy with axillary sentinel lymph node biopsy Right 03/28/2015    Procedure: RADIOACTIVE SEED GUIDED PARTIAL MASTECTOMY WITH AXILLARY SENTINEL LYMPH NODE BIOPSY;  Surgeon: Autumn Messing III, MD;  Location: [place];  Service: General;  Laterality: Right;    FAMILY HISTORY No family history on file. The patient's father died at the age of 9 from a myocardial infarction. The patient's mother died at the age of 1 following a stroke. The patient had 2 brothers. One had Down's syndrome. She had no sisters. The only breast cancer in the family was the patient's father's only sister was diagnosed with breast cancer in her 69s. There is no history of ovarian cancer in the family to the patient's knowledge   GYNECOLOGIC HISTORY:  No LMP recorded. Patient is postmenopausal.  menarche age 89, first live birth age 14, she is [place] P2. She went through the change of life in late 1999. She did not take hormone replacement. She took birth control pills for less than 2 years in her 77L, with no complications   SOCIAL HISTORY:  Ameliya is my neighbor. 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 Bill died from widely metastatic malignant melanoma within 4 months of diagnosis some years ago. The patient lives with her son [name] and his wife, who is  expecting their first child late November 2016. The other son, [name], lives in [place]. Both sons are appraised her's. The patient has 2 grandchildren and as just noted one on the way. The patient attends the [place]: Not in place. At the 10/24/2014 visit the patient was given the appropriate documents 2 complete and notarize at her discretion   HEALTH MAINTENANCE: Social History  Substance Use Topics  . Smoking status: Former Smoker    Types: Cigarettes    Quit date: 08/03/2014  . Smokeless tobacco: Not on file  . Alcohol Use: 3.6 oz/week    6 Glasses of wine per week     Colonoscopy: 2000?  PAP:  Bone density: On wrist, remote   Lipid panel:  Allergies  Allergen Reactions  . Aspirin     Stomach pain  . Aspirin Nausea Only    GI upset/pain    Current Outpatient Prescriptions  Medication Sig Dispense Refill  . cholecalciferol (VITAMIN D) 1000 UNITS tablet Take 1 tablet (1,000 Units total) by mouth daily.    . tamoxifen (NOLVADEX) 20 MG tablet Take 1 tablet (20 mg total) by mouth daily. 30 tablet 3   No current facility-administered medications for this visit.    OBJECTIVE:  middle-aged white woman who appears Well Filed Vitals:   09/26/15 1016  BP: 133/63  Pulse: 58  Temp: 97.8 F (36.6 C)  Resp: 18     Body mass index is 18.26 kg/(m^2).    ECOG FS:0 - Asymptomatic Filed Weights   09/26/15 1016  Weight: 113 lb 1.6 oz (51.302 kg)   baseline weight 122 pounds  Sclerae unicteric, pupils round and equal Oropharynx clear and moist-- no thrush or other lesions No cervical or supraclavicular adenopathy Lungs no rales or rhonchi Heart regular rate and rhythm Abd soft, nontender, positive bowel sounds MSK no focal spinal tenderness, no upper extremity lymphedema Neuro: nonfocal, well oriented, appropriate affect Breasts: Deferred   LAB RESULTS:  CMP     Component Value Date/Time   NA 143 09/06/2015 0915   NA 140  11/01/2014 1023   K 4.1 09/06/2015 0915   K 4.2 11/01/2014 1023   CL 104 11/01/2014 1023   CO2 25 09/06/2015 0915   CO2 26 11/01/2014 1023   GLUCOSE 83 09/06/2015 0915   GLUCOSE 93 11/01/2014 1023   BUN 15.5 09/06/2015 0915   BUN 10 11/01/2014 1023   CREATININE 0.9 09/06/2015 0915   CREATININE 0.85 11/01/2014 1023   CALCIUM 8.8 09/06/2015 0915   CALCIUM 9.9 11/01/2014 1023   PROT 6.1* 09/06/2015 0915   ALBUMIN 3.7 09/06/2015 0915   AST 15 09/06/2015 0915   ALT 10 09/06/2015 0915   ALKPHOS 62 09/06/2015 0915   BILITOT 0.48 09/06/2015 0915   GFRNONAA >60 11/01/2014 1023   GFRAA >60 11/01/2014 1023    INo results found for: SPEP, UPEP  Lab Results  Component Value Date   WBC 3.6* 09/26/2015   NEUTROABS 2.4 09/26/2015   HGB 13.9 09/26/2015   HCT 41.1 09/26/2015   MCV 95.6 09/26/2015   PLT 176 09/26/2015      Chemistry      Component Value Date/Time   NA 143 09/06/2015 0915   NA 140 11/01/2014 1023   K 4.1 09/06/2015 0915   K 4.2 11/01/2014 1023   CL 104 11/01/2014 1023   CO2 25 09/06/2015 0915   CO2 26 11/01/2014 1023   BUN 15.5 09/06/2015 0915   BUN 10 11/01/2014 1023   CREATININE 0.9 09/06/2015 0915   CREATININE 0.85 11/01/2014 1023      Component Value Date/Time   CALCIUM 8.8 09/06/2015 0915   CALCIUM 9.9 11/01/2014 1023   ALKPHOS 62 09/06/2015 0915   AST 15 09/06/2015 0915   ALT 10 09/06/2015 0915   BILITOT 0.48 09/06/2015 0915       No results found for: LABCA2  No components found for: LABCA125  No results for input(s): INR in the last 168 hours.  Urinalysis    Component Value Date/Time   LABSPEC 1.020 12/21/2014 1405   PHURINE 5.0 12/21/2014 1405   GLUCOSEU Negative 12/21/2014 1405   HGBUR Negative 12/21/2014 1405   BILIRUBINUR Negative 12/21/2014 1405   KETONESUR Negative 12/21/2014 1405   PROTEINUR < 30 12/21/2014 1405   UROBILINOGEN 0.2 12/21/2014 1405   NITRITE Negative 12/21/2014 1405   LEUKOCYTESUR Small 12/21/2014 1405     STUDIES: Dg Bone Density  09/20/2015  EXAM: DUAL X-RAY ABSORPTIOMETRY (DXA) FOR BONE MINERAL DENSITY IMPRESSION: Referring Physician:  Chauncey Cruel PATIENT: Name: Alissah, Redmon Patient ID: [id number] Birth Date: 17-Apr-1946 Height: 65.0 in. Sex: Female Measured: 09/20/2015 Weight: 112.0 lbs. Indications: Breast Cancer History, Caucasian, Estrogen Deficient, Low Calcium Intake (269.3), Postmenopausal, Tamoxifen, Vitamin D  Deficient Fractures: None Treatments: Vitamin D (E933.5) ASSESSMENT: The BMD measured at AP Spine L1-L4 is 0.764 g/cm2 with a T-score of -3.5. This patient is considered osteoporotic according to [place] [facility]) criteria. No FRAX performed due to osteoporosis. Site Region Measured Date Measured Age YA BMD Significant CHANGE T-score AP Spine  L1-L4      11/20/[age]    67.7         -3.5    0.764 g/cm2 DualFemur Neck Right 09/20/2015    67.7         -2.3    0.717 g/cm2 World Health Organization [facility]) criteria for post-menopausal, Caucasian Women: Normal       T-score at or above -1 SD Osteopenia   T-score between -1 and -2.5 SD Osteoporosis T-score at or below -2.5 SD RECOMMENDATION: [place] recommends that FDA-approved medical therapies be considered in postmenopausal women and men age 79 or older with a: 1. Hip or vertebral (clinical or morphometric) fracture. 2. T-score of <-2.5 at the spine or hip. 3. Ten-year fracture probability by FRAX of 3% or greater for hip fracture or 20% or greater for major osteoporotic fracture. All treatment decisions require clinical judgment and consideration of individual patient factors, including patient preferences, co-morbidities, previous drug use, risk factors not captured in the FRAX model (e.g. falls, vitamin D deficiency, increased bone turnover, interval significant decline in bone density) and possible under - or over-estimation of fracture risk by FRAX. All patients should ensure an adequate intake  of dietary calcium (1200 mg/d) and vitamin D (800 IU daily) unless contraindicated. FOLLOW-UP: People with diagnosed cases of osteoporosis or at high risk for fracture should have regular bone mineral density tests. For patients eligible for Medicare, routine testing is allowed once every 2 years. The testing frequency can be increased to one year for patients who have rapidly progressing disease, those who are receiving or discontinuing medical therapy to restore bone mass, or have additional risk factors. I have reviewed this report, and agree with the above findings. [facility] Radiology Electronically Signed   By: Lowella Grip III M.D.   On: 09/20/2015 11:12   Mm Diag Breast Tomo Bilateral  09/20/2015  CLINICAL DATA:  History of right breast cancer in 2016 status post breast conservation therapy. EXAM: 2D DIGITAL DIAGNOSTIC BILATERAL MAMMOGRAM WITH CAD AND ADJUNCT TOMO COMPARISON:  Previous exam(s). ACR Breast Density Category c: The breast tissue is heterogeneously dense, which may obscure small masses. FINDINGS: There are expected postsurgical changes within the right breast. There are no new dominant masses, suspicious calcifications or secondary signs of malignancy within either breast. Mammographic images were processed with CAD. IMPRESSION: No evidence of malignancy within either breast. Expected postsurgical changes in the right breast. RECOMMENDATION: Bilateral diagnostic mammogram in 1 year. I have discussed the findings and recommendations with the patient. Results were also provided in writing at the conclusion of the visit. If applicable, a reminder letter will be sent to the patient regarding the next appointment. BI-RADS CATEGORY  2: Benign. Electronically Signed   By: Franki Cabot M.D.   On: 09/20/2015 11:46    ASSESSMENT: 70 y.o. [place] woman status post right breast biopsy 10/16/2014 for a clinical T3 N0, stage IIA invasive ductal carcinoma, grade 2 or 3, estrogen and progesterone  receptor positive, with an MIB-1 of 20%, and HER-2 amplified, with a signals ratio of 3.60  (1) chemotherapy and anti-HER-2 immunotherapy started 11/05/2014, consisting of carboplatin, docetaxel, trastuzumab and pertuzumab given every 21 days  6, with Neulasta support-- completed 02/28/2015  (2) trastuzumab will be continued to complete 1 year (through may 2017)  (a) echocardiogram 09/06/2015 shows an ejection fraction   (3)  of 60-65%right lumpectomy and sentinel lymph node sampling 03/28/2015 showed a pT1c pN0 residual invasive ductal carcinoma, grade 2, with negative margins.  (4) adjuvant radiation 05/02/2015-06/14/2015  (5)  Tamoxifen started January 2017  (6) osteoporosis with T score -3.5 on bone density scan 09/20/2015  PLAN: Meriel is doing terrific with her trastuzumab treatments, and her echo actually shows an apparent improvement in her ejection fraction. She has 3 treatments to go counting today's and will finish at the end of May.  Her mammogram was favorable. Her bone density does show significant osteoporosis. She may benefit from denosumab/Prolia and we discussed that today. She understands that it can cause bony aches and pains for few days, can lower her serum calcium acutely, and very rarely can cause osteonecrosis of the jaw. She tells me her teeth are in perfect condition. She is going to read up on it and do a little research and if she agrees to receive and she will let us know.  Also I gave her information on the [facility] program. She would be terrific for that.  She will see her primary care physician next month and Dr. [name] in June. She will return to see me in September. She knows to call for any problems that may develop before then.      Chauncey Cruel, MD   09/26/2015 10:38 AM

## 2015-10-17 ENCOUNTER — Other Ambulatory Visit: Payer: Self-pay | Admitting: *Deleted

## 2015-10-17 DIAGNOSIS — C50411 Malignant neoplasm of upper-outer quadrant of right female breast: Secondary | ICD-10-CM

## 2015-10-18 ENCOUNTER — Ambulatory Visit (HOSPITAL_BASED_OUTPATIENT_CLINIC_OR_DEPARTMENT_OTHER): Payer: Medicare Other

## 2015-10-18 ENCOUNTER — Other Ambulatory Visit (HOSPITAL_BASED_OUTPATIENT_CLINIC_OR_DEPARTMENT_OTHER): Payer: Medicare Other

## 2015-10-18 VITALS — BP 138/67 | HR 56 | Temp 98.1°F | Resp 18

## 2015-10-18 DIAGNOSIS — Z5112 Encounter for antineoplastic immunotherapy: Secondary | ICD-10-CM

## 2015-10-18 DIAGNOSIS — C50411 Malignant neoplasm of upper-outer quadrant of right female breast: Secondary | ICD-10-CM | POA: Diagnosis present

## 2015-10-18 LAB — CBC WITH DIFFERENTIAL/PLATELET
BASO%: 0.4 % (ref 0.0–2.0)
BASOS ABS: 0 10*3/uL (ref 0.0–0.1)
EOS%: 2.3 % (ref 0.0–7.0)
Eosinophils Absolute: 0.1 10*3/uL (ref 0.0–0.5)
HEMATOCRIT: 42.5 % (ref 34.8–46.6)
HGB: 13.9 g/dL (ref 11.6–15.9)
LYMPH#: 1 10*3/uL (ref 0.9–3.3)
LYMPH%: 24.3 % (ref 14.0–49.7)
MCH: 31.4 pg (ref 25.1–34.0)
MCHC: 32.8 g/dL (ref 31.5–36.0)
MCV: 95.9 fL (ref 79.5–101.0)
MONO#: 0.3 10*3/uL (ref 0.1–0.9)
MONO%: 7.7 % (ref 0.0–14.0)
NEUT#: 2.7 10*3/uL (ref 1.5–6.5)
NEUT%: 65.3 % (ref 38.4–76.8)
PLATELETS: 190 10*3/uL (ref 145–400)
RBC: 4.44 10*6/uL (ref 3.70–5.45)
RDW: 12.3 % (ref 11.2–14.5)
WBC: 4.1 10*3/uL (ref 3.9–10.3)

## 2015-10-18 LAB — COMPREHENSIVE METABOLIC PANEL
ALK PHOS: 61 U/L (ref 40–150)
ALT: 10 U/L (ref 0–55)
ANION GAP: 7 meq/L (ref 3–11)
AST: 13 U/L (ref 5–34)
Albumin: 3.8 g/dL (ref 3.5–5.0)
BILIRUBIN TOTAL: 0.46 mg/dL (ref 0.20–1.20)
BUN: 12.8 mg/dL (ref 7.0–26.0)
CALCIUM: 9.1 mg/dL (ref 8.4–10.4)
CHLORIDE: 109 meq/L (ref 98–109)
CO2: 25 mEq/L (ref 22–29)
CREATININE: 0.9 mg/dL (ref 0.6–1.1)
EGFR: 71 mL/min/{1.73_m2} — ABNORMAL LOW (ref >90)
Glucose: 108 mg/dl (ref 70–140)
Potassium: 4.2 mEq/L (ref 3.5–5.1)
Sodium: 141 mEq/L (ref 136–145)
Total Protein: 6.1 g/dL — ABNORMAL LOW (ref 6.4–8.3)

## 2015-10-18 MED ORDER — DIPHENHYDRAMINE HCL 25 MG PO CAPS: 25.0000 mg | ORAL_CAPSULE | Freq: Once | ORAL | Status: DC

## 2015-10-18 MED ORDER — SODIUM CHLORIDE 0.9 % IV SOLN
Freq: Once | INTRAVENOUS | Status: AC
  Administered 2015-10-18: 11:00:00 via INTRAVENOUS

## 2015-10-18 MED ORDER — ACETAMINOPHEN 325 MG PO TABS
650.0000 mg | ORAL_TABLET | Freq: Once | ORAL | Status: AC
  Administered 2015-10-18: 650 mg via ORAL

## 2015-10-18 MED ORDER — TRASTUZUMAB CHEMO INJECTION 440 MG
6.0000 mg/kg | Freq: Once | INTRAVENOUS | Status: AC
  Administered 2015-10-18: 294 mg via INTRAVENOUS
  Filled 2015-10-18: qty 14

## 2015-10-18 MED ORDER — ACETAMINOPHEN 325 MG PO TABS
ORAL_TABLET | ORAL | Status: AC
  Filled 2015-10-18: qty 2

## 2015-10-18 NOTE — Patient Instructions (Signed)
[facility] Discharge Instructions for Patients Receiving Chemotherapy  Today you received the following chemotherapy agents:  Herceptin  To help prevent nausea and vomiting after your treatment, we encourage you to take your nausea medication as prescribed.   If you develop nausea and vomiting that is not controlled by your nausea medication, call the clinic.   BELOW ARE SYMPTOMS THAT SHOULD BE REPORTED IMMEDIATELY:  *FEVER GREATER THAN 100.5 F  *CHILLS WITH OR WITHOUT FEVER  NAUSEA AND VOMITING THAT IS NOT CONTROLLED WITH YOUR NAUSEA MEDICATION  *UNUSUAL SHORTNESS OF BREATH  *UNUSUAL BRUISING OR BLEEDING  TENDERNESS IN MOUTH AND THROAT WITH OR WITHOUT PRESENCE OF ULCERS  *URINARY PROBLEMS  *BOWEL PROBLEMS  UNUSUAL RASH Items with * indicate a potential emergency and should be followed up as soon as possible.  Feel free to call the clinic you have any questions or concerns. The clinic phone number is [phone].  Please show the CHEMO ALERT CARD at check-in to the Emergency Department and triage nurse.   

## 2015-11-07 ENCOUNTER — Other Ambulatory Visit: Payer: Self-pay | Admitting: Oncology

## 2015-11-07 ENCOUNTER — Other Ambulatory Visit: Payer: Self-pay

## 2015-11-07 ENCOUNTER — Other Ambulatory Visit: Payer: Self-pay | Admitting: Nurse Practitioner

## 2015-11-07 DIAGNOSIS — C50411 Malignant neoplasm of upper-outer quadrant of right female breast: Secondary | ICD-10-CM

## 2015-11-08 ENCOUNTER — Telehealth: Payer: Self-pay | Admitting: Oncology

## 2015-11-08 ENCOUNTER — Ambulatory Visit (HOSPITAL_BASED_OUTPATIENT_CLINIC_OR_DEPARTMENT_OTHER): Payer: Medicare Other

## 2015-11-08 ENCOUNTER — Other Ambulatory Visit (HOSPITAL_BASED_OUTPATIENT_CLINIC_OR_DEPARTMENT_OTHER): Payer: Medicare Other

## 2015-11-08 ENCOUNTER — Encounter: Payer: Self-pay | Admitting: *Deleted

## 2015-11-08 VITALS — BP 133/57 | HR 57 | Temp 98.2°F | Resp 18

## 2015-11-08 DIAGNOSIS — Z5112 Encounter for antineoplastic immunotherapy: Secondary | ICD-10-CM | POA: Diagnosis present

## 2015-11-08 DIAGNOSIS — C50411 Malignant neoplasm of upper-outer quadrant of right female breast: Secondary | ICD-10-CM | POA: Diagnosis present

## 2015-11-08 LAB — CBC WITH DIFFERENTIAL/PLATELET
BASO%: 0.5 % (ref 0.0–2.0)
Basophils Absolute: 0 10*3/uL (ref 0.0–0.1)
EOS%: 1.5 % (ref 0.0–7.0)
Eosinophils Absolute: 0.1 10*3/uL (ref 0.0–0.5)
HEMATOCRIT: 43.4 % (ref 34.8–46.6)
HEMOGLOBIN: 14.6 g/dL (ref 11.6–15.9)
LYMPH#: 1 10*3/uL (ref 0.9–3.3)
LYMPH%: 18.2 % (ref 14.0–49.7)
MCH: 32 pg (ref 25.1–34.0)
MCHC: 33.6 g/dL (ref 31.5–36.0)
MCV: 95.3 fL (ref 79.5–101.0)
MONO#: 0.4 10*3/uL (ref 0.1–0.9)
MONO%: 7.2 % (ref 0.0–14.0)
NEUT#: 4.1 10*3/uL (ref 1.5–6.5)
NEUT%: 72.6 % (ref 38.4–76.8)
PLATELETS: 200 10*3/uL (ref 145–400)
RBC: 4.56 10*6/uL (ref 3.70–5.45)
RDW: 12.7 % (ref 11.2–14.5)
WBC: 5.6 10*3/uL (ref 3.9–10.3)

## 2015-11-08 LAB — COMPREHENSIVE METABOLIC PANEL
ALBUMIN: 4 g/dL (ref 3.5–5.0)
ALK PHOS: 71 U/L (ref 40–150)
AST: 12 U/L (ref 5–34)
Anion Gap: 9 mEq/L (ref 3–11)
BILIRUBIN TOTAL: 0.43 mg/dL (ref 0.20–1.20)
BUN: 13.4 mg/dL (ref 7.0–26.0)
CALCIUM: 9.1 mg/dL (ref 8.4–10.4)
CO2: 23 mEq/L (ref 22–29)
Chloride: 107 mEq/L (ref 98–109)
Creatinine: 0.9 mg/dL (ref 0.6–1.1)
EGFR: 70 mL/min/{1.73_m2} — ABNORMAL LOW (ref >90)
GLUCOSE: 117 mg/dL (ref 70–140)
POTASSIUM: 4.3 meq/L (ref 3.5–5.1)
Sodium: 139 mEq/L (ref 136–145)
TOTAL PROTEIN: 6.7 g/dL (ref 6.4–8.3)

## 2015-11-08 MED ORDER — ACETAMINOPHEN 325 MG PO TABS
ORAL_TABLET | ORAL | Status: AC
  Filled 2015-11-08: qty 2

## 2015-11-08 MED ORDER — SODIUM CHLORIDE 0.9 % IV SOLN
Freq: Once | INTRAVENOUS | Status: AC
  Administered 2015-11-08: 11:00:00 via INTRAVENOUS

## 2015-11-08 MED ORDER — TRASTUZUMAB CHEMO INJECTION 440 MG
6.0000 mg/kg | Freq: Once | INTRAVENOUS | Status: AC
  Administered 2015-11-08: 294 mg via INTRAVENOUS
  Filled 2015-11-08: qty 14

## 2015-11-08 MED ORDER — ACETAMINOPHEN 325 MG PO TABS
650.0000 mg | ORAL_TABLET | Freq: Once | ORAL | Status: AC
  Administered 2015-11-08: 650 mg via ORAL

## 2015-11-08 NOTE — Patient Instructions (Signed)
Trastuzumab injection for infusion What is this medicine? TRASTUZUMAB (tras TOO zoo mab) is a monoclonal antibody. It is used to treat breast cancer and stomach cancer. This medicine may be used for other purposes; ask your health care provider or pharmacist if you have questions. What should I tell my health care provider before I take this medicine? They need to know if you have any of these conditions: -heart disease -heart failure -infection (especially a virus infection such as chickenpox, cold sores, or herpes) -lung or breathing disease, like asthma -recent or ongoing radiation therapy -an unusual or allergic reaction to trastuzumab, benzyl alcohol, or other medications, foods, dyes, or preservatives -pregnant or trying to get pregnant -breast-feeding How should I use this medicine? This drug is given as an infusion into a vein. It is administered in a hospital or clinic by a specially trained health care professional. Talk to your pediatrician regarding the use of this medicine in children. This medicine is not approved for use in children. Overdosage: If you think you have taken too much of this medicine contact a poison control center or emergency room at once. NOTE: This medicine is only for you. Do not share this medicine with others. What if I miss a dose? It is important not to miss a dose. Call your doctor or health care professional if you are unable to keep an appointment. What may interact with this medicine? -doxorubicin -warfarin This list may not describe all possible interactions. Give your health care provider a list of all the medicines, herbs, non-prescription drugs, or dietary supplements you use. Also tell them if you smoke, drink alcohol, or use illegal drugs. Some items may interact with your medicine. What should I watch for while using this medicine? Visit your doctor for checks on your progress. Report any side effects. Continue your course of treatment even  though you feel ill unless your doctor tells you to stop. Call your doctor or health care professional for advice if you get a fever, chills or sore throat, or other symptoms of a cold or flu. Do not treat yourself. Try to avoid being around people who are sick. You may experience fever, chills and shaking during your first infusion. These effects are usually mild and can be treated with other medicines. Report any side effects during the infusion to your health care professional. Fever and chills usually do not happen with later infusions. Do not become pregnant while taking this medicine or for 7 months after stopping it. Women should inform their doctor if they wish to become pregnant or think they might be pregnant. Women of child-bearing potential will need to have a negative pregnancy test before starting this medicine. There is a potential for serious side effects to an unborn child. Talk to your health care professional or pharmacist for more information. Do not breast-feed an infant while taking this medicine or for 7 months after stopping it. Women must use effective birth control with this medicine. What side effects may I notice from receiving this medicine? Side effects that you should report to your doctor or other health care professional as soon as possible: -breathing difficulties -chest pain or palpitations -cough -dizziness or fainting -fever or chills, sore throat -skin rash, itching or hives -swelling of the legs or ankles -unusually weak or tired Side effects that usually do not require medical attention (report to your doctor or other health care professional if they continue or are bothersome): -loss of appetite -headache -muscle aches -nausea This   list may not describe all possible side effects. Call your doctor for medical advice about side effects. You may report side effects to FDA at 1-800-FDA-1088. Where should I keep my medicine? This drug is given in a hospital  or clinic and will not be stored at home. NOTE: This sheet is a summary. It may not cover all possible information. If you have questions about this medicine, talk to your doctor, pharmacist, or health care provider.    2016, Elsevier/Gold Standard. (2014-09-07 11:49:32)   

## 2015-11-08 NOTE — Telephone Encounter (Signed)
lvm to inform patient of survivorship appt 7/21 at 11 am

## 2015-12-05 DIAGNOSIS — C50411 Malignant neoplasm of upper-outer quadrant of right female breast: Secondary | ICD-10-CM | POA: Diagnosis not present

## 2015-12-17 ENCOUNTER — Other Ambulatory Visit: Payer: Self-pay | Admitting: Nurse Practitioner

## 2015-12-27 DIAGNOSIS — Z136 Encounter for screening for cardiovascular disorders: Secondary | ICD-10-CM | POA: Diagnosis not present

## 2015-12-27 DIAGNOSIS — Z0000 Encounter for general adult medical examination without abnormal findings: Secondary | ICD-10-CM | POA: Diagnosis not present

## 2015-12-27 DIAGNOSIS — Z131 Encounter for screening for diabetes mellitus: Secondary | ICD-10-CM | POA: Diagnosis not present

## 2016-01-03 ENCOUNTER — Encounter: Payer: Medicare Other | Admitting: Nurse Practitioner

## 2016-02-05 NOTE — Telephone Encounter (Signed)
error 

## 2016-02-21 ENCOUNTER — Telehealth: Payer: Self-pay | Admitting: Oncology

## 2016-02-21 ENCOUNTER — Other Ambulatory Visit: Payer: Self-pay

## 2016-02-21 DIAGNOSIS — C50411 Malignant neoplasm of upper-outer quadrant of right female breast: Secondary | ICD-10-CM

## 2016-02-21 NOTE — Telephone Encounter (Signed)
02/24/2016 and 03/02/2016 Appointments rescheduled to 03/19/2016 per patient request. Per patient, would be out of town the week of 03/02/2016.

## 2016-02-24 ENCOUNTER — Other Ambulatory Visit: Payer: Medicare Other

## 2016-03-02 ENCOUNTER — Other Ambulatory Visit: Payer: Medicare Other

## 2016-03-02 ENCOUNTER — Ambulatory Visit: Payer: Medicare Other | Admitting: Oncology

## 2016-03-13 DIAGNOSIS — H6120 Impacted cerumen, unspecified ear: Secondary | ICD-10-CM | POA: Diagnosis not present

## 2016-03-19 ENCOUNTER — Other Ambulatory Visit (HOSPITAL_BASED_OUTPATIENT_CLINIC_OR_DEPARTMENT_OTHER): Payer: Medicare Other

## 2016-03-19 ENCOUNTER — Ambulatory Visit (HOSPITAL_BASED_OUTPATIENT_CLINIC_OR_DEPARTMENT_OTHER): Payer: Medicare Other | Admitting: Oncology

## 2016-03-19 VITALS — BP 154/79 | HR 59 | Temp 97.9°F | Resp 18 | Ht 66.0 in | Wt 115.6 lb

## 2016-03-19 DIAGNOSIS — M810 Age-related osteoporosis without current pathological fracture: Secondary | ICD-10-CM | POA: Diagnosis not present

## 2016-03-19 DIAGNOSIS — Z79810 Long term (current) use of selective estrogen receptor modulators (SERMs): Secondary | ICD-10-CM

## 2016-03-19 DIAGNOSIS — C50411 Malignant neoplasm of upper-outer quadrant of right female breast: Secondary | ICD-10-CM

## 2016-03-19 DIAGNOSIS — Z170 Estrogen receptor positive status [ER+]: Secondary | ICD-10-CM

## 2016-03-19 LAB — COMPREHENSIVE METABOLIC PANEL
ALT: 10 U/L (ref 0–55)
ANION GAP: 8 meq/L (ref 3–11)
AST: 16 U/L (ref 5–34)
Albumin: 3.9 g/dL (ref 3.5–5.0)
Alkaline Phosphatase: 60 U/L (ref 40–150)
BILIRUBIN TOTAL: 0.54 mg/dL (ref 0.20–1.20)
BUN: 15 mg/dL (ref 7.0–26.0)
CO2: 27 meq/L (ref 22–29)
Calcium: 9.3 mg/dL (ref 8.4–10.4)
Chloride: 106 mEq/L (ref 98–109)
Creatinine: 0.8 mg/dL (ref 0.6–1.1)
EGFR: 71 mL/min/{1.73_m2} — AB (ref >90)
Glucose: 138 mg/dl (ref 70–140)
Potassium: 4 mEq/L (ref 3.5–5.1)
Sodium: 141 mEq/L (ref 136–145)
TOTAL PROTEIN: 6.4 g/dL (ref 6.4–8.3)

## 2016-03-19 LAB — CBC WITH DIFFERENTIAL/PLATELET
BASO%: 0.2 % (ref 0.0–2.0)
Basophils Absolute: 0 10*3/uL (ref 0.0–0.1)
EOS ABS: 0 10*3/uL (ref 0.0–0.5)
EOS%: 0.6 % (ref 0.0–7.0)
HCT: 42.9 % (ref 34.8–46.6)
HGB: 14.8 g/dL (ref 11.6–15.9)
LYMPH%: 28.4 % (ref 14.0–49.7)
MCH: 32.8 pg (ref 25.1–34.0)
MCHC: 34.5 g/dL (ref 31.5–36.0)
MCV: 95.1 fL (ref 79.5–101.0)
MONO#: 0.3 10*3/uL (ref 0.1–0.9)
MONO%: 6.8 % (ref 0.0–14.0)
NEUT%: 64 % (ref 38.4–76.8)
NEUTROS ABS: 3.2 10*3/uL (ref 1.5–6.5)
PLATELETS: 198 10*3/uL (ref 145–400)
RBC: 4.51 10*6/uL (ref 3.70–5.45)
RDW: 12.4 % (ref 11.2–14.5)
WBC: 5 10*3/uL (ref 3.9–10.3)
lymph#: 1.4 10*3/uL (ref 0.9–3.3)

## 2016-03-19 NOTE — Progress Notes (Signed)
[place]  [phone] [phone]     ID: AYDEN APODACA DOB: 1945-11-14  MR#: [id number]  JJH#:[id number]  Patient Care Team: Harle Battiest, MD as PCP - General (Obstetrics and Gynecology) Autumn Messing III, MD as Consulting Physician (General Surgery) Chauncey Cruel, MD as Consulting Physician (Oncology) Eppie Gibson, MD as Attending Physician (Radiation Oncology) Mauro Kaufmann, RN as Registered Nurse Rockwell Germany, RN as Registered Nurse Holley Bouche, NP as Nurse Practitioner (Nurse Practitioner) Lona Kettle, MD (Family Medicine) PCP: Harle Battiest, MD OTHER MD:  CHIEF COMPLAINT: Triple positive breast cancer  CURRENT TREATMENT:  tamoxifen  BREAST CANCER HISTORY: From the original intake note:  Tommy had not had a mammogram for approximately 4 years. Sometime mid 2015 she had an area of redness and tenderness in the right breast and she brought this to her physician's attention. She was treated with antibiotics and this completely cleared.Marland Kitchen  Approximately mid April, she again developed redness over the right breast. This was very focal, and it did not look "as red" as the previous time. She brought it to Dr. Harrington Challenger is attention, and was treated with antibiotics. Bilateral mammography with tomosynthesis and right breast ultrasonography was also obtained on 09/19/2014. There was a focal opacity with irregular margins in the upper outer right breast with some central calcifications. There was mild architectural distortion associated with this. On exam Dr. [name] noted a firm palpable tender mass lateral to the right areola with overlying erythema. On ultrasonography there was an irregular hypoechoic mass measuring 2.4 cm.  Repeat right breast ultrasonography after the patient completed her antibiotic course was performed 10/04/2014. On exam there was still a palpable lobulated mass lateral to the right nipple and by ultrasound this measured 2.6  cm. It was felt to be a possible abscess and a second course of antibiotics, now with Septra, was undertaken. After that treatment, repeat ultrasonography 10/16/2014 continued to show the palpable mass and ultrasonography now found the mass to measure 2.9 cm maximally.  Accordingly biopsy of the mass in question was obtained that same day, 10/16/2014, and showed ([id number]) and invasive ductal carcinoma, grade 2 or 3, estrogen receptor 90% positive, progesterone receptor 80% positive, both with strong staining intensity, with an MIB-1 of 20%, and with HER-2 amplification, the signals ratio being 3.60 and the number per cell 4.50.  Bilateral breast MRI 10/23/2014 showed a breast density to be category B. In the right breast there was an irregular enhancing mass at the 9:30 o'clock position measuring 3.1 cm. There were no additional worrisome masses in the right breast, no findings of concern in the left breast, and no abnormal appearing lymph nodes.  The patient's subsequent history is as detailed below  INTERVAL HISTORY: Yvette returns today for follow-up of her estrogen receptor positive breast cancer. She takes tamoxifen every day around 2 PM. She gets a little feeling of warmth, but no real hot flashes. She has no vaginal wetness side effects from this. She obtains it at a moderately good price.  REVIEW OF SYSTEMS: Brookelynn's hair has come back early and brushy. She has plenty of energy and exercises chiefly by walking. Of course she is very busy with her grandson Gwyndolyn Saxon and the family dog Thayer Jew. She walks with 2 friends almost every day, for about an hour. She has some discomfort in her right breast at times, but this is on and off and minimal. A detailed review of systems today was benign  PAST MEDICAL  HISTORY: Past Medical History:  Diagnosis Date  . Breast cancer ([facility])   . Breast cancer of upper-outer quadrant of right female breast ([facility]) 10/19/2014  . Cancer ([place])    RIGHT BREAST CANCER   . History of hiatal hernia   . IBS (irritable bowel syndrome)   . Lactose intolerance   Irritable bowel symptoms  PAST SURGICAL HISTORY: Past Surgical History:  Procedure Laterality Date  . BREAST SURGERY     BIOPSY OF BREAST  . COLONOSCOPY    . PORTACATH PLACEMENT Left 11/02/2014   Procedure: INSERTION PORT-A-CATH;  Surgeon: Autumn Messing III, MD;  Location: [facility];  Service: General;  Laterality: Left;  . RADIOACTIVE SEED GUIDED MASTECTOMY WITH AXILLARY SENTINEL LYMPH NODE BIOPSY Right 03/28/2015   Procedure: RADIOACTIVE SEED GUIDED PARTIAL MASTECTOMY WITH AXILLARY SENTINEL LYMPH NODE BIOPSY;  Surgeon: Autumn Messing III, MD;  Location: [facility];  Service: General;  Laterality: Right;    FAMILY HISTORY No family history on file. The patient's father died at the age of 67 from a myocardial infarction. The patient's mother died at the age of 69 following a stroke. The patient had 2 brothers. One had Down's syndrome. She had no sisters. The only breast cancer in the family was the patient's father's only sister was diagnosed with breast cancer in her 68s. There is no history of ovarian cancer in the family to the patient's knowledge   GYNECOLOGIC HISTORY:  No LMP recorded. Patient is postmenopausal.  menarche age 20, first live birth age 29, she is [place] P2. She went through the change of life in late 1999. She did not take hormone replacement. She took birth control pills for less than 2 years in her 57V, with no complications   SOCIAL HISTORY:  [place] is my neighbor. 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 Bill died from widely metastatic malignant melanoma within 4 months of diagnosis some years ago. The patient lives with her son [name] and his wife, who is expecting their first child late November 2016. The other son, [name], lives in [place]. Both sons are appraised her's. The patient has 2 grandchildren and as just noted one on the  way. The patient attends the [place]: Not in place. At the 10/24/2014 visit the patient was given the appropriate documents 2 complete and notarize at her discretion   HEALTH MAINTENANCE: Social History  Substance Use Topics  . Smoking status: Former Smoker    Types: Cigarettes    Quit date: 08/03/2014  . Smokeless tobacco: Not on file  . Alcohol use 3.6 oz/week    6 Glasses of wine per week     Colonoscopy: 2000?  PAP:  Bone density: On wrist, remote   Lipid panel:  Allergies  Allergen Reactions  . Aspirin     Stomach pain  . Aspirin Nausea Only    GI upset/pain    Current Outpatient Prescriptions  Medication Sig Dispense Refill  . cholecalciferol (VITAMIN D) 1000 UNITS tablet Take 1 tablet (1,000 Units total) by mouth daily.    . tamoxifen (NOLVADEX) 20 MG tablet Take 1 tablet (20 mg total) by mouth daily. 90 tablet 4   No current facility-administered medications for this visit.     OBJECTIVE: middle-aged white woman In no acute distress Vitals:   03/19/16 1148  BP: (!) 154/79  Pulse: (!) 59  Resp: 18  Temp: 97.9 F (36.6  C)     Body mass index is 18.66 kg/m.    ECOG FS:0 - Asymptomatic Filed Weights   03/19/16 1148  Weight: 115 lb 9.6 oz (52.4 kg)   baseline weight 122 pounds  Sclerae unicteric, EOMs intact Oropharynx clear and moist No cervical or supraclavicular adenopathy Lungs no rales or rhonchi Heart regular rate and rhythm Abd soft, nontender, positive bowel sounds MSK no focal spinal tenderness, no upper extremity lymphedema Neuro: nonfocal, well oriented, appropriate affect Breasts: The right breast is status post lumpectomy and radiation. The cosmetic result is excellent. There is no evidence of disease recurrence. The right axilla is benign. The left breast is unremarkable   LAB RESULTS:  CMP     Component Value Date/Time   NA 141 03/19/2016 1130   K 4.0 03/19/2016 1130   CL 104 11/01/2014 1023    CO2 27 03/19/2016 1130   GLUCOSE 138 03/19/2016 1130   BUN 15.0 03/19/2016 1130   CREATININE 0.8 03/19/2016 1130   CALCIUM 9.3 03/19/2016 1130   PROT 6.4 03/19/2016 1130   ALBUMIN 3.9 03/19/2016 1130   AST 16 03/19/2016 1130   ALT 10 03/19/2016 1130   ALKPHOS 60 03/19/2016 1130   BILITOT 0.54 03/19/2016 1130   GFRNONAA >60 11/01/2014 1023   GFRAA >60 11/01/2014 1023    INo results found for: SPEP, UPEP  Lab Results  Component Value Date   WBC 5.0 03/19/2016   NEUTROABS 3.2 03/19/2016   HGB 14.8 03/19/2016   HCT 42.9 03/19/2016   MCV 95.1 03/19/2016   PLT 198 03/19/2016      Chemistry      Component Value Date/Time   NA 141 03/19/2016 1130   K 4.0 03/19/2016 1130   CL 104 11/01/2014 1023   CO2 27 03/19/2016 1130   BUN 15.0 03/19/2016 1130   CREATININE 0.8 03/19/2016 1130      Component Value Date/Time   CALCIUM 9.3 03/19/2016 1130   ALKPHOS 60 03/19/2016 1130   AST 16 03/19/2016 1130   ALT 10 03/19/2016 1130   BILITOT 0.54 03/19/2016 1130       No results found for: LABCA2  No components found for: LABCA125  No results for input(s): INR in the last 168 hours.  Urinalysis    Component Value Date/Time   LABSPEC 1.020 12/21/2014 1405   PHURINE 5.0 12/21/2014 1405   GLUCOSEU Negative 12/21/2014 1405   HGBUR Negative 12/21/2014 1405   BILIRUBINUR Negative 12/21/2014 1405   KETONESUR Negative 12/21/2014 1405   PROTEINUR < 30 12/21/2014 1405   UROBILINOGEN 0.2 12/21/2014 1405   NITRITE Negative 12/21/2014 1405   LEUKOCYTESUR Small 12/21/2014 1405    STUDIES: CLINICAL DATA:  History of right breast cancer in 2016 status post breast conservation therapy.  EXAM: 2D DIGITAL DIAGNOSTIC BILATERAL MAMMOGRAM WITH CAD AND ADJUNCT TOMO  COMPARISON:  Previous exam(s).  ACR Breast Density Category c: The breast tissue is heterogeneously dense, which may obscure small masses.  FINDINGS: There are expected postsurgical changes within the right  breast. There are no new dominant masses, suspicious calcifications or secondary signs of malignancy within either breast.  Mammographic images were processed with CAD.  IMPRESSION: No evidence of malignancy within either breast. Expected postsurgical changes in the right breast.  RECOMMENDATION: Bilateral diagnostic mammogram in 1 year.  I have discussed the findings and recommendations with the patient. Results were also provided in writing at the conclusion of the visit. If applicable, a reminder letter will be sent to  the patient regarding the next appointment.  BI-RADS CATEGORY  2: Benign.   Electronically Signed   By: Franki Cabot M.D.   On: 09/20/2015 11:46  ASSESSMENT: 70 y.o. [place] woman status post right breastUpper outer quadrant biopsy 10/16/2014 for a clinical T3 N0, stage IIA invasive ductal carcinoma, grade 2 or 3, estrogen and progesterone receptor positive, with an MIB-1 of 20%, and HER-2 amplified, with a signals ratio of 3.60  (1) chemotherapy and anti-HER-2 immunotherapy started 11/05/2014, consisting of carboplatin, docetaxel, trastuzumab and pertuzumab given every 21 days 6, with Neulasta support-- completed 02/28/2015  (2) trastuzumab continued to complete 1 year (last dose 11/08/2015)  (a) echocardiogram 09/06/2015 shows an ejection fraction of 60-65%  (3)  right lumpectomy and sentinel lymph node sampling 03/28/2015 showed a pT1c pN0 residual invasive ductal carcinoma, grade 2, with negative margins.  (4) adjuvant radiation 05/02/2015-06/14/2015  (5)  Tamoxifen started January 2017  (6) osteoporosis with T score -3.5 on bone density scan 09/20/2015  PLAN: Kareemah is now a year out from definitive surgery for her breast cancer with no evidence of disease recurrence. This is very favorable.  She is tolerating tamoxifen well, with no unusual side effects. Given her severe osteoporosis, we are not planning to switch to aromatase  inhibitors. The plan will be to continue tamoxifen for a total of 5 years  She will see Dr. [name] sometime in January. She will see me again in April after her next set of mammography. From that point I will start seeing her on a once a year basis.  She has a good understanding of this plan. She agrees with it. She knows to call for any problems that may develop before the next visit.         Chauncey Cruel, MD   03/19/2016 12:07 PM

## 2016-04-10 DIAGNOSIS — Z23 Encounter for immunization: Secondary | ICD-10-CM | POA: Diagnosis not present

## 2016-04-19 DIAGNOSIS — S81801A Unspecified open wound, right lower leg, initial encounter: Secondary | ICD-10-CM | POA: Diagnosis not present

## 2016-04-21 DIAGNOSIS — S81801A Unspecified open wound, right lower leg, initial encounter: Secondary | ICD-10-CM | POA: Diagnosis not present

## 2016-06-03 IMAGING — MG MM DIGITAL DIAGNOSTIC BILAT
2 series · 2 of 2 positions shown · non-contrast
Comparison: None.

CLINICAL DATA: Patient has right breast swelling, redness and
tenderness. There is a palpable lump along the lateral periareolar
region of the right breast. Symptoms are similar to symptoms this
patient had 1 year previously, diagnosed as an infection and
resolved with antibiotics. Patient has not had a previous mammogram
for 13 years. Patient was just put on antibiotic treatment.

EXAM:
DIGITAL DIAGNOSTIC BILATERAL MAMMOGRAM WITH 3D TOMOSYNTHESIS WITH
CAD
ULTRASOUND RIGHT BREAST

[L CC]
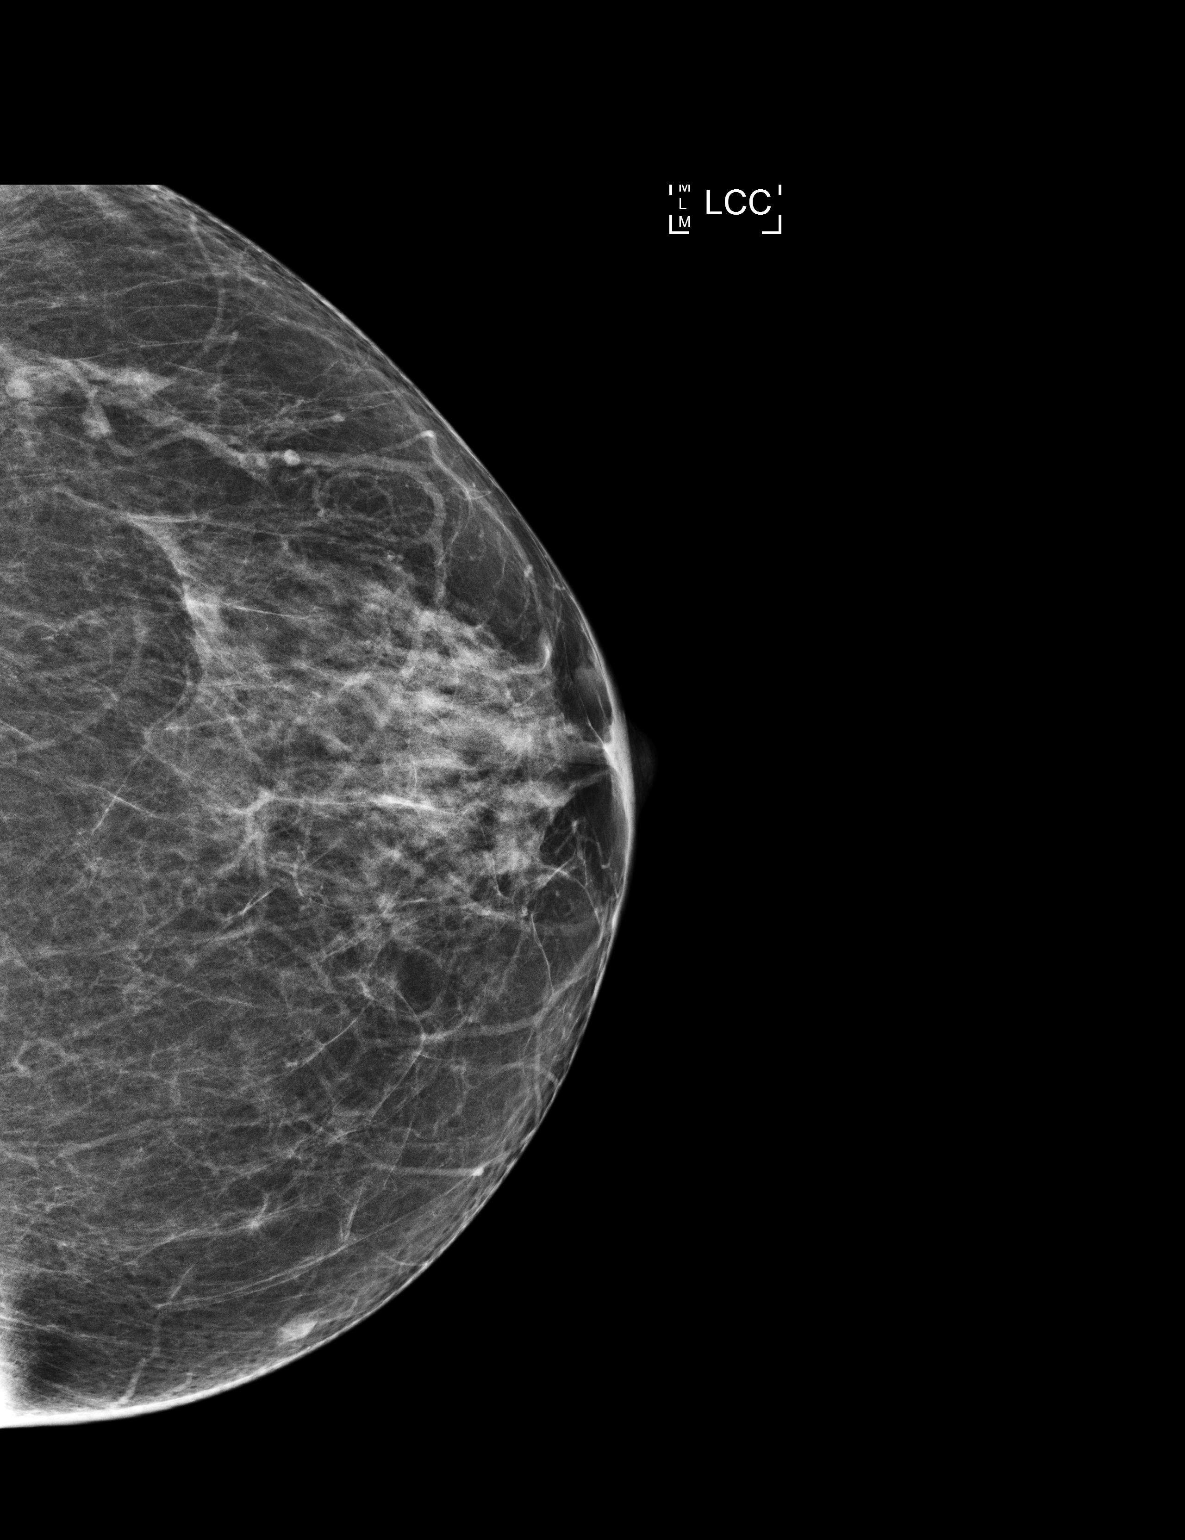

[L CC tomo]
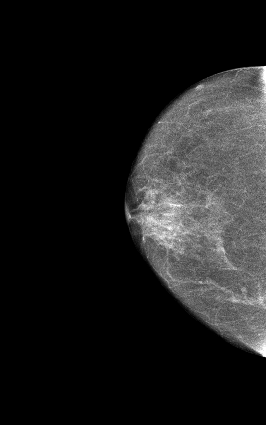

[2 of 2 positions shown; findings below may reference images not displayed]

ACR Breast Density Category c: The breast tissue is heterogeneously
dense, which may obscure small masses.
FINDINGS: There is focal opacity with irregular margins in the upper outer
right breast with subtle central calcifications. Mild architectural
distortion is associated with this. There are no other focal
opacities or areas of distortion. No other significant findings.

Mammographic images were processed with CAD.

On physical exam, there is a firm palpable tender mass lateral to
the right areola with overlying redness/erythema.

Targeted ultrasound is performed, showing an irregular hypoechoic
mass with irregular margins in the right breast at 9 o'clock, 2 cm
the nipple. This measures 2.4 cm x 1.4 cm x 2.3 cm.
IMPRESSION: 1. Probable abscess or focal inflammatory process in the upper outer
right breast face primarily on the patient's history. Short-term
follow-up ultrasound recommended after patient completes her
antibiotic therapy.

RECOMMENDATION:
Repeat right breast ultrasound in 2 weeks following completion of
the antibiotic therapy.

I have discussed the findings and recommendations with the patient.
Results were also provided in writing at the conclusion of the
visit. If applicable, a reminder letter will be sent to the patient
regarding the next appointment.

BI-RADS CATEGORY  3: Probably benign.

## 2016-08-10 ENCOUNTER — Other Ambulatory Visit: Payer: Self-pay | Admitting: Oncology

## 2016-08-10 DIAGNOSIS — Z853 Personal history of malignant neoplasm of breast: Secondary | ICD-10-CM

## 2016-09-25 ENCOUNTER — Ambulatory Visit
Admission: RE | Admit: 2016-09-25 | Discharge: 2016-09-25 | Disposition: A | Discharge status: Home / Self Care | Payer: Medicare Other | Source: Ambulatory Visit | Attending: Oncology | Admitting: Oncology

## 2016-09-25 DIAGNOSIS — R928 Other abnormal and inconclusive findings on diagnostic imaging of breast: Secondary | ICD-10-CM | POA: Diagnosis not present

## 2016-09-25 DIAGNOSIS — Z853 Personal history of malignant neoplasm of breast: Secondary | ICD-10-CM

## 2016-09-25 HISTORY — DX: Personal history of irradiation: Z92.3

## 2016-09-25 HISTORY — DX: Personal history of antineoplastic chemotherapy: Z92.21

## 2016-09-26 ENCOUNTER — Other Ambulatory Visit: Payer: Self-pay | Admitting: Oncology

## 2016-10-08 ENCOUNTER — Other Ambulatory Visit (HOSPITAL_BASED_OUTPATIENT_CLINIC_OR_DEPARTMENT_OTHER): Payer: Medicare Other

## 2016-10-08 ENCOUNTER — Ambulatory Visit (HOSPITAL_BASED_OUTPATIENT_CLINIC_OR_DEPARTMENT_OTHER): Payer: Medicare Other | Admitting: Oncology

## 2016-10-08 VITALS — BP 146/66 | HR 59 | Temp 98.2°F | Resp 18 | Ht 66.0 in | Wt 119.9 lb

## 2016-10-08 DIAGNOSIS — C50411 Malignant neoplasm of upper-outer quadrant of right female breast: Secondary | ICD-10-CM

## 2016-10-08 DIAGNOSIS — Z170 Estrogen receptor positive status [ER+]: Secondary | ICD-10-CM | POA: Diagnosis not present

## 2016-10-08 DIAGNOSIS — Z79810 Long term (current) use of selective estrogen receptor modulators (SERMs): Secondary | ICD-10-CM | POA: Diagnosis not present

## 2016-10-08 DIAGNOSIS — M810 Age-related osteoporosis without current pathological fracture: Secondary | ICD-10-CM

## 2016-10-08 LAB — CBC WITH DIFFERENTIAL/PLATELET
BASO%: 0.5 % (ref 0.0–2.0)
BASOS ABS: 0 10*3/uL (ref 0.0–0.1)
EOS ABS: 0.1 10*3/uL (ref 0.0–0.5)
EOS%: 1.9 % (ref 0.0–7.0)
HCT: 43.1 % (ref 34.8–46.6)
HEMOGLOBIN: 14.7 g/dL (ref 11.6–15.9)
LYMPH%: 34.2 % (ref 14.0–49.7)
MCH: 32.7 pg (ref 25.1–34.0)
MCHC: 34.1 g/dL (ref 31.5–36.0)
MCV: 95.8 fL (ref 79.5–101.0)
MONO#: 0.3 10*3/uL (ref 0.1–0.9)
MONO%: 7.2 % (ref 0.0–14.0)
NEUT#: 2.4 10*3/uL (ref 1.5–6.5)
NEUT%: 56.2 % (ref 38.4–76.8)
Platelets: 201 10*3/uL (ref 145–400)
RBC: 4.5 10*6/uL (ref 3.70–5.45)
RDW: 12.3 % (ref 11.2–14.5)
WBC: 4.3 10*3/uL (ref 3.9–10.3)
lymph#: 1.5 10*3/uL (ref 0.9–3.3)

## 2016-10-08 LAB — COMPREHENSIVE METABOLIC PANEL
ALBUMIN: 4.2 g/dL (ref 3.5–5.0)
ALT: 13 U/L (ref 0–55)
ANION GAP: 7 meq/L (ref 3–11)
AST: 17 U/L (ref 5–34)
Alkaline Phosphatase: 53 U/L (ref 40–150)
BILIRUBIN TOTAL: 0.43 mg/dL (ref 0.20–1.20)
BUN: 16.4 mg/dL (ref 7.0–26.0)
CHLORIDE: 106 meq/L (ref 98–109)
CO2: 29 meq/L (ref 22–29)
CREATININE: 0.9 mg/dL (ref 0.6–1.1)
Calcium: 9.8 mg/dL (ref 8.4–10.4)
EGFR: 70 mL/min/{1.73_m2} — ABNORMAL LOW (ref >90)
Glucose: 91 mg/dl (ref 70–140)
Potassium: 4 mEq/L (ref 3.5–5.1)
SODIUM: 142 meq/L (ref 136–145)
Total Protein: 6.6 g/dL (ref 6.4–8.3)

## 2016-10-08 NOTE — Progress Notes (Signed)
[facility]  Telephone:([phone]) [phone] Fax:(336) [phone]     ID: Diana Dunn DOB: 22-Jun-1945  MR#: [id number]  NKN#:[id number]  Patient Care Team: Lawerance Cruel, MD as PCP - [place] III, MD as Consulting Physician (General Surgery) Chauncey Cruel, MD as Consulting Physician (Oncology) Eppie Gibson, MD as Attending Physician (Radiation Oncology) Mauro Kaufmann, RN as Registered Nurse Rockwell Germany, RN as Registered Nurse Holley Bouche, NP as Nurse Practitioner (Nurse Practitioner) Lawerance Cruel, MD (Family Medicine) PCP: Melinda Crutch, MD OTHER MD:  CHIEF COMPLAINT: Triple positive breast cancer  CURRENT TREATMENT:  tamoxifen  BREAST CANCER HISTORY: From the original intake note:  Diana Dunn had not had a mammogram for approximately 4 years. Sometime mid 2015 she had an area of redness and tenderness in the right breast and she brought this to her physician's attention. She was treated with antibiotics and this completely cleared.Marland Kitchen  Approximately mid April, she again developed redness over the right breast. This was very focal, and it did not look "as red" as the previous time. She brought it to Dr. Harrington Challenger is attention, and was treated with antibiotics. Bilateral mammography with tomosynthesis and right breast ultrasonography was also obtained on 09/19/2014. There was a focal opacity with irregular margins in the upper outer right breast with some central calcifications. There was mild architectural distortion associated with this. On exam Dr. [name] noted a firm palpable tender mass lateral to the right areola with overlying erythema. On ultrasonography there was an irregular hypoechoic mass measuring 2.4 cm.  Repeat right breast ultrasonography after the patient completed her antibiotic course was performed 10/04/2014. On exam there was still a palpable lobulated mass lateral to the right nipple and by ultrasound this measured 2.6 cm. It was felt  to be a possible abscess and a second course of antibiotics, now with Septra, was undertaken. After that treatment, repeat ultrasonography 10/16/2014 continued to show the palpable mass and ultrasonography now found the mass to measure 2.9 cm maximally.  Accordingly biopsy of the mass in question was obtained that same day, 10/16/2014, and showed ([id number]) and invasive ductal carcinoma, grade 2 or 3, estrogen receptor 90% positive, progesterone receptor 80% positive, both with strong staining intensity, with an MIB-1 of 20%, and with HER-2 amplification, the signals ratio being 3.60 and the number per cell 4.50.  Bilateral breast MRI 10/23/2014 showed a breast density to be category B. In the right breast there was an irregular enhancing mass at the 9:30 o'clock position measuring 3.1 cm. There were no additional worrisome masses in the right breast, no findings of concern in the left breast, and no abnormal appearing lymph nodes.  The patient's subsequent history is as detailed below  INTERVAL HISTORY: Diana Dunn returns today for follow-up of her estrogen receptor positive breast cancer. Interval history is generally unremarkable. She continues on tamoxifen, with good tolerance. Hot flashes and vaginal wetness are not major issues. She obtains a drug at a good price.  REVIEW OF SYSTEMS: Diana Dunn' walks her grandson at least once a day and then she takes a walk also on her own. She is normally active otherwise. A detailed review of systems was entirely benign.  PAST MEDICAL HISTORY: Past Medical History:  Diagnosis Date  . Breast cancer ([place])   . Breast cancer of upper-outer quadrant of right female breast ([facility]) 10/19/2014  . Cancer ([place])    RIGHT BREAST CANCER  . History of hiatal hernia   . IBS (irritable  bowel syndrome)   . Lactose intolerance   . Personal history of chemotherapy   . Personal history of radiation therapy   Irritable bowel symptoms  PAST SURGICAL HISTORY: Past Surgical  History:  Procedure Laterality Date  . BREAST BIOPSY Right 10/16/2014  . BREAST LUMPECTOMY Right 03/28/2015  . BREAST SURGERY     BIOPSY OF BREAST  . COLONOSCOPY    . PORTACATH PLACEMENT Left 11/02/2014   Procedure: INSERTION PORT-A-CATH;  Surgeon: Autumn Messing III, MD;  Location: [facility];  Service: General;  Laterality: Left;  . RADIOACTIVE SEED GUIDED MASTECTOMY WITH AXILLARY SENTINEL LYMPH NODE BIOPSY Right 03/28/2015   Procedure: RADIOACTIVE SEED GUIDED PARTIAL MASTECTOMY WITH AXILLARY SENTINEL LYMPH NODE BIOPSY;  Surgeon: Autumn Messing III, MD;  Location: [facility];  Service: General;  Laterality: Right;    FAMILY HISTORY No family history on file. The patient's father died at the age of 69 from a myocardial infarction. The patient's mother died at the age of 69 following a stroke. The patient had 2 brothers. One had Down's syndrome. She had no sisters. The only breast cancer in the family was the patient's father's only sister was diagnosed with breast cancer in her 64s. There is no history of ovarian cancer in the family to the patient's knowledge   GYNECOLOGIC HISTORY:  No LMP recorded. Patient is postmenopausal.  menarche age 23, first live birth age 38, she is Diana Dunn P2. She went through the change of life in late 1999. She did not take hormone replacement. She took birth control pills for less than 2 years in her 57D, with no complications   SOCIAL HISTORY:  Diana Dunn is my neighbor. 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 Diana Dunn died from widely metastatic malignant melanoma within 4 months of diagnosis some years ago. The patient lives with her [name] and his wife, who had their first child late November 2016. Also Diana Dunn, the dog, lives there. The other son, Diana Dunn, lives in [place]. Both sons are appraiser's. The patient has 3 grandchildren.The patient attends the [employer]: Not in place. At the  10/24/2014 visit the patient was given the appropriate documents 2 complete and notarize at her discretion   HEALTH MAINTENANCE: Social History  Substance Use Topics  . Smoking status: Former Smoker    Types: Cigarettes    Quit date: 08/03/2014  . Smokeless tobacco: Not on file  . Alcohol use 3.6 oz/week    6 Glasses of wine per week     Colonoscopy: 2000?  PAP:  Bone density: On wrist, remote   Lipid panel:  Allergies  Allergen Reactions  . Aspirin     Stomach pain  . Aspirin Nausea Only    GI upset/pain    Current Outpatient Prescriptions  Medication Sig Dispense Refill  . cholecalciferol (VITAMIN D) 1000 UNITS tablet Take 1 tablet (1,000 Units total) by mouth daily.    . tamoxifen (NOLVADEX) 20 MG tablet take 1 tablet by mouth once daily 90 tablet 4   No current facility-administered medications for this visit.     OBJECTIVE: middle-aged white woman Who appears stated age 71:   10/08/16 1346  BP: (!) 146/66  Pulse: (!) 59  Resp: 18  Temp: 98.2 F (36.8 C)     Body mass index is 19.35 kg/m.    ECOG FS:0 - Asymptomatic Filed Weights   10/08/16 1346  Weight: 119  lb 14.4 oz (54.4 kg)   baseline weight 122 pounds  Sclerae unicteric, pupils round and equal Oropharynx clear and moist No cervical or supraclavicular adenopathy Lungs no rales or rhonchi Heart regular rate and rhythm Abd soft, nontender, positive bowel sounds MSK no focal spinal tenderness, no upper extremity lymphedema Neuro: nonfocal, well oriented, appropriate affect Breasts: The right breast is status post lumpectomy followed by radiation, with no evidence of local recurrence. The left breast is benign. Both axillae are benign.   LAB RESULTS:  CMP     Component Value Date/Time   NA 142 10/08/2016 1319   K 4.0 10/08/2016 1319   CL 104 11/01/2014 1023   CO2 29 10/08/2016 1319   GLUCOSE 91 10/08/2016 1319   BUN 16.4 10/08/2016 1319   CREATININE 0.9 10/08/2016 1319   CALCIUM 9.8  10/08/2016 1319   PROT 6.6 10/08/2016 1319   ALBUMIN 4.2 10/08/2016 1319   AST 17 10/08/2016 1319   ALT 13 10/08/2016 1319   ALKPHOS 53 10/08/2016 1319   BILITOT 0.43 10/08/2016 1319   GFRNONAA >60 11/01/2014 1023   GFRAA >60 11/01/2014 1023    INo results found for: SPEP, UPEP  Lab Results  Component Value Date   WBC 4.3 10/08/2016   NEUTROABS 2.4 10/08/2016   HGB 14.7 10/08/2016   HCT 43.1 10/08/2016   MCV 95.8 10/08/2016   PLT 201 10/08/2016      Chemistry      Component Value Date/Time   NA 142 10/08/2016 1319   K 4.0 10/08/2016 1319   CL 104 11/01/2014 1023   CO2 29 10/08/2016 1319   BUN 16.4 10/08/2016 1319   CREATININE 0.9 10/08/2016 1319      Component Value Date/Time   CALCIUM 9.8 10/08/2016 1319   ALKPHOS 53 10/08/2016 1319   AST 17 10/08/2016 1319   ALT 13 10/08/2016 1319   BILITOT 0.43 10/08/2016 1319       No results found for: LABCA2  No components found for: LABCA125  No results for input(s): INR in the last 168 hours.  Urinalysis    Component Value Date/Time   LABSPEC 1.020 12/21/2014 1405   PHURINE 5.0 12/21/2014 1405   GLUCOSEU Negative 12/21/2014 1405   HGBUR Negative 12/21/2014 1405   BILIRUBINUR Negative 12/21/2014 1405   KETONESUR Negative 12/21/2014 1405   PROTEINUR < 30 12/21/2014 1405   UROBILINOGEN 0.2 12/21/2014 1405   NITRITE Negative 12/21/2014 1405   LEUKOCYTESUR Small 12/21/2014 1405    STUDIES: Mammography at the [facility] 09/25/2016 found the breast density to be category C. There was no evidence of malignancy.  ASSESSMENT: 71 y.o. Oriental woman status post right breastUpper outer quadrant biopsy 10/16/2014 for a clinical T3 N0, stage IIA invasive ductal carcinoma, grade 2 or 3, estrogen and progesterone receptor positive, with an MIB-1 of 20%, and HER-2 amplified, with a signals ratio of 3.60  (1) chemotherapy and anti-HER-2 immunotherapy started 11/05/2014, consisting of carboplatin, docetaxel, trastuzumab  and pertuzumab given every 21 days 6, with Neulasta support-- completed 02/28/2015  (2) trastuzumab continued to complete 1 year (last dose 11/08/2015)  (a) echocardiogram 09/06/2015 shows an ejection fraction of 60-65%  (3)  right lumpectomy and sentinel lymph node sampling 03/28/2015 showed a pT1c pN0 residual invasive ductal carcinoma, grade 2, with negative margins.  (4) adjuvant radiation 05/02/2015-06/14/2015  (5)  Tamoxifen started January 2017  (6) osteoporosis with T score -3.5 on bone density scan 09/20/2015  PLAN: Diana Dunn is now 1-1/2 years out from  definitive surgery for her breast cancer with no evidence of disease recurrence. This is very favorable.  She is tolerating tamoxifen remarkably well, really with no side effects at all. She is paying a little more for than I would like, but she does like her pharmacy and does not want to go to a different one just for a few dollars difference.  The plan continues for tamoxifen to a total of 5 years.  At this point I'm comfortable seeing her on a once a year basis. She knows to call for any problems that may develop before her next visit.        Chauncey Cruel, MD   10/11/2016 8:09 PM

## 2016-12-15 DIAGNOSIS — J329 Chronic sinusitis, unspecified: Secondary | ICD-10-CM | POA: Diagnosis not present

## 2016-12-31 DIAGNOSIS — R918 Other nonspecific abnormal finding of lung field: Secondary | ICD-10-CM | POA: Diagnosis not present

## 2016-12-31 DIAGNOSIS — M542 Cervicalgia: Secondary | ICD-10-CM | POA: Diagnosis not present

## 2016-12-31 DIAGNOSIS — M546 Pain in thoracic spine: Secondary | ICD-10-CM | POA: Diagnosis not present

## 2016-12-31 DIAGNOSIS — S20211A Contusion of right front wall of thorax, initial encounter: Secondary | ICD-10-CM | POA: Diagnosis not present

## 2016-12-31 DIAGNOSIS — T148XXA Other injury of unspecified body region, initial encounter: Secondary | ICD-10-CM | POA: Diagnosis not present

## 2016-12-31 DIAGNOSIS — Z886 Allergy status to analgesic agent status: Secondary | ICD-10-CM | POA: Diagnosis not present

## 2016-12-31 DIAGNOSIS — K8020 Calculus of gallbladder without cholecystitis without obstruction: Secondary | ICD-10-CM | POA: Diagnosis not present

## 2016-12-31 DIAGNOSIS — R0789 Other chest pain: Secondary | ICD-10-CM | POA: Diagnosis not present

## 2017-01-08 DIAGNOSIS — R110 Nausea: Secondary | ICD-10-CM | POA: Diagnosis not present

## 2017-01-08 DIAGNOSIS — S20219A Contusion of unspecified front wall of thorax, initial encounter: Secondary | ICD-10-CM | POA: Diagnosis not present

## 2017-04-08 DIAGNOSIS — Z23 Encounter for immunization: Secondary | ICD-10-CM | POA: Diagnosis not present

## 2017-04-15 DIAGNOSIS — L03116 Cellulitis of left lower limb: Secondary | ICD-10-CM | POA: Diagnosis not present

## 2017-06-04 IMAGING — MG MM DIAG BREAST TOMO BILATERAL
9 of 14 series · 9 of 30 positions shown · non-contrast
Comparison: Previous exam(s).

CLINICAL DATA: History of right breast cancer in 7049 status post
breast conservation therapy.

EXAM:
2D DIGITAL DIAGNOSTIC BILATERAL MAMMOGRAM WITH CAD AND ADJUNCT TOMO

[R CC (1 of 2)]
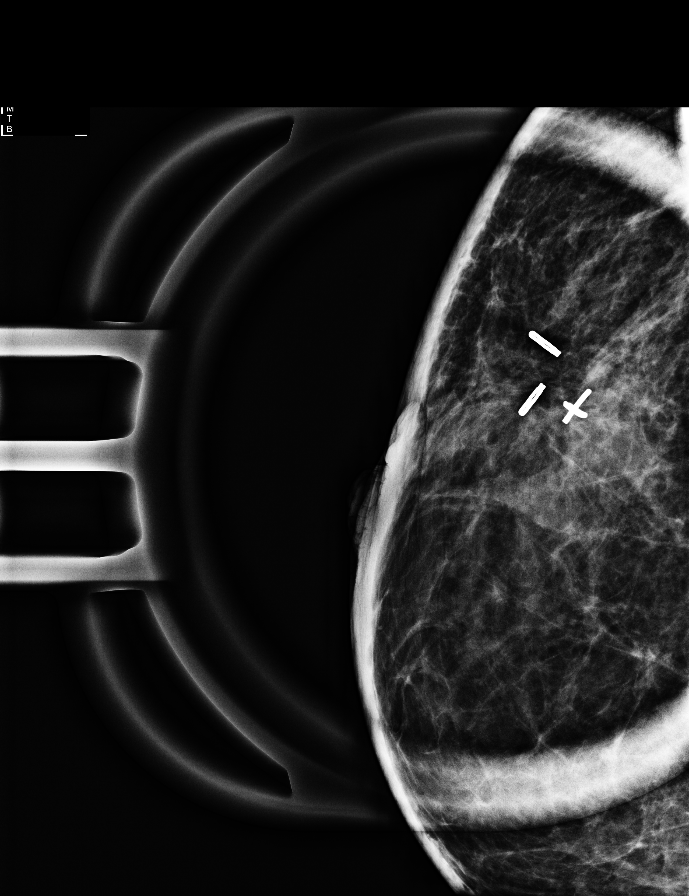

[R MLO]
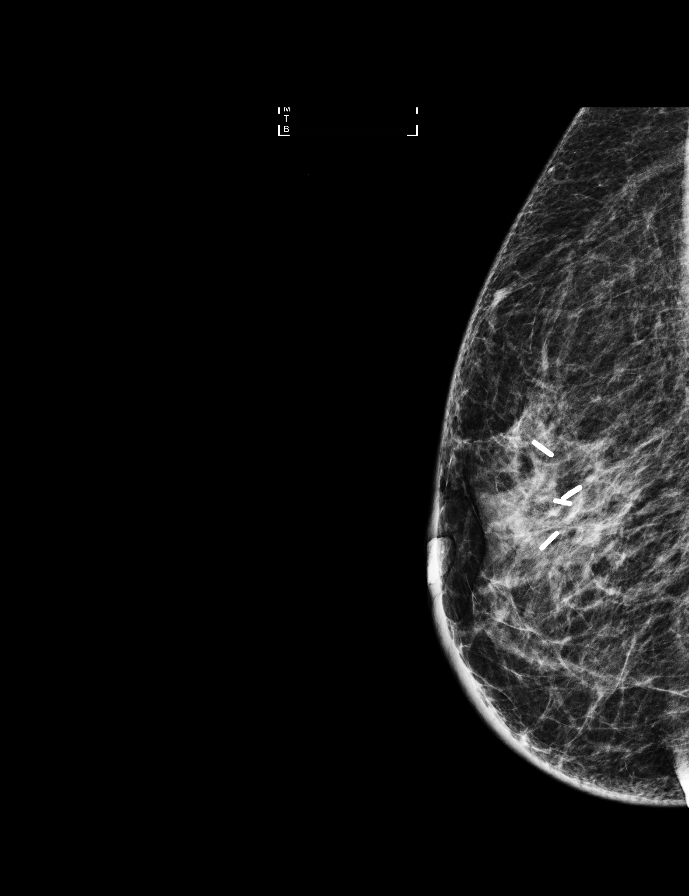

[L MLO]
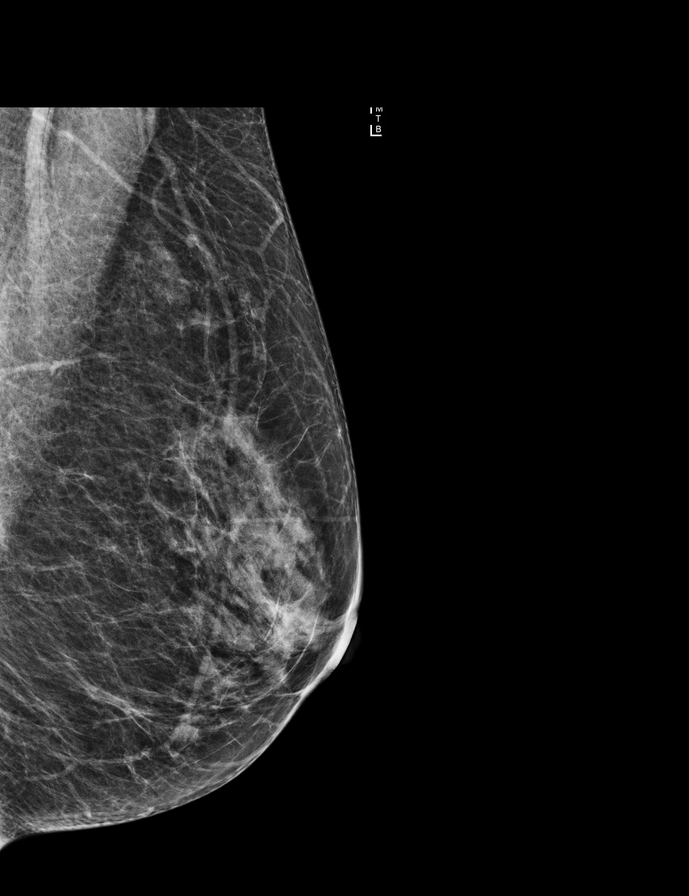

[R CC (2 of 2)]
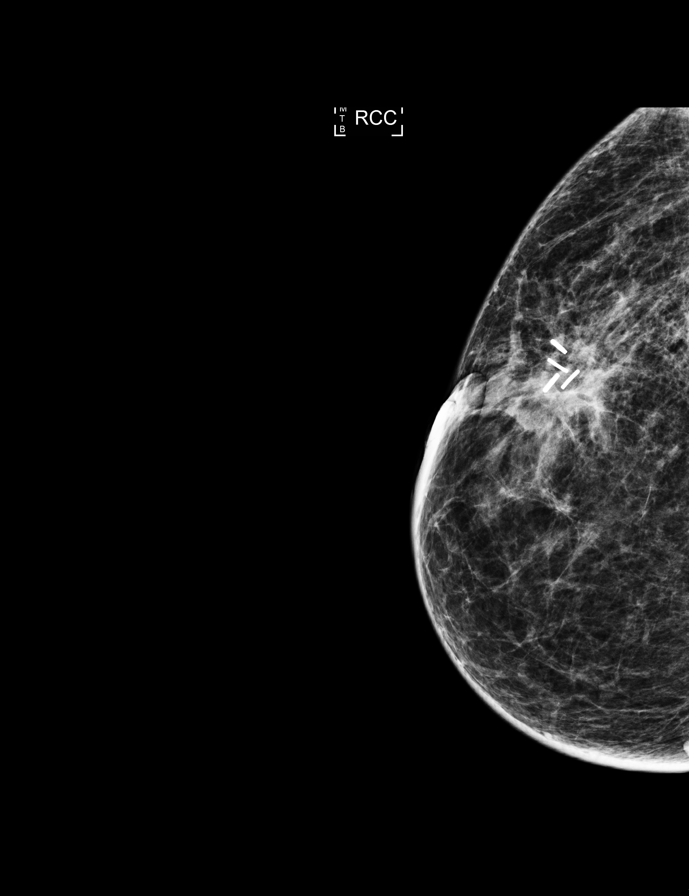

[R CC synth-2D]
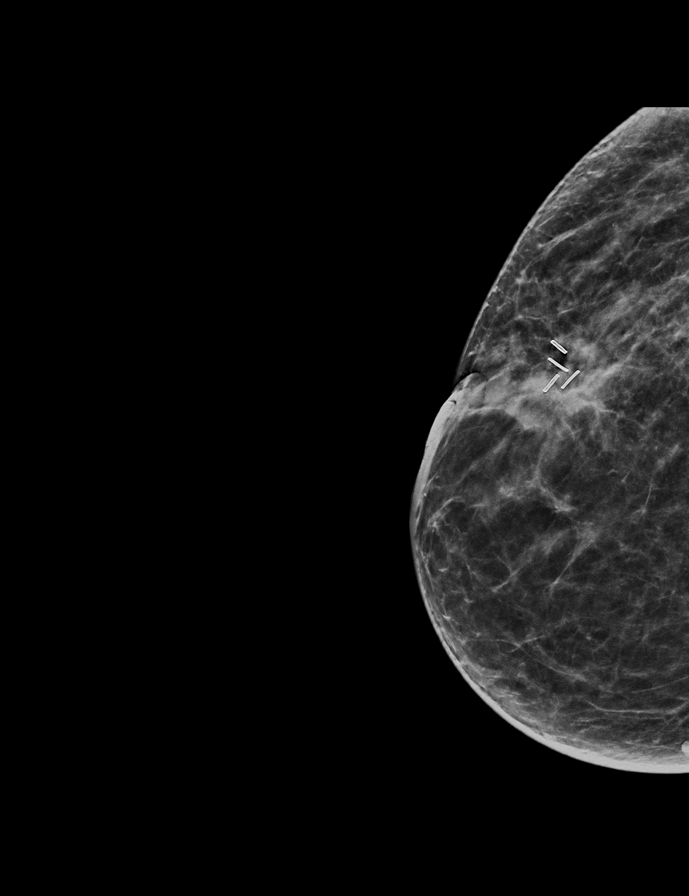

[R MLO synth-2D]
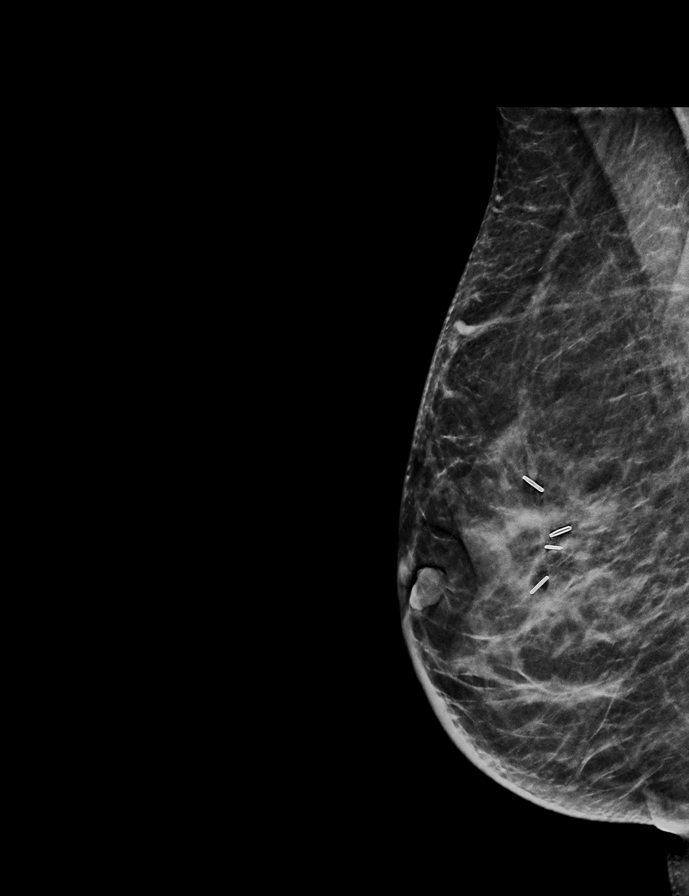

[L CC synth-2D]
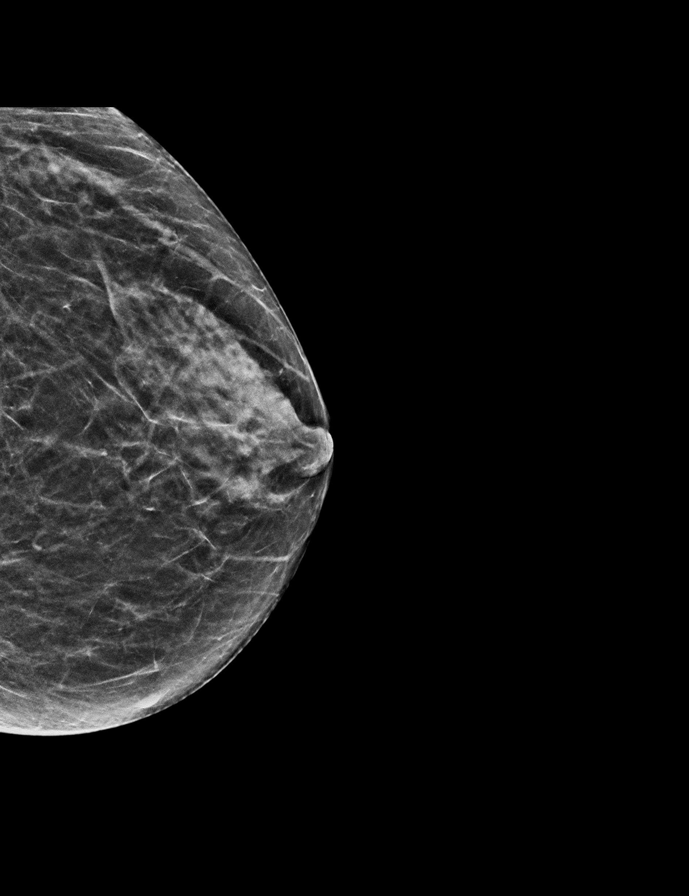

[L MLO synth-2D]
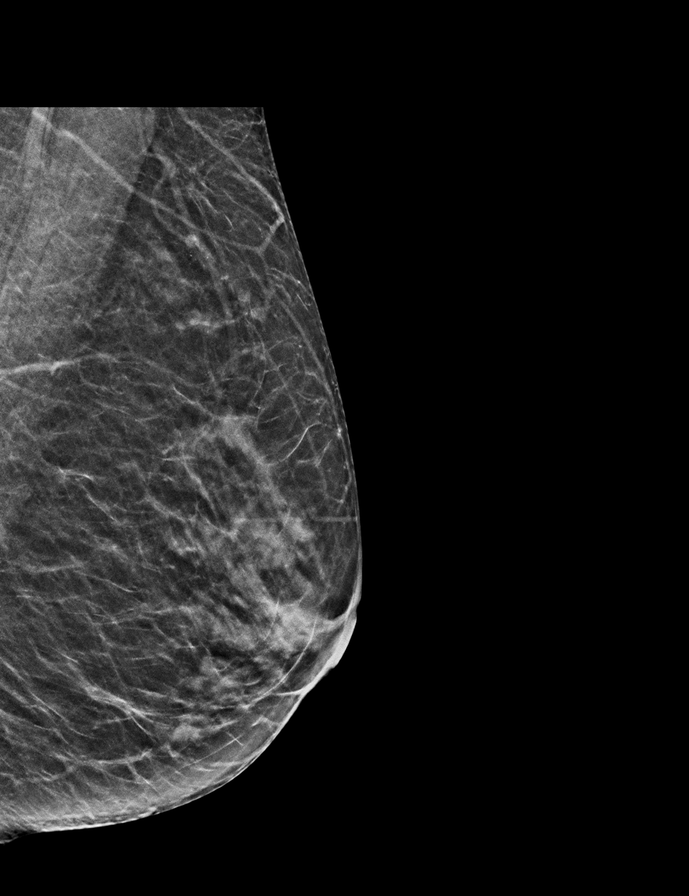

[L CC]
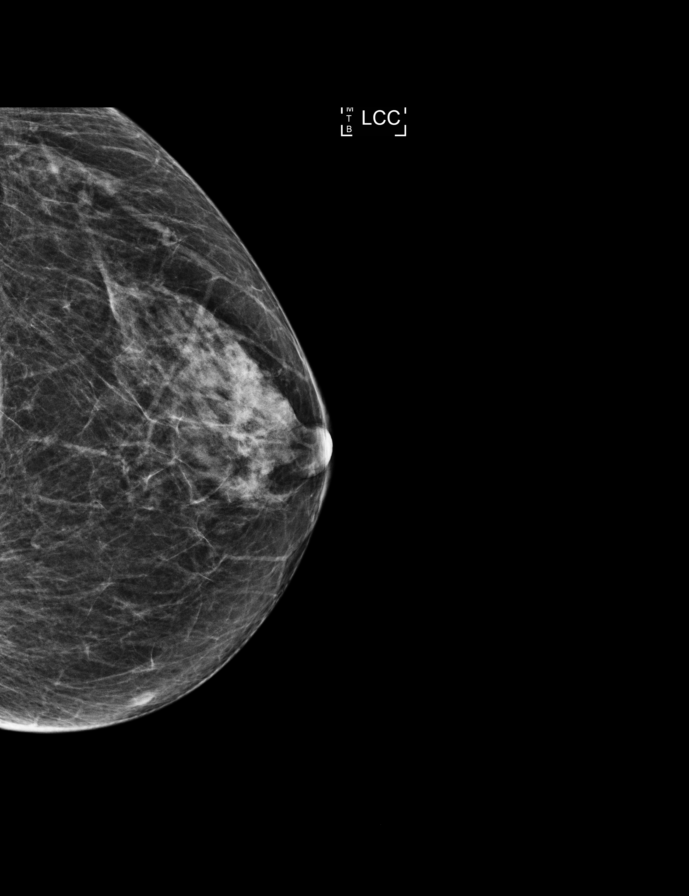

[9 of 30 positions shown; findings below may reference images not displayed]

ACR Breast Density Category c: The breast tissue is heterogeneously
dense, which may obscure small masses.
FINDINGS: There are expected postsurgical changes within the right breast.
There are no new dominant masses, suspicious calcifications or
secondary signs of malignancy within either breast.

Mammographic images were processed with CAD.
IMPRESSION: No evidence of malignancy within either breast. Expected
postsurgical changes in the right breast.

RECOMMENDATION:
Bilateral diagnostic mammogram in 1 year.

I have discussed the findings and recommendations with the patient.
Results were also provided in writing at the conclusion of the
visit. If applicable, a reminder letter will be sent to the patient
regarding the next appointment.

BI-RADS CATEGORY  2: Benign.

## 2017-08-20 DIAGNOSIS — H2513 Age-related nuclear cataract, bilateral: Secondary | ICD-10-CM | POA: Diagnosis not present

## 2017-09-15 ENCOUNTER — Other Ambulatory Visit: Payer: Self-pay | Admitting: Oncology

## 2017-09-15 DIAGNOSIS — Z853 Personal history of malignant neoplasm of breast: Secondary | ICD-10-CM

## 2017-10-01 ENCOUNTER — Other Ambulatory Visit: Payer: Self-pay | Admitting: Oncology

## 2017-10-01 ENCOUNTER — Ambulatory Visit
Admission: RE | Admit: 2017-10-01 | Discharge: 2017-10-01 | Disposition: A | Discharge status: Home / Self Care | Payer: Medicare Other | Source: Ambulatory Visit | Attending: Oncology | Admitting: Oncology

## 2017-10-01 DIAGNOSIS — Z853 Personal history of malignant neoplasm of breast: Secondary | ICD-10-CM

## 2017-10-01 DIAGNOSIS — R921 Mammographic calcification found on diagnostic imaging of breast: Secondary | ICD-10-CM
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
## 2017-10-11 ENCOUNTER — Telehealth: Payer: Self-pay | Admitting: Oncology

## 2017-10-11 ENCOUNTER — Inpatient Hospital Stay: Payer: Medicare Other

## 2017-10-11 ENCOUNTER — Inpatient Hospital Stay: Payer: Medicare Other | Attending: Oncology | Admitting: Oncology

## 2017-10-11 VITALS — BP 154/67 | HR 57 | Temp 97.7°F | Resp 18 | Ht 66.0 in | Wt 123.2 lb

## 2017-10-11 DIAGNOSIS — Z87891 Personal history of nicotine dependence: Secondary | ICD-10-CM | POA: Insufficient documentation

## 2017-10-11 DIAGNOSIS — Z9221 Personal history of antineoplastic chemotherapy: Secondary | ICD-10-CM

## 2017-10-11 DIAGNOSIS — Z170 Estrogen receptor positive status [ER+]: Secondary | ICD-10-CM | POA: Diagnosis not present

## 2017-10-11 DIAGNOSIS — M810 Age-related osteoporosis without current pathological fracture: Secondary | ICD-10-CM | POA: Insufficient documentation

## 2017-10-11 DIAGNOSIS — C50411 Malignant neoplasm of upper-outer quadrant of right female breast: Secondary | ICD-10-CM | POA: Diagnosis not present

## 2017-10-11 DIAGNOSIS — Z79810 Long term (current) use of selective estrogen receptor modulators (SERMs): Secondary | ICD-10-CM | POA: Diagnosis not present

## 2017-10-11 LAB — CBC WITH DIFFERENTIAL/PLATELET
BASOS ABS: 0 10*3/uL (ref 0.0–0.1)
BASOS PCT: 0 %
EOS ABS: 0.1 10*3/uL (ref 0.0–0.5)
Eosinophils Relative: 2 %
HCT: 43.4 % (ref 34.8–46.6)
HEMOGLOBIN: 14.6 g/dL (ref 11.6–15.9)
Lymphocytes Relative: 34 %
Lymphs Abs: 1.7 10*3/uL (ref 0.9–3.3)
MCH: 32.8 pg (ref 25.1–34.0)
MCHC: 33.6 g/dL (ref 31.5–36.0)
MCV: 97.5 fL (ref 79.5–101.0)
MONO ABS: 0.3 10*3/uL (ref 0.1–0.9)
Monocytes Relative: 7 %
NEUTROS ABS: 2.8 10*3/uL (ref 1.5–6.5)
NEUTROS PCT: 57 %
Platelets: 229 10*3/uL (ref 145–400)
RBC: 4.45 MIL/uL (ref 3.70–5.45)
RDW: 12.4 % (ref 11.2–14.5)
WBC: 5 10*3/uL (ref 3.9–10.3)

## 2017-10-11 LAB — COMPREHENSIVE METABOLIC PANEL
ALK PHOS: 51 U/L (ref 40–150)
ALT: 10 U/L (ref 0–55)
ANION GAP: 5 (ref 3–11)
AST: 15 U/L (ref 5–34)
Albumin: 4.1 g/dL (ref 3.5–5.0)
BILIRUBIN TOTAL: 0.4 mg/dL (ref 0.2–1.2)
BUN: 15 mg/dL (ref 7–26)
CALCIUM: 9.5 mg/dL (ref 8.4–10.4)
CO2: 29 mmol/L (ref 22–29)
CREATININE: 0.84 mg/dL (ref 0.60–1.10)
Chloride: 106 mmol/L (ref 98–109)
GFR calc non Af Amer: >60 mL/min (ref >60)
Glucose, Bld: 84 mg/dL (ref 70–140)
Potassium: 4.6 mmol/L (ref 3.5–5.1)
Sodium: 140 mmol/L (ref 136–145)
TOTAL PROTEIN: 6.6 g/dL (ref 6.4–8.3)

## 2017-10-11 NOTE — Telephone Encounter (Signed)
Gave patient AVS and calendar of upcoming October and April 2020 appointments.

## 2017-10-20 ENCOUNTER — Other Ambulatory Visit: Payer: Self-pay | Admitting: Oncology

## 2017-12-07 DIAGNOSIS — J069 Acute upper respiratory infection, unspecified: Secondary | ICD-10-CM | POA: Diagnosis not present

## 2017-12-17 DIAGNOSIS — R05 Cough: Secondary | ICD-10-CM | POA: Diagnosis not present

## 2018-02-10 ENCOUNTER — Other Ambulatory Visit: Payer: Self-pay | Admitting: Oncology

## 2018-03-18 DIAGNOSIS — Z23 Encounter for immunization: Secondary | ICD-10-CM | POA: Diagnosis not present

## 2018-04-05 ENCOUNTER — Ambulatory Visit
Admission: RE | Admit: 2018-04-05 | Discharge: 2018-04-05 | Disposition: A | Discharge status: Home / Self Care | Payer: Medicare Other | Source: Ambulatory Visit | Attending: Oncology | Admitting: Oncology

## 2018-04-05 ENCOUNTER — Other Ambulatory Visit: Payer: Self-pay | Admitting: Oncology

## 2018-04-05 DIAGNOSIS — R921 Mammographic calcification found on diagnostic imaging of breast: Secondary | ICD-10-CM

## 2018-04-09 DIAGNOSIS — Z23 Encounter for immunization: Secondary | ICD-10-CM | POA: Diagnosis not present

## 2018-06-01 ENCOUNTER — Other Ambulatory Visit: Payer: Self-pay | Admitting: Oncology

## 2018-08-10 DIAGNOSIS — H25813 Combined forms of age-related cataract, bilateral: Secondary | ICD-10-CM | POA: Diagnosis not present

## 2018-08-10 DIAGNOSIS — H5213 Myopia, bilateral: Secondary | ICD-10-CM | POA: Diagnosis not present

## 2018-08-10 DIAGNOSIS — H52203 Unspecified astigmatism, bilateral: Secondary | ICD-10-CM | POA: Diagnosis not present

## 2018-09-02 ENCOUNTER — Other Ambulatory Visit: Payer: Self-pay | Admitting: Oncology

## 2018-10-12 ENCOUNTER — Other Ambulatory Visit: Payer: Medicare Other

## 2018-10-12 ENCOUNTER — Ambulatory Visit: Payer: Medicare Other | Admitting: Oncology

## 2018-11-01 ENCOUNTER — Telehealth: Payer: Self-pay | Admitting: Oncology

## 2018-11-01 NOTE — Telephone Encounter (Signed)
Called patient regarding upcoming Webex appointment, patient would like to reschedule until July after she get's her mammogram done. Follow up appointment is now 07/02.  Message to provider.

## 2018-11-01 NOTE — Telephone Encounter (Signed)
Spoke with pt and she wants to cancel and reschedule her 11/02/18 appt for anytime after 11/25/18 because Dr. Jana Hakim usually likes to see her after procedure is done.

## 2018-11-02 ENCOUNTER — Inpatient Hospital Stay: Payer: Medicare Other | Admitting: Oncology

## 2018-12-09 ENCOUNTER — Ambulatory Visit
Admission: RE | Admit: 2018-12-09 | Discharge: 2018-12-09 | Disposition: A | Discharge status: Home / Self Care | Payer: Medicare Other | Source: Ambulatory Visit | Attending: Oncology | Admitting: Oncology

## 2018-12-09 ENCOUNTER — Other Ambulatory Visit: Payer: Self-pay

## 2018-12-09 DIAGNOSIS — R921 Mammographic calcification found on diagnostic imaging of breast: Secondary | ICD-10-CM | POA: Diagnosis not present

## 2018-12-09 DIAGNOSIS — Z853 Personal history of malignant neoplasm of breast: Secondary | ICD-10-CM | POA: Diagnosis not present

## 2018-12-15 ENCOUNTER — Telehealth: Payer: Self-pay | Admitting: Oncology

## 2018-12-15 ENCOUNTER — Inpatient Hospital Stay: Payer: Medicare Other | Attending: Oncology | Admitting: Oncology

## 2018-12-15 DIAGNOSIS — Z170 Estrogen receptor positive status [ER+]: Secondary | ICD-10-CM

## 2018-12-15 DIAGNOSIS — C50411 Malignant neoplasm of upper-outer quadrant of right female breast: Secondary | ICD-10-CM

## 2018-12-15 NOTE — Telephone Encounter (Signed)
Called pt per 7/2 sch message - unable to reach pt . Left message for patient to call back for reschedule.

## 2018-12-15 NOTE — Progress Notes (Signed)
Patient did not show up today.  She did not call to cancel, which is unusual for her.  I will set her up for a WebEx visit sometime in August.

## 2018-12-30 ENCOUNTER — Other Ambulatory Visit: Payer: Self-pay | Admitting: Oncology

## 2019-01-03 ENCOUNTER — Other Ambulatory Visit: Payer: Self-pay | Admitting: *Deleted

## 2019-02-17 DIAGNOSIS — H43812 Vitreous degeneration, left eye: Secondary | ICD-10-CM | POA: Diagnosis not present

## 2019-03-03 DIAGNOSIS — H43812 Vitreous degeneration, left eye: Secondary | ICD-10-CM | POA: Diagnosis not present

## 2019-03-30 ENCOUNTER — Other Ambulatory Visit: Payer: Self-pay | Admitting: Oncology

## 2019-03-30 DIAGNOSIS — Z23 Encounter for immunization: Secondary | ICD-10-CM | POA: Diagnosis not present

## 2019-03-31 ENCOUNTER — Telehealth: Payer: Self-pay | Admitting: Oncology

## 2019-03-31 NOTE — Telephone Encounter (Signed)
Confirmed 12/10 f/u with patient.

## 2019-04-21 DIAGNOSIS — Z23 Encounter for immunization: Secondary | ICD-10-CM | POA: Diagnosis not present

## 2019-05-24 ENCOUNTER — Telehealth: Payer: Self-pay | Admitting: Oncology

## 2019-05-24 NOTE — Telephone Encounter (Signed)
Patient returned phone call regarding voicemail that was left, per patient' request 12/10 appointment has moved to 01/12.

## 2019-05-24 NOTE — Telephone Encounter (Signed)
Returned patient's phone call regarding rescheduling an appointment, left a voicemail. 

## 2019-05-25 ENCOUNTER — Ambulatory Visit: Payer: Medicare Other | Admitting: Oncology

## 2019-06-26 NOTE — Progress Notes (Signed)
[facility]  Telephone:(336) [phone] Fax:(336) [phone]     ID: Diana Dunn DOB: 05-Jan-1946  MR#: [id number]  VPX#:[id number]  Patient Care Team: Lawerance Cruel, MD as PCP - General Jovita Kussmaul, MD as Consulting Physician (General Surgery) Jasiah Buntin, Virgie Dad, MD as Consulting Physician (Oncology) Eppie Gibson, MD as Attending Physician (Radiation Oncology) Mauro Kaufmann, RN as Registered Nurse Rockwell Germany, RN as Registered Nurse Holley Bouche, NP (Inactive) as Nurse Practitioner (Nurse Practitioner) Lawerance Cruel, MD (Family Medicine) OTHER MD:  CHIEF COMPLAINT: Triple positive breast cancer  CURRENT TREATMENT:  tamoxifen   INTERVAL HISTORY: Diana Dunn did not show for her appointment on 06/27/2019  Since her last visit, she underwent bilateral diagnostic mammography with tomography at [facility] on 12/09/2018 showing: breast density category B; no evidence of malignancy in either breast; right lumpectomy changes with benign dystrophic calcifications.    REVIEW OF SYSTEMS: Diana Dunn    BREAST CANCER HISTORY: From the original intake note:  Diana Dunn had not had a mammogram for approximately 4 years. Sometime mid 2015 she had an area of redness and tenderness in the right breast and she brought this to her physician's attention. She was treated with antibiotics and this completely cleared.Marland Kitchen  Approximately mid April, she again developed redness over the right breast. This was very focal, and it did not look "as red" as the previous time. She brought it to Dr. Harrington Challenger is attention, and was treated with antibiotics. Bilateral mammography with tomosynthesis and right breast ultrasonography was also obtained on 09/19/2014. There was a focal opacity with irregular margins in the upper outer right breast with some central calcifications. There was mild architectural distortion associated with this. On exam Dr. [name] noted a firm palpable  tender mass lateral to the right areola with overlying erythema. On ultrasonography there was an irregular hypoechoic mass measuring 2.4 cm.  Repeat right breast ultrasonography after the patient completed her antibiotic course was performed 10/04/2014. On exam there was still a palpable lobulated mass lateral to the right nipple and by ultrasound this measured 2.6 cm. It was felt to be a possible abscess and a second course of antibiotics, now with Septra, was undertaken. After that treatment, repeat ultrasonography 10/16/2014 continued to show the palpable mass and ultrasonography now found the mass to measure 2.9 cm maximally.  Accordingly biopsy of the mass in question was obtained that same day, 10/16/2014, and showed ([id number]) and invasive ductal carcinoma, grade 2 or 3, estrogen receptor 90% positive, progesterone receptor 80% positive, both with strong staining intensity, with an MIB-1 of 20%, and with HER-2 amplification, the signals ratio being 3.60 and the number per cell 4.50.  Bilateral breast MRI 10/23/2014 showed a breast density to be category B. In the right breast there was an irregular enhancing mass at the 9:30 o'clock position measuring 3.1 cm. There were no additional worrisome masses in the right breast, no findings of concern in the left breast, and no abnormal appearing lymph nodes.  The patient's subsequent history is as detailed below   PAST MEDICAL HISTORY: Past Medical History:  Diagnosis Date  . Breast cancer ([place])   . Breast cancer of upper-outer quadrant of right female breast ([place]) 10/19/2014  . Cancer ([place])    RIGHT BREAST CANCER  . History of hiatal hernia   . IBS (irritable bowel syndrome)   . Lactose intolerance   . Personal history of chemotherapy   . Personal history of radiation therapy  PAST SURGICAL HISTORY: Past Surgical History:  Procedure Laterality Date  . BREAST BIOPSY Right 10/16/2014  . BREAST LUMPECTOMY Right 03/28/2015  . BREAST  SURGERY     BIOPSY OF BREAST  . COLONOSCOPY    . PORTACATH PLACEMENT Left 11/02/2014   Procedure: INSERTION PORT-A-CATH;  Surgeon: Autumn Messing III, MD;  Location: [facility];  Service: General;  Laterality: Left;  . RADIOACTIVE SEED GUIDED PARTIAL MASTECTOMY WITH AXILLARY SENTINEL LYMPH NODE BIOPSY Right 03/28/2015   Procedure: RADIOACTIVE SEED GUIDED PARTIAL MASTECTOMY WITH AXILLARY SENTINEL LYMPH NODE BIOPSY;  Surgeon: Autumn Messing III, MD;  Location: [facility];  Service: General;  Laterality: Right;    FAMILY HISTORY No family history on file. The patient's father died at the age of 69 from a myocardial infarction. The patient's mother died at the age of 29 following a stroke. The patient had 2 brothers. One had Down's syndrome. She had no sisters. The only breast cancer in the family was the patient's father's only sister was diagnosed with breast cancer in her 82s. There is no history of ovarian cancer in the family to the patient's knowledge    GYNECOLOGIC HISTORY:  No LMP recorded. Patient is postmenopausal.  menarche age 24, first live birth age 40, she is [place] P2. She went through the change of life in late 1999. She did not take hormone replacement. She took birth control pills for less than 2 years in her 71G, with no complications    SOCIAL HISTORY:  Diana Dunn is my neighbor. 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 Diana Dunn died from widely metastatic malignant melanoma within 4 months of diagnosis some years ago. The patient lives with her [name] and his wife, who had their first child late November 2016. Also Diana Dunn, the dog, lives there. The other son, Diana Dunn, lives in [place]. Both sons are appraiser's. The patient has 3 grandchildren. Diana Dunn is the one in [place] (Diana Dunn's son).The patient attends the [employer]: Not in place. At the 10/24/2014 visit the patient was given the appropriate  documents 2 complete and notarize at her discretion   HEALTH MAINTENANCE: Social History   Tobacco Use  . Smoking status: Former Smoker    Types: Cigarettes    Quit date: 08/03/2014    Years since quitting: 4.9  Substance Use Topics  . Alcohol use: Yes    Alcohol/week: 6.0 standard drinks    Types: 6 Glasses of wine per week  . Drug use: No     Colonoscopy: 2000?  PAP:  Bone density: On wrist, remote   Lipid panel:  Allergies  Allergen Reactions  . Aspirin     Stomach pain  . Aspirin Nausea Only    GI upset/pain    Current Outpatient Medications  Medication Sig Dispense Refill  . cholecalciferol (VITAMIN D) 1000 UNITS tablet Take 1 tablet (1,000 Units total) by mouth daily.    . tamoxifen (NOLVADEX) 20 MG tablet TAKE 1 TABLET BY MOUTH EVERY DAY 90 tablet 0   No current facility-administered medications for this visit.    OBJECTIVE: middle-aged white woman   There were no vitals filed for this visit.   There is no height or weight on file to calculate BMI.   There were no vitals filed for this visit. baseline weight 122 pounds   LAB RESULTS:  CMP     Component Value Date/Time   NA 140  10/11/2017 1328   NA 142 10/08/2016 1319   K 4.6 10/11/2017 1328   K 4.0 10/08/2016 1319   CL 106 10/11/2017 1328   CO2 29 10/11/2017 1328   CO2 29 10/08/2016 1319   GLUCOSE 84 10/11/2017 1328   GLUCOSE 91 10/08/2016 1319   BUN 15 10/11/2017 1328   BUN 16.4 10/08/2016 1319   CREATININE 0.84 10/11/2017 1328   CREATININE 0.9 10/08/2016 1319   CALCIUM 9.5 10/11/2017 1328   CALCIUM 9.8 10/08/2016 1319   PROT 6.6 10/11/2017 1328   PROT 6.6 10/08/2016 1319   ALBUMIN 4.1 10/11/2017 1328   ALBUMIN 4.2 10/08/2016 1319   AST 15 10/11/2017 1328   AST 17 10/08/2016 1319   ALT 10 10/11/2017 1328   ALT 13 10/08/2016 1319   ALKPHOS 51 10/11/2017 1328   ALKPHOS 53 10/08/2016 1319   BILITOT 0.4 10/11/2017 1328   BILITOT 0.43 10/08/2016 1319   GFRNONAA >60 10/11/2017 1328   GFRAA  >60 10/11/2017 1328    INo results found for: SPEP, UPEP  Lab Results  Component Value Date   WBC 5.0 10/11/2017   NEUTROABS 2.8 10/11/2017   HGB 14.6 10/11/2017   HCT 43.4 10/11/2017   MCV 97.5 10/11/2017   PLT 229 10/11/2017      Chemistry      Component Value Date/Time   NA 140 10/11/2017 1328   NA 142 10/08/2016 1319   K 4.6 10/11/2017 1328   K 4.0 10/08/2016 1319   CL 106 10/11/2017 1328   CO2 29 10/11/2017 1328   CO2 29 10/08/2016 1319   BUN 15 10/11/2017 1328   BUN 16.4 10/08/2016 1319   CREATININE 0.84 10/11/2017 1328   CREATININE 0.9 10/08/2016 1319      Component Value Date/Time   CALCIUM 9.5 10/11/2017 1328   CALCIUM 9.8 10/08/2016 1319   ALKPHOS 51 10/11/2017 1328   ALKPHOS 53 10/08/2016 1319   AST 15 10/11/2017 1328   AST 17 10/08/2016 1319   ALT 10 10/11/2017 1328   ALT 13 10/08/2016 1319   BILITOT 0.4 10/11/2017 1328   BILITOT 0.43 10/08/2016 1319      No results found for: LABCA2  No components found for: LABCA125  No results for input(s): INR in the last 168 hours.  Urinalysis    Component Value Date/Time   LABSPEC 1.020 12/21/2014 1405   PHURINE 5.0 12/21/2014 1405   GLUCOSEU Negative 12/21/2014 1405   HGBUR Negative 12/21/2014 1405   BILIRUBINUR Negative 12/21/2014 1405   KETONESUR Negative 12/21/2014 1405   PROTEINUR < 30 12/21/2014 1405   UROBILINOGEN 0.2 12/21/2014 1405   NITRITE Negative 12/21/2014 1405   LEUKOCYTESUR Small 12/21/2014 1405    STUDIES: No results found.    ASSESSMENT: 74 y.o. Diana Dunn woman status post right breastUpper outer quadrant biopsy 10/16/2014 for a clinical T3 N0, stage IIA invasive ductal carcinoma, grade 2 or 3, estrogen and progesterone receptor positive, with an MIB-1 of 20%, and HER-2 amplified, with a signals ratio of 3.60  (1) chemotherapy and anti-HER-2 immunotherapy started 11/05/2014, consisting of carboplatin, docetaxel, trastuzumab and pertuzumab given every 21 days 6, with Neulasta  support-- completed 02/28/2015  (2) trastuzumab continued to complete 1 year (last dose 11/08/2015)  (a) echocardiogram 09/06/2015 shows an ejection fraction of 60-65%  (3)  right lumpectomy and sentinel lymph node sampling 03/28/2015 showed a pT1c pN0 residual invasive ductal carcinoma, grade 2, with negative margins.  (4) adjuvant radiation 05/02/2015-06/14/2015  (5) tamoxifen started January 2017  (  6) osteoporosis with T score -3.5 on bone density scan 09/20/2015   PLAN: Eriyonna did not show for her appointment 06/27/2019.  I have sent her a letter asking her to reschedule   Sharni Negron, Virgie Dad, MD  06/27/19 12:53 PM Medical Oncology and Hematology [facility] [place], [place] [address] Tel. [phone]    Fax. [phone]   I, Wilburn Mylar, am acting as scribe for Dr. Virgie Dad. Anastasha Ortez.  I, Lurline Del MD, have reviewed the above documentation for accuracy and completeness, and I agree with the above.    *Total Encounter Time as defined by the Centers for Medicare and Medicaid Services includes, in addition to the face-to-face time of a patient visit (documented in the note above) non-face-to-face time: obtaining and reviewing outside history, ordering and reviewing medications, tests or procedures, care coordination (communications with other health care professionals or caregivers) and documentation in the medical record.

## 2019-06-27 ENCOUNTER — Inpatient Hospital Stay (HOSPITAL_BASED_OUTPATIENT_CLINIC_OR_DEPARTMENT_OTHER): Payer: Medicare Other | Admitting: Oncology

## 2019-06-27 ENCOUNTER — Encounter: Payer: Self-pay | Admitting: Oncology

## 2019-06-27 DIAGNOSIS — C50411 Malignant neoplasm of upper-outer quadrant of right female breast: Secondary | ICD-10-CM

## 2019-06-27 DIAGNOSIS — Z170 Estrogen receptor positive status [ER+]: Secondary | ICD-10-CM

## 2019-07-04 ENCOUNTER — Other Ambulatory Visit: Payer: Self-pay | Admitting: *Deleted

## 2019-07-04 DIAGNOSIS — Z170 Estrogen receptor positive status [ER+]: Secondary | ICD-10-CM

## 2019-07-04 DIAGNOSIS — C50411 Malignant neoplasm of upper-outer quadrant of right female breast: Secondary | ICD-10-CM

## 2019-07-04 NOTE — Progress Notes (Signed)
[facility]  Telephone:(336) [phone] Fax:(336) [phone]     ID: Diana Dunn DOB: 12-11-1945  MR#: [id number]  ZJI#:[id number]  Patient Care Team: Lawerance Cruel, MD as PCP - General Jovita Kussmaul, MD as Consulting Physician (General Surgery) Magrinat, Virgie Dad, MD as Consulting Physician (Oncology) Eppie Gibson, MD as Attending Physician (Radiation Oncology) Mauro Kaufmann, RN as Registered Nurse Rockwell Germany, RN as Registered Nurse Holley Bouche, NP (Inactive) as Nurse Practitioner (Nurse Practitioner) Lawerance Cruel, MD (Family Medicine) OTHER MD:  CHIEF COMPLAINT: Triple positive breast cancer  CURRENT TREATMENT:  tamoxifen   INTERVAL HISTORY: Diana Dunn returns today for follow up of her triple positive breast cancer.  She continues on tamoxifen.  She generally tolerates this remarkably well.  The only concern she has is sometimes she has what she calls streaks on her right upper arm and wonders if that could possibly be related.  Since her last visit, she underwent bilateral diagnostic mammography with tomography at [facility] on 12/09/2018 showing: breast density category B; no evidence of malignancy in either breast; right lumpectomy changes with benign dystrophic calcifications.    REVIEW OF SYSTEMS: Diana Dunn walks 40 to 45 minutes most days unless it is raining outside.  She also takes care of her grandson Gwyndolyn Saxon about 4 days a week.  She has had no unusual headaches visual changes cough phlegm production pleurisy shortness of breath or change in bowel or bladder habits.  She is taking appropriate pandemic precautions.  She has not been able to get a date for the vaccine yet.  A detailed review of systems today was otherwise stable.   BREAST CANCER HISTORY: From the original intake note:  Diana Dunn had not had a mammogram for approximately 4 years. Sometime mid 2015 she had an area of redness and tenderness in the right  breast and she brought this to her physician's attention. She was treated with antibiotics and this completely cleared.Marland Kitchen  Approximately mid April, she again developed redness over the right breast. This was very focal, and it did not look "as red" as the previous time. She brought it to Dr. Harrington Challenger is attention, and was treated with antibiotics. Bilateral mammography with tomosynthesis and right breast ultrasonography was also obtained on 09/19/2014. There was a focal opacity with irregular margins in the upper outer right breast with some central calcifications. There was mild architectural distortion associated with this. On exam Dr. [name] noted a firm palpable tender mass lateral to the right areola with overlying erythema. On ultrasonography there was an irregular hypoechoic mass measuring 2.4 cm.  Repeat right breast ultrasonography after the patient completed her antibiotic course was performed 10/04/2014. On exam there was still a palpable lobulated mass lateral to the right nipple and by ultrasound this measured 2.6 cm. It was felt to be a possible abscess and a second course of antibiotics, now with Septra, was undertaken. After that treatment, repeat ultrasonography 10/16/2014 continued to show the palpable mass and ultrasonography now found the mass to measure 2.9 cm maximally.  Accordingly biopsy of the mass in question was obtained that same day, 10/16/2014, and showed ([id number]) and invasive ductal carcinoma, grade 2 or 3, estrogen receptor 90% positive, progesterone receptor 80% positive, both with strong staining intensity, with an MIB-1 of 20%, and with HER-2 amplification, the signals ratio being 3.60 and the number per cell 4.50.  Bilateral breast MRI 10/23/2014 showed a breast density to be category B. In the right breast  there was an irregular enhancing mass at the 9:30 o'clock position measuring 3.1 cm. There were no additional worrisome masses in the right breast, no findings of  concern in the left breast, and no abnormal appearing lymph nodes.  The patient's subsequent history is as detailed below   PAST MEDICAL HISTORY: Past Medical History:  Diagnosis Date  . Breast cancer ([place])   . Breast cancer of upper-outer quadrant of right female breast ([facility]) 10/19/2014  . Cancer ([facility])    RIGHT BREAST CANCER  . History of hiatal hernia   . IBS (irritable bowel syndrome)   . Lactose intolerance   . Personal history of chemotherapy   . Personal history of radiation therapy     PAST SURGICAL HISTORY: Past Surgical History:  Procedure Laterality Date  . BREAST BIOPSY Right 10/16/2014  . BREAST LUMPECTOMY Right 03/28/2015  . BREAST SURGERY     BIOPSY OF BREAST  . COLONOSCOPY    . PORTACATH PLACEMENT Left 11/02/2014   Procedure: INSERTION PORT-A-CATH;  Surgeon: Autumn Messing III, MD;  Location: [facility];  Service: General;  Laterality: Left;  . RADIOACTIVE SEED GUIDED PARTIAL MASTECTOMY WITH AXILLARY SENTINEL LYMPH NODE BIOPSY Right 03/28/2015   Procedure: RADIOACTIVE SEED GUIDED PARTIAL MASTECTOMY WITH AXILLARY SENTINEL LYMPH NODE BIOPSY;  Surgeon: Autumn Messing III, MD;  Location: [facility];  Service: General;  Laterality: Right;    FAMILY HISTORY No family history on file. The patient's father died at the age of 53 from a myocardial infarction. The patient's mother died at the age of 66 following a stroke. The patient had 2 brothers. One had Down's syndrome. She had no sisters. The only breast cancer in the family was the patient's father's only sister was diagnosed with breast cancer in her 81s. There is no history of ovarian cancer in the family to the patient's knowledge    GYNECOLOGIC HISTORY:  No LMP recorded. Patient is postmenopausal.  menarche age 42, first live birth age 18, she is [place] P2. She went through the change of life in late 1999. She did not take hormone replacement. She took birth control pills for less than 2 years in her 52T, with no  complications    SOCIAL HISTORY:  Diana Dunn is my neighbor. 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 Bill died from widely metastatic malignant melanoma within 4 months of diagnosis some years ago. The patient lives with her son [name] and his wife, who had their first child late November 2016. Also [name], the dog, lives there. The other son, [name], lives in [place]. Both sons are appraiser's. The patient has 3 grandchildren. Gwyndolyn Saxon is the one in [place] (Neal's son).The patient attends [employer]    ADVANCED DIRECTIVES: Not in place. At the 10/24/2014 visit the patient was given the appropriate documents 2 complete and notarize at her discretion   HEALTH MAINTENANCE: Social History   Tobacco Use  . Smoking status: Former Smoker    Types: Cigarettes    Quit date: 08/03/2014    Years since quitting: 4.9  Substance Use Topics  . Alcohol use: Yes    Alcohol/week: 6.0 standard drinks    Types: 6 Glasses of wine per week  . Drug use: No     Colonoscopy: 2000?  PAP:  Bone density: On wrist, remote   Lipid panel:  Allergies  Allergen Reactions  . Aspirin     Stomach pain  . Aspirin Nausea  Only    GI upset/pain    Current Outpatient Medications  Medication Sig Dispense Refill  . cholecalciferol (VITAMIN D) 1000 UNITS tablet Take 1 tablet (1,000 Units total) by mouth daily.    . tamoxifen (NOLVADEX) 20 MG tablet TAKE 1 TABLET BY MOUTH EVERY DAY 90 tablet 0   No current facility-administered medications for this visit.    OBJECTIVE: middle-aged white woman who appears well  Vitals:   07/05/19 0920  BP: (!) 163/73  Pulse: 63  Resp: 18  Temp: 98.7 F (37.1 C)  SpO2: 99%     Body mass index is 23.26 kg/m.    Filed Weights   07/05/19 0920  Weight: 144 lb 1.6 oz (65.4 kg)   baseline weight 122 pounds  Sclerae unicteric, EOMs intact Wearing a mask No cervical or supraclavicular adenopathy Lungs no rales or  rhonchi Heart regular rate and rhythm Abd soft, nontender, positive bowel sounds MSK no focal spinal tenderness, no upper extremity lymphedema Neuro: nonfocal, well oriented, appropriate affect Breasts: The right breast is status post lumpectomy and radiation.  There is some scar particularly inferior to the nipple areolar complex and mild distortion of the contour but there is no evidence of disease recurrence locally.  Left breast is benign.  Both axillae are benign   LAB RESULTS:  CMP     Component Value Date/Time   NA 140 10/11/2017 1328   NA 142 10/08/2016 1319   K 4.6 10/11/2017 1328   K 4.0 10/08/2016 1319   CL 106 10/11/2017 1328   CO2 29 10/11/2017 1328   CO2 29 10/08/2016 1319   GLUCOSE 84 10/11/2017 1328   GLUCOSE 91 10/08/2016 1319   BUN 15 10/11/2017 1328   BUN 16.4 10/08/2016 1319   CREATININE 0.84 10/11/2017 1328   CREATININE 0.9 10/08/2016 1319   CALCIUM 9.5 10/11/2017 1328   CALCIUM 9.8 10/08/2016 1319   PROT 6.6 10/11/2017 1328   PROT 6.6 10/08/2016 1319   ALBUMIN 4.1 10/11/2017 1328   ALBUMIN 4.2 10/08/2016 1319   AST 15 10/11/2017 1328   AST 17 10/08/2016 1319   ALT 10 10/11/2017 1328   ALT 13 10/08/2016 1319   ALKPHOS 51 10/11/2017 1328   ALKPHOS 53 10/08/2016 1319   BILITOT 0.4 10/11/2017 1328   BILITOT 0.43 10/08/2016 1319   GFRNONAA >60 10/11/2017 1328   GFRAA >60 10/11/2017 1328    INo results found for: SPEP, UPEP  Lab Results  Component Value Date   WBC 4.2 07/05/2019   NEUTROABS 2.7 07/05/2019   HGB 14.3 07/05/2019   HCT 43.1 07/05/2019   MCV 96.0 07/05/2019   PLT 253 07/05/2019      Chemistry      Component Value Date/Time   NA 140 10/11/2017 1328   NA 142 10/08/2016 1319   K 4.6 10/11/2017 1328   K 4.0 10/08/2016 1319   CL 106 10/11/2017 1328   CO2 29 10/11/2017 1328   CO2 29 10/08/2016 1319   BUN 15 10/11/2017 1328   BUN 16.4 10/08/2016 1319   CREATININE 0.84 10/11/2017 1328   CREATININE 0.9 10/08/2016 1319       Component Value Date/Time   CALCIUM 9.5 10/11/2017 1328   CALCIUM 9.8 10/08/2016 1319   ALKPHOS 51 10/11/2017 1328   ALKPHOS 53 10/08/2016 1319   AST 15 10/11/2017 1328   AST 17 10/08/2016 1319   ALT 10 10/11/2017 1328   ALT 13 10/08/2016 1319   BILITOT 0.4 10/11/2017 1328  BILITOT 0.43 10/08/2016 1319      No results found for: LABCA2  No components found for: [id number]  No results for input(s): INR in the last 168 hours.  Urinalysis    Component Value Date/Time   LABSPEC 1.020 12/21/2014 1405   PHURINE 5.0 12/21/2014 1405   GLUCOSEU Negative 12/21/2014 1405   HGBUR Negative 12/21/2014 1405   BILIRUBINUR Negative 12/21/2014 1405   KETONESUR Negative 12/21/2014 1405   PROTEINUR < 30 12/21/2014 1405   UROBILINOGEN 0.2 12/21/2014 1405   NITRITE Negative 12/21/2014 1405   LEUKOCYTESUR Small 12/21/2014 1405    STUDIES: No results found.    ASSESSMENT: 74 y.o. [place] woman status post right breastUpper outer quadrant biopsy 10/16/2014 for a clinical T3 N0, stage IIA invasive ductal carcinoma, grade 2 or 3, estrogen and progesterone receptor positive, with an MIB-1 of 20%, and HER-2 amplified, with a signals ratio of 3.60  (1) chemotherapy and anti-HER-2 immunotherapy started 11/05/2014, consisting of carboplatin, docetaxel, trastuzumab and pertuzumab given every 21 days 6, with Neulasta support-- completed 02/28/2015  (2) trastuzumab continued to complete 1 year (last dose 11/08/2015)  (a) echocardiogram 09/06/2015 shows an ejection fraction of 60-65%  (3)  right lumpectomy and sentinel lymph node sampling 03/28/2015 showed a pT1c pN0 residual invasive ductal carcinoma, grade 2, with negative margins.  (4) adjuvant radiation 05/02/2015-06/14/2015  (5) tamoxifen started January 2017  (6) osteoporosis with T score -3.5 on bone density scan 09/20/2015   PLAN: Tanazia is now 4-1/2 years out from definitive surgery for her breast cancer with no evidence of disease  recurrence.  This is very favorable.  She continues on tamoxifen generally with good tolerance.  She will have mammography in June and I have entered that order.  She will continue on tamoxifen which I have refilled.  She will see me 1 last time a year from now and at that point she will be ready to "graduate".  I gave her some tips on how to try to get the COVID-19 vaccine a little sooner if possible  She knows to call for any other issue that may develop before the next visit.  Total encounter time 25 minutes.    Bijou Easler, Virgie Dad, MD  07/05/19 9:32 AM Medical Oncology and Hematology [facility] [place], [place] [address] Tel. [phone]    Fax. [phone]   I, Wilburn Mylar, am acting as scribe for Dr. Virgie Dad. Gurnoor Sloop.  I, Lurline Del MD, have reviewed the above documentation for accuracy and completeness, and I agree with the above.   *Total Encounter Time as defined by the Centers for Medicare and Medicaid Services includes, in addition to the face-to-face time of a patient visit (documented in the note above) non-face-to-face time: obtaining and reviewing outside history, ordering and reviewing medications, tests or procedures, care coordination (communications with other health care professionals or caregivers) and documentation in the medical record.

## 2019-07-05 ENCOUNTER — Other Ambulatory Visit: Payer: Self-pay

## 2019-07-05 ENCOUNTER — Inpatient Hospital Stay: Payer: Medicare Other

## 2019-07-05 ENCOUNTER — Inpatient Hospital Stay: Payer: Medicare Other | Attending: Oncology | Admitting: Oncology

## 2019-07-05 VITALS — BP 163/73 | HR 63 | Temp 98.7°F | Resp 18 | Ht 66.0 in | Wt 144.1 lb

## 2019-07-05 DIAGNOSIS — C50411 Malignant neoplasm of upper-outer quadrant of right female breast: Secondary | ICD-10-CM | POA: Diagnosis not present

## 2019-07-05 DIAGNOSIS — M818 Other osteoporosis without current pathological fracture: Secondary | ICD-10-CM | POA: Diagnosis not present

## 2019-07-05 DIAGNOSIS — Z170 Estrogen receptor positive status [ER+]: Secondary | ICD-10-CM

## 2019-07-05 DIAGNOSIS — Z87891 Personal history of nicotine dependence: Secondary | ICD-10-CM | POA: Insufficient documentation

## 2019-07-05 DIAGNOSIS — Z923 Personal history of irradiation: Secondary | ICD-10-CM | POA: Diagnosis not present

## 2019-07-05 DIAGNOSIS — Z79810 Long term (current) use of selective estrogen receptor modulators (SERMs): Secondary | ICD-10-CM | POA: Insufficient documentation

## 2019-07-05 LAB — CMP (CANCER CENTER ONLY)
ALT: 10 U/L (ref 0–44)
AST: 15 U/L (ref 15–41)
Albumin: 4.2 g/dL (ref 3.5–5.0)
Alkaline Phosphatase: 54 U/L (ref 38–126)
Anion gap: 9 (ref 5–15)
BUN: 18 mg/dL (ref 8–23)
CO2: 26 mmol/L (ref 22–32)
Calcium: 9 mg/dL (ref 8.9–10.3)
Chloride: 106 mmol/L (ref 98–111)
Creatinine: 0.92 mg/dL (ref 0.44–1.00)
GFR, Est AFR Am: >60 mL/min (ref >60)
GFR, Estimated: >60 mL/min (ref >60)
Glucose, Bld: 76 mg/dL (ref 70–99)
Potassium: 4.2 mmol/L (ref 3.5–5.1)
Sodium: 141 mmol/L (ref 135–145)
Total Bilirubin: 0.4 mg/dL (ref 0.3–1.2)
Total Protein: 6.8 g/dL (ref 6.5–8.1)

## 2019-07-05 LAB — CBC WITH DIFFERENTIAL (CANCER CENTER ONLY)
Abs Immature Granulocytes: 0.01 10*3/uL (ref 0.00–0.07)
Basophils Absolute: 0 10*3/uL (ref 0.0–0.1)
Basophils Relative: 1 %
Eosinophils Absolute: 0.1 10*3/uL (ref 0.0–0.5)
Eosinophils Relative: 2 %
HCT: 43.1 % (ref 36.0–46.0)
Hemoglobin: 14.3 g/dL (ref 12.0–15.0)
Immature Granulocytes: 0 %
Lymphocytes Relative: 26 %
Lymphs Abs: 1.1 10*3/uL (ref 0.7–4.0)
MCH: 31.8 pg (ref 26.0–34.0)
MCHC: 33.2 g/dL (ref 30.0–36.0)
MCV: 96 fL (ref 80.0–100.0)
Monocytes Absolute: 0.3 10*3/uL (ref 0.1–1.0)
Monocytes Relative: 8 %
Neutro Abs: 2.7 10*3/uL (ref 1.7–7.7)
Neutrophils Relative %: 63 %
Platelet Count: 253 10*3/uL (ref 150–400)
RBC: 4.49 MIL/uL (ref 3.87–5.11)
RDW: 11.9 % (ref 11.5–15.5)
WBC Count: 4.2 10*3/uL (ref 4.0–10.5)
nRBC: 0 % (ref 0.0–0.2)

## 2019-07-05 MED ORDER — TAMOXIFEN CITRATE 20 MG PO TABS: 20.0000 mg | ORAL_TABLET | Freq: Every day | ORAL | 4 refills | Status: DC

## 2019-07-24 ENCOUNTER — Ambulatory Visit: Payer: Medicare Other

## 2019-08-02 ENCOUNTER — Other Ambulatory Visit: Payer: Self-pay | Admitting: Oncology

## 2019-12-11 ENCOUNTER — Other Ambulatory Visit: Payer: Self-pay

## 2019-12-11 ENCOUNTER — Ambulatory Visit
Admission: RE | Admit: 2019-12-11 | Discharge: 2019-12-11 | Disposition: A | Discharge status: Home / Self Care | Payer: Medicare Other | Source: Ambulatory Visit | Attending: Oncology | Admitting: Oncology

## 2019-12-11 DIAGNOSIS — Z170 Estrogen receptor positive status [ER+]: Secondary | ICD-10-CM

## 2020-03-12 DIAGNOSIS — Z23 Encounter for immunization: Secondary | ICD-10-CM | POA: Diagnosis not present

## 2020-03-19 DIAGNOSIS — Z23 Encounter for immunization: Secondary | ICD-10-CM | POA: Diagnosis not present

## 2020-07-04 ENCOUNTER — Other Ambulatory Visit: Payer: Self-pay | Admitting: Oncology

## 2020-07-04 ENCOUNTER — Inpatient Hospital Stay: Payer: Medicare Other | Admitting: Oncology

## 2020-07-04 ENCOUNTER — Inpatient Hospital Stay: Payer: Medicare Other

## 2020-07-22 ENCOUNTER — Encounter: Payer: Self-pay | Admitting: Oncology

## 2020-07-22 NOTE — Progress Notes (Signed)
Diana Dunn  [phone] [phone]     ID: Diana Dunn DOB: January 27, 1946  MR#: [id number]  IOX#:[id number]  Patient Care Team: Lawerance Cruel, MD as PCP - General Jovita Kussmaul, MD as Consulting Physician (General Surgery) Saumya Hukill, Virgie Dad, MD as Consulting Physician (Oncology) Eppie Gibson, MD as Attending Physician (Radiation Oncology) Mauro Kaufmann, RN as Registered Nurse Rockwell Germany, RN as Registered Nurse Holley Bouche, NP (Inactive) as Nurse Practitioner (Nurse Practitioner) Lawerance Cruel, MD (Family Medicine) OTHER MD:  CHIEF COMPLAINT: Triple positive breast cancer  CURRENT TREATMENT: Completed 5 years of tamoxifen   INTERVAL HISTORY: Diana Dunn returns today for follow up of her triple positive breast cancer.  She completed her 5 years of tamoxifen in January 2022 and knowing that this was going to be her last visit she did not refill it.  Overall she tolerated tamoxifen quite well, with no unusual or persistent side effects  Since her last visit, she underwent bilateral diagnostic mammography with tomography at [facility] on 12/11/2019 showing: breast density category C; no evidence of malignancy in either breast; right posttreatment changes with benign lumpectomy bed fat necrosis.    REVIEW OF SYSTEMS: Diana Dunn walks daily for about 30 minutes unless the weather is very bad.  Generally feels really well.  A detailed review of systems is otherwise stable   COVID 19 VACCINATION STATUS: Status post Pfizer x2 with booster November 2021   BREAST CANCER HISTORY: From the original intake note:  Diana Dunn had not had a mammogram for approximately 4 years. Sometime mid 2015 she had an area of redness and tenderness in the right breast and she brought this to her physician's attention. She was treated with antibiotics and this completely cleared.Marland Kitchen  Approximately mid April, she again developed redness over the  right breast. This was very focal, and it did not look "as red" as the previous time. She brought it to Dr. Harrington Challenger is attention, and was treated with antibiotics. Bilateral mammography with tomosynthesis and right breast ultrasonography was also obtained on 09/19/2014. There was a focal opacity with irregular margins in the upper outer right breast with some central calcifications. There was mild architectural distortion associated with this. On exam Dr. [name] noted a firm palpable tender mass lateral to the right areola with overlying erythema. On ultrasonography there was an irregular hypoechoic mass measuring 2.4 cm.  Repeat right breast ultrasonography after the patient completed her antibiotic course was performed 10/04/2014. On exam there was still a palpable lobulated mass lateral to the right nipple and by ultrasound this measured 2.6 cm. It was felt to be a possible abscess and a second course of antibiotics, now with Septra, was undertaken. After that treatment, repeat ultrasonography 10/16/2014 continued to show the palpable mass and ultrasonography now found the mass to measure 2.9 cm maximally.  Accordingly biopsy of the mass in question was obtained that same day, 10/16/2014, and showed ([id number]) and invasive ductal carcinoma, grade 2 or 3, estrogen receptor 90% positive, progesterone receptor 80% positive, both with strong staining intensity, with an MIB-1 of 20%, and with HER-2 amplification, the signals ratio being 3.60 and the number per cell 4.50.  Bilateral breast MRI 10/23/2014 showed a breast density to be category B. In the right breast there was an irregular enhancing mass at the 9:30 o'clock position measuring 3.1 cm. There were no additional worrisome masses in the right breast, no findings of concern in the left breast, and  no abnormal appearing lymph nodes.  The patient's subsequent history is as detailed below   PAST MEDICAL HISTORY: Past Medical History:  Diagnosis Date   . Breast cancer ([place])   . Breast cancer of upper-outer quadrant of right female breast ([place]) 10/19/2014  . Cancer ([place])    RIGHT BREAST CANCER  . History of hiatal hernia   . IBS (irritable bowel syndrome)   . Lactose intolerance   . Personal history of chemotherapy   . Personal history of radiation therapy     PAST SURGICAL HISTORY: Past Surgical History:  Procedure Laterality Date  . BREAST BIOPSY Right 10/16/2014  . BREAST LUMPECTOMY Right 03/28/2015  . BREAST SURGERY     BIOPSY OF BREAST  . COLONOSCOPY    . PORTACATH PLACEMENT Left 11/02/2014   Procedure: INSERTION PORT-A-CATH;  Surgeon: Autumn Messing III, MD;  Location: [facility];  Service: General;  Laterality: Left;  . RADIOACTIVE SEED GUIDED PARTIAL MASTECTOMY WITH AXILLARY SENTINEL LYMPH NODE BIOPSY Right 03/28/2015   Procedure: RADIOACTIVE SEED GUIDED PARTIAL MASTECTOMY WITH AXILLARY SENTINEL LYMPH NODE BIOPSY;  Surgeon: Autumn Messing III, MD;  Location: [facility];  Service: General;  Laterality: Right;    FAMILY HISTORY Family History  Problem Relation Age of Onset  . Stroke Mother   . Heart attack Father   . Down syndrome Brother   . Breast cancer Paternal Aunt        dx in 63's  . Ovarian cancer Neg Hx   The patient's father died at the age of 83 from a myocardial infarction. The patient's mother died at the age of 65 following a stroke. The patient had 2 brothers. One had Down's syndrome. She had no sisters. The only breast cancer in the family was the patient's father's only sister was diagnosed with breast cancer in her 60s. There is no history of ovarian cancer in the family to the patient's knowledge    GYNECOLOGIC HISTORY:  No LMP recorded. Patient is postmenopausal.  menarche age 82, first live birth age 36, she is [place] P2. She went through the change of life in late 1999. She did not take hormone replacement. She took birth control pills for less than 2 years in her 76V, with no complications    SOCIAL  HISTORY:  Diana Dunn is my neighbor. 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 Bill died from widely metastatic malignant melanoma within 4 months of diagnosis many years ago. The patient lives with her [name] and his wife Diana Dunn, who had their first child, Diana Dunn, late November 2016. The other son, Diana Dunn, lives in [place]. Both sons are appraiser's. The patient has 3 grandchildren. Diana Dunn is the one in [place] ([place]'s son).The patient attends [employer]    ADVANCED DIRECTIVES: Not in place. At the 10/24/2014 visit the patient was given the appropriate documents 2 complete and notarize at her discretion   HEALTH MAINTENANCE: Social History   Tobacco Use  . Smoking status: Former Smoker    Types: Cigarettes    Quit date: 08/03/2014    Years since quitting: 5.9  Substance Use Topics  . Alcohol use: Yes    Alcohol/week: 6.0 standard drinks    Types: 6 Glasses of wine per week  . Drug use: No     Colonoscopy: 2000?  PAP:  Bone density: On wrist, remote   Lipid panel:  Allergies  Allergen Reactions  . Aspirin  Stomach pain  . Aspirin Nausea Only    GI upset/pain    Current Outpatient Medications  Medication Sig Dispense Refill  . cholecalciferol (VITAMIN D) 1000 UNITS tablet Take 1 tablet (1,000 Units total) by mouth daily.    . tamoxifen (NOLVADEX) 20 MG tablet TAKE 1 TABLET BY MOUTH EVERY DAY 90 tablet 4   No current facility-administered medications for this visit.    OBJECTIVE: middle-aged white woman in no acute distress  Vitals:   07/23/20 1116  BP: (!) 142/70  Pulse: 64  Resp: 17  Temp: 97.7 F (36.5 C)  SpO2: 98%     Body mass index is 23 kg/m.    Filed Weights   07/23/20 1116  Weight: 142 lb 8 oz (64.6 kg)    Sclerae unicteric, EOMs intact Wearing a mask No cervical or supraclavicular adenopathy Lungs no rales or rhonchi Heart regular rate and rhythm Abd soft, nontender, positive  bowel sounds MSK no focal spinal tenderness, no upper extremity lymphedema Neuro: nonfocal, well oriented, appropriate affect Breasts: The right breast has undergone lumpectomy and radiation.  There is no evidence of local recurrence.  There is scar inferiorly to the nipple complex and some distortion of the contour as expected.  Left breast and both axillae are benign.   LAB RESULTS:  CMP     Component Value Date/Time   NA 139 07/23/2020 1021   NA 142 10/08/2016 1319   K 4.0 07/23/2020 1021   K 4.0 10/08/2016 1319   CL 107 07/23/2020 1021   CO2 25 07/23/2020 1021   CO2 29 10/08/2016 1319   GLUCOSE 82 07/23/2020 1021   GLUCOSE 91 10/08/2016 1319   BUN 15 07/23/2020 1021   BUN 16.4 10/08/2016 1319   CREATININE 0.97 07/23/2020 1021   CREATININE 0.92 07/05/2019 0900   CREATININE 0.9 10/08/2016 1319   CALCIUM 9.1 07/23/2020 1021   CALCIUM 9.8 10/08/2016 1319   PROT 6.8 07/23/2020 1021   PROT 6.6 10/08/2016 1319   ALBUMIN 4.1 07/23/2020 1021   ALBUMIN 4.2 10/08/2016 1319   AST 15 07/23/2020 1021   AST 15 07/05/2019 0900   AST 17 10/08/2016 1319   ALT 11 07/23/2020 1021   ALT 10 07/05/2019 0900   ALT 13 10/08/2016 1319   ALKPHOS 56 07/23/2020 1021   ALKPHOS 53 10/08/2016 1319   BILITOT 0.5 07/23/2020 1021   BILITOT 0.4 07/05/2019 0900   BILITOT 0.43 10/08/2016 1319   GFRNONAA >60 07/23/2020 1021   GFRNONAA >60 07/05/2019 0900   GFRAA >60 07/05/2019 0900    INo results found for: SPEP, UPEP  Lab Results  Component Value Date   WBC 5.0 07/23/2020   NEUTROABS 3.0 07/23/2020   HGB 14.3 07/23/2020   HCT 43.7 07/23/2020   MCV 93.6 07/23/2020   PLT 217 07/23/2020      Chemistry      Component Value Date/Time   NA 139 07/23/2020 1021   NA 142 10/08/2016 1319   K 4.0 07/23/2020 1021   K 4.0 10/08/2016 1319   CL 107 07/23/2020 1021   CO2 25 07/23/2020 1021   CO2 29 10/08/2016 1319   BUN 15 07/23/2020 1021   BUN 16.4 10/08/2016 1319   CREATININE 0.97 07/23/2020  1021   CREATININE 0.92 07/05/2019 0900   CREATININE 0.9 10/08/2016 1319      Component Value Date/Time   CALCIUM 9.1 07/23/2020 1021   CALCIUM 9.8 10/08/2016 1319   ALKPHOS 56 07/23/2020 1021   ALKPHOS 53  10/08/2016 1319   AST 15 07/23/2020 1021   AST 15 07/05/2019 0900   AST 17 10/08/2016 1319   ALT 11 07/23/2020 1021   ALT 10 07/05/2019 0900   ALT 13 10/08/2016 1319   BILITOT 0.5 07/23/2020 1021   BILITOT 0.4 07/05/2019 0900   BILITOT 0.43 10/08/2016 1319      No results found for: LABCA2  No components found for: LABCA125  No results for input(s): INR in the last 168 hours.  Urinalysis    Component Value Date/Time   LABSPEC 1.020 12/21/2014 1405   PHURINE 5.0 12/21/2014 1405   GLUCOSEU Negative 12/21/2014 1405   HGBUR Negative 12/21/2014 1405   BILIRUBINUR Negative 12/21/2014 1405   KETONESUR Negative 12/21/2014 1405   PROTEINUR < 30 12/21/2014 1405   UROBILINOGEN 0.2 12/21/2014 1405   NITRITE Negative 12/21/2014 1405   LEUKOCYTESUR Small 12/21/2014 1405    STUDIES: No results found.    ASSESSMENT: 75 y.o. [place] woman status post right breastUpper outer quadrant biopsy 10/16/2014 for a clinical T3 N0, stage IIA invasive ductal carcinoma, grade 2 or 3, estrogen and progesterone receptor positive, with an MIB-1 of 20%, and HER-2 amplified, with a signals ratio of 3.60  (1) chemotherapy and anti-HER-2 immunotherapy started 11/05/2014, consisting of carboplatin, docetaxel, trastuzumab and pertuzumab given every 21 days 6, with Neulasta support-- completed 02/28/2015  (2) trastuzumab continued to complete 1 year (last dose 11/08/2015)  (a) echocardiogram 09/06/2015 shows an ejection fraction of 60-65%  (3)  right lumpectomy and sentinel lymph node sampling 03/28/2015 showed a pT1c pN0 residual invasive ductal carcinoma, grade 2, with negative margins.  (4) adjuvant radiation 05/02/2015-06/14/2015  (5) tamoxifen started January 2017,completing 5 years  January 2022  (6) osteoporosis with T score -3.5 on bone density scan 09/20/2015   PLAN: Diana Dunn is now 5-1/2 years out from definitive surgery for her breast cancer with no evidence of disease recurrence.  This is very favorable.  She completed 5 years of tamoxifen.  I do not see any significant advantage in her continuing an additional 5 years and accordingly we have stopped that medication  At this point I feel comfortable releasing her to her primary care physician's care.  All she will need in terms of breast cancer follow-up is her yearly mammography and a yearly physician breast exam  I will be glad to see Diana Dunn again at any point in the future if and when the need arises but as of now are making no further routine appointments for her here.  Total encounter time 20 minutes.    Diana Dunn, Virgie Dad, MD  07/23/20 7:15 PM Medical Oncology and Hematology [facility] [place],  [address] Tel. [phone]    Fax. [phone]   I, Wilburn Mylar, am acting as scribe for Dr. Virgie Dad. Diana Dunn.  I, Lurline Del MD, have reviewed the above documentation for accuracy and completeness, and I agree with the above.   *Total Encounter Time as defined by the Centers for Medicare and Medicaid Services includes, in addition to the face-to-face time of a patient visit (documented in the note above) non-face-to-face time: obtaining and reviewing outside history, ordering and reviewing medications, tests or procedures, care coordination (communications with other health care professionals or caregivers) and documentation in the medical record.

## 2020-07-23 ENCOUNTER — Inpatient Hospital Stay (HOSPITAL_BASED_OUTPATIENT_CLINIC_OR_DEPARTMENT_OTHER): Payer: Medicare Other | Admitting: Oncology

## 2020-07-23 ENCOUNTER — Other Ambulatory Visit: Payer: Self-pay

## 2020-07-23 ENCOUNTER — Inpatient Hospital Stay: Payer: Medicare Other | Attending: Oncology

## 2020-07-23 VITALS — BP 142/70 | HR 64 | Temp 97.7°F | Resp 17 | Ht 66.0 in | Wt 142.5 lb

## 2020-07-23 DIAGNOSIS — Z170 Estrogen receptor positive status [ER+]: Secondary | ICD-10-CM

## 2020-07-23 DIAGNOSIS — Z9221 Personal history of antineoplastic chemotherapy: Secondary | ICD-10-CM | POA: Insufficient documentation

## 2020-07-23 DIAGNOSIS — Z803 Family history of malignant neoplasm of breast: Secondary | ICD-10-CM | POA: Insufficient documentation

## 2020-07-23 DIAGNOSIS — Z9011 Acquired absence of right breast and nipple: Secondary | ICD-10-CM | POA: Insufficient documentation

## 2020-07-23 DIAGNOSIS — Z780 Asymptomatic menopausal state: Secondary | ICD-10-CM | POA: Diagnosis not present

## 2020-07-23 DIAGNOSIS — Z87891 Personal history of nicotine dependence: Secondary | ICD-10-CM | POA: Diagnosis not present

## 2020-07-23 DIAGNOSIS — C50411 Malignant neoplasm of upper-outer quadrant of right female breast: Secondary | ICD-10-CM | POA: Diagnosis not present

## 2020-07-23 DIAGNOSIS — Z853 Personal history of malignant neoplasm of breast: Secondary | ICD-10-CM | POA: Diagnosis not present

## 2020-07-23 DIAGNOSIS — Z923 Personal history of irradiation: Secondary | ICD-10-CM | POA: Insufficient documentation

## 2020-07-23 LAB — CBC WITH DIFFERENTIAL/PLATELET
Abs Immature Granulocytes: 0.01 10*3/uL (ref 0.00–0.07)
Basophils Absolute: 0 10*3/uL (ref 0.0–0.1)
Basophils Relative: 1 %
Eosinophils Absolute: 0.1 10*3/uL (ref 0.0–0.5)
Eosinophils Relative: 2 %
HCT: 43.7 % (ref 36.0–46.0)
Hemoglobin: 14.3 g/dL (ref 12.0–15.0)
Immature Granulocytes: 0 %
Lymphocytes Relative: 29 %
Lymphs Abs: 1.4 10*3/uL (ref 0.7–4.0)
MCH: 30.6 pg (ref 26.0–34.0)
MCHC: 32.7 g/dL (ref 30.0–36.0)
MCV: 93.6 fL (ref 80.0–100.0)
Monocytes Absolute: 0.4 10*3/uL (ref 0.1–1.0)
Monocytes Relative: 8 %
Neutro Abs: 3 10*3/uL (ref 1.7–7.7)
Neutrophils Relative %: 60 %
Platelets: 217 10*3/uL (ref 150–400)
RBC: 4.67 MIL/uL (ref 3.87–5.11)
RDW: 12 % (ref 11.5–15.5)
WBC: 5 10*3/uL (ref 4.0–10.5)
nRBC: 0 % (ref 0.0–0.2)

## 2020-07-23 LAB — COMPREHENSIVE METABOLIC PANEL
ALT: 11 U/L (ref 0–44)
AST: 15 U/L (ref 15–41)
Albumin: 4.1 g/dL (ref 3.5–5.0)
Alkaline Phosphatase: 56 U/L (ref 38–126)
Anion gap: 7 (ref 5–15)
BUN: 15 mg/dL (ref 8–23)
CO2: 25 mmol/L (ref 22–32)
Calcium: 9.1 mg/dL (ref 8.9–10.3)
Chloride: 107 mmol/L (ref 98–111)
Creatinine, Ser: 0.97 mg/dL (ref 0.44–1.00)
GFR, Estimated: >60 mL/min (ref >60)
Glucose, Bld: 82 mg/dL (ref 70–99)
Potassium: 4 mmol/L (ref 3.5–5.1)
Sodium: 139 mmol/L (ref 135–145)
Total Bilirubin: 0.5 mg/dL (ref 0.3–1.2)
Total Protein: 6.8 g/dL (ref 6.5–8.1)

## 2020-07-24 ENCOUNTER — Telehealth: Payer: Self-pay | Admitting: Oncology

## 2020-07-24 NOTE — Telephone Encounter (Signed)
No 2/8 los. No changes made to pt's schedule.  

## 2020-10-01 DIAGNOSIS — L03116 Cellulitis of left lower limb: Secondary | ICD-10-CM | POA: Diagnosis not present

## 2020-10-04 DIAGNOSIS — L03116 Cellulitis of left lower limb: Secondary | ICD-10-CM | POA: Diagnosis not present

## 2020-12-02 ENCOUNTER — Other Ambulatory Visit: Payer: Self-pay | Admitting: Oncology

## 2020-12-02 DIAGNOSIS — Z1231 Encounter for screening mammogram for malignant neoplasm of breast: Secondary | ICD-10-CM

## 2020-12-11 ENCOUNTER — Inpatient Hospital Stay: Admission: RE | Admit: 2020-12-11 | Payer: Medicare Other | Source: Ambulatory Visit

## 2021-03-28 DIAGNOSIS — Z23 Encounter for immunization: Secondary | ICD-10-CM | POA: Diagnosis not present

## 2021-07-02 DIAGNOSIS — H25813 Combined forms of age-related cataract, bilateral: Secondary | ICD-10-CM | POA: Diagnosis not present

## 2021-07-02 DIAGNOSIS — H5213 Myopia, bilateral: Secondary | ICD-10-CM | POA: Diagnosis not present

## 2021-07-02 DIAGNOSIS — H43812 Vitreous degeneration, left eye: Secondary | ICD-10-CM | POA: Diagnosis not present

## 2021-08-13 DIAGNOSIS — H25013 Cortical age-related cataract, bilateral: Secondary | ICD-10-CM | POA: Diagnosis not present

## 2021-08-13 DIAGNOSIS — H25043 Posterior subcapsular polar age-related cataract, bilateral: Secondary | ICD-10-CM | POA: Diagnosis not present

## 2021-08-13 DIAGNOSIS — H2513 Age-related nuclear cataract, bilateral: Secondary | ICD-10-CM | POA: Diagnosis not present

## 2021-09-09 DIAGNOSIS — H2511 Age-related nuclear cataract, right eye: Secondary | ICD-10-CM | POA: Diagnosis not present

## 2021-09-09 DIAGNOSIS — H25811 Combined forms of age-related cataract, right eye: Secondary | ICD-10-CM | POA: Diagnosis not present

## 2021-09-09 DIAGNOSIS — H52201 Unspecified astigmatism, right eye: Secondary | ICD-10-CM | POA: Diagnosis not present

## 2021-09-09 DIAGNOSIS — H25041 Posterior subcapsular polar age-related cataract, right eye: Secondary | ICD-10-CM | POA: Diagnosis not present

## 2021-09-12 DIAGNOSIS — H353112 Nonexudative age-related macular degeneration, right eye, intermediate dry stage: Secondary | ICD-10-CM | POA: Diagnosis not present

## 2021-09-12 DIAGNOSIS — H4419 Other endophthalmitis: Secondary | ICD-10-CM | POA: Diagnosis not present

## 2021-09-12 DIAGNOSIS — H2000 Unspecified acute and subacute iridocyclitis: Secondary | ICD-10-CM | POA: Diagnosis not present

## 2021-09-12 DIAGNOSIS — H35362 Drusen (degenerative) of macula, left eye: Secondary | ICD-10-CM | POA: Diagnosis not present

## 2021-09-12 DIAGNOSIS — H2512 Age-related nuclear cataract, left eye: Secondary | ICD-10-CM | POA: Diagnosis not present

## 2021-09-13 DIAGNOSIS — H4419 Other endophthalmitis: Secondary | ICD-10-CM | POA: Diagnosis not present

## 2021-09-13 DIAGNOSIS — H43391 Other vitreous opacities, right eye: Secondary | ICD-10-CM | POA: Diagnosis not present

## 2021-09-15 DIAGNOSIS — H4419 Other endophthalmitis: Secondary | ICD-10-CM | POA: Diagnosis not present

## 2021-09-15 DIAGNOSIS — H43391 Other vitreous opacities, right eye: Secondary | ICD-10-CM | POA: Diagnosis not present

## 2021-09-17 DIAGNOSIS — H43391 Other vitreous opacities, right eye: Secondary | ICD-10-CM | POA: Diagnosis not present

## 2021-09-17 DIAGNOSIS — H4419 Other endophthalmitis: Secondary | ICD-10-CM | POA: Diagnosis not present

## 2021-09-19 DIAGNOSIS — H43391 Other vitreous opacities, right eye: Secondary | ICD-10-CM | POA: Diagnosis not present

## 2021-09-19 DIAGNOSIS — H4419 Other endophthalmitis: Secondary | ICD-10-CM | POA: Diagnosis not present

## 2021-09-22 DIAGNOSIS — H3561 Retinal hemorrhage, right eye: Secondary | ICD-10-CM | POA: Diagnosis not present

## 2021-09-22 DIAGNOSIS — H43311 Vitreous membranes and strands, right eye: Secondary | ICD-10-CM | POA: Diagnosis not present

## 2021-09-22 DIAGNOSIS — H26491 Other secondary cataract, right eye: Secondary | ICD-10-CM | POA: Diagnosis not present

## 2021-09-22 DIAGNOSIS — H3581 Retinal edema: Secondary | ICD-10-CM | POA: Diagnosis not present

## 2021-09-22 DIAGNOSIS — H43391 Other vitreous opacities, right eye: Secondary | ICD-10-CM | POA: Diagnosis not present

## 2021-10-24 DIAGNOSIS — H43823 Vitreomacular adhesion, bilateral: Secondary | ICD-10-CM | POA: Diagnosis not present

## 2021-10-24 DIAGNOSIS — H35371 Puckering of macula, right eye: Secondary | ICD-10-CM | POA: Diagnosis not present

## 2021-10-24 DIAGNOSIS — H4419 Other endophthalmitis: Secondary | ICD-10-CM | POA: Diagnosis not present

## 2022-04-02 DIAGNOSIS — Z23 Encounter for immunization: Secondary | ICD-10-CM | POA: Diagnosis not present

## 2022-04-10 DIAGNOSIS — H00012 Hordeolum externum right lower eyelid: Secondary | ICD-10-CM | POA: Diagnosis not present

## 2023-04-08 DIAGNOSIS — Z23 Encounter for immunization: Secondary | ICD-10-CM | POA: Diagnosis not present
